# Patient Record
Sex: Female | Born: 1950 | Race: White | Hispanic: No | Marital: Married | Smoking: Former smoker
Health system: Southern US, Community
[De-identification: ages and names within clinical notes are randomized; demographics above are authoritative.]

## PROBLEM LIST (undated history)

## (undated) DIAGNOSIS — M8580 Other specified disorders of bone density and structure, unspecified site: Secondary | ICD-10-CM

## (undated) DIAGNOSIS — C50919 Malignant neoplasm of unspecified site of unspecified female breast: Secondary | ICD-10-CM

## (undated) DIAGNOSIS — I10 Essential (primary) hypertension: Secondary | ICD-10-CM

## (undated) DIAGNOSIS — C4491 Basal cell carcinoma of skin, unspecified: Secondary | ICD-10-CM

## (undated) DIAGNOSIS — E785 Hyperlipidemia, unspecified: Secondary | ICD-10-CM

## (undated) DIAGNOSIS — R011 Cardiac murmur, unspecified: Secondary | ICD-10-CM

## (undated) DIAGNOSIS — I2510 Atherosclerotic heart disease of native coronary artery without angina pectoris: Secondary | ICD-10-CM

## (undated) DIAGNOSIS — M47816 Spondylosis without myelopathy or radiculopathy, lumbar region: Secondary | ICD-10-CM

## (undated) HISTORY — PX: TONSILLECTOMY: SUR1361

## (undated) HISTORY — PX: ROTATOR CUFF REPAIR: SHX139

## (undated) HISTORY — DX: Basal cell carcinoma of skin, unspecified: C44.91

## (undated) HISTORY — PX: COLONOSCOPY: SHX174

## (undated) HISTORY — DX: Malignant neoplasm of unspecified site of unspecified female breast: C50.919

## (undated) HISTORY — DX: Spondylosis without myelopathy or radiculopathy, lumbar region: M47.816

## (undated) HISTORY — DX: Other specified disorders of bone density and structure, unspecified site: M85.80

## (undated) HISTORY — DX: Hyperlipidemia, unspecified: E78.5

## (undated) HISTORY — DX: Atherosclerotic heart disease of native coronary artery without angina pectoris: I25.10

## (undated) HISTORY — PX: CHOLECYSTECTOMY: SHX55

## (undated) HISTORY — DX: Essential (primary) hypertension: I10

## (undated) HISTORY — DX: Cardiac murmur, unspecified: R01.1

## (undated) HISTORY — PX: WISDOM TOOTH EXTRACTION: SHX21

---

## 1998-03-13 ENCOUNTER — Other Ambulatory Visit: Admission: RE | Admit: 1998-03-13 | Discharge: 1998-03-13 | Payer: Self-pay | Admitting: Obstetrics and Gynecology

## 1998-07-08 ENCOUNTER — Encounter: Payer: Self-pay | Admitting: Emergency Medicine

## 1998-07-08 ENCOUNTER — Encounter: Payer: Self-pay | Admitting: General Surgery

## 1998-07-09 ENCOUNTER — Inpatient Hospital Stay (HOSPITAL_COMMUNITY): Admission: EM | Admit: 1998-07-09 | Discharge: 1998-07-10 | Payer: Self-pay | Admitting: Emergency Medicine

## 1999-06-11 ENCOUNTER — Other Ambulatory Visit: Admission: RE | Admit: 1999-06-11 | Discharge: 1999-06-11 | Payer: Self-pay | Admitting: Obstetrics and Gynecology

## 2000-08-13 ENCOUNTER — Other Ambulatory Visit: Admission: RE | Admit: 2000-08-13 | Discharge: 2000-08-13 | Payer: Self-pay | Admitting: Obstetrics and Gynecology

## 2000-12-18 ENCOUNTER — Observation Stay (HOSPITAL_COMMUNITY): Admission: EM | Admit: 2000-12-18 | Discharge: 2000-12-19 | Payer: Self-pay | Admitting: Emergency Medicine

## 2000-12-18 ENCOUNTER — Encounter: Payer: Self-pay | Admitting: Emergency Medicine

## 2001-09-22 ENCOUNTER — Other Ambulatory Visit: Admission: RE | Admit: 2001-09-22 | Discharge: 2001-09-22 | Payer: Self-pay | Admitting: Obstetrics and Gynecology

## 2001-10-02 ENCOUNTER — Ambulatory Visit (HOSPITAL_COMMUNITY): Admission: RE | Admit: 2001-10-02 | Discharge: 2001-10-02 | Payer: Self-pay | Admitting: Orthopedic Surgery

## 2001-10-02 ENCOUNTER — Encounter: Payer: Self-pay | Admitting: Orthopedic Surgery

## 2001-12-11 ENCOUNTER — Encounter: Payer: Self-pay | Admitting: Orthopedic Surgery

## 2001-12-11 ENCOUNTER — Ambulatory Visit (HOSPITAL_COMMUNITY): Admission: RE | Admit: 2001-12-11 | Discharge: 2001-12-11 | Payer: Self-pay | Admitting: Orthopedic Surgery

## 2002-09-28 ENCOUNTER — Other Ambulatory Visit: Admission: RE | Admit: 2002-09-28 | Discharge: 2002-09-28 | Payer: Self-pay | Admitting: Obstetrics and Gynecology

## 2002-11-23 ENCOUNTER — Encounter: Payer: Self-pay | Admitting: Orthopedic Surgery

## 2002-11-23 ENCOUNTER — Ambulatory Visit (HOSPITAL_COMMUNITY): Admission: RE | Admit: 2002-11-23 | Discharge: 2002-11-23 | Payer: Self-pay | Admitting: Orthopedic Surgery

## 2003-10-14 ENCOUNTER — Other Ambulatory Visit: Admission: RE | Admit: 2003-10-14 | Discharge: 2003-10-14 | Payer: Self-pay | Admitting: Obstetrics and Gynecology

## 2004-08-08 ENCOUNTER — Ambulatory Visit: Payer: Self-pay | Admitting: Internal Medicine

## 2004-09-04 ENCOUNTER — Ambulatory Visit: Payer: Self-pay | Admitting: Internal Medicine

## 2004-09-17 ENCOUNTER — Ambulatory Visit: Payer: Self-pay | Admitting: Internal Medicine

## 2004-10-23 ENCOUNTER — Other Ambulatory Visit: Admission: RE | Admit: 2004-10-23 | Discharge: 2004-10-23 | Payer: Self-pay | Admitting: Obstetrics and Gynecology

## 2004-11-03 HISTORY — PX: MASTECTOMY: SHX3

## 2004-11-10 ENCOUNTER — Encounter: Admission: RE | Admit: 2004-11-10 | Discharge: 2004-11-10 | Payer: Self-pay | Admitting: Radiology

## 2004-11-30 ENCOUNTER — Encounter: Admission: RE | Admit: 2004-11-30 | Discharge: 2004-11-30 | Payer: Self-pay | Admitting: General Surgery

## 2004-12-03 ENCOUNTER — Ambulatory Visit (HOSPITAL_COMMUNITY): Admission: RE | Admit: 2004-12-03 | Discharge: 2004-12-03 | Payer: Self-pay | Admitting: General Surgery

## 2004-12-03 ENCOUNTER — Ambulatory Visit (HOSPITAL_BASED_OUTPATIENT_CLINIC_OR_DEPARTMENT_OTHER): Admission: RE | Admit: 2004-12-03 | Discharge: 2004-12-04 | Payer: Self-pay | Admitting: General Surgery

## 2004-12-03 ENCOUNTER — Encounter (INDEPENDENT_AMBULATORY_CARE_PROVIDER_SITE_OTHER): Payer: Self-pay | Admitting: Specialist

## 2004-12-05 ENCOUNTER — Ambulatory Visit: Payer: Self-pay | Admitting: Oncology

## 2005-03-06 ENCOUNTER — Ambulatory Visit: Payer: Self-pay | Admitting: Oncology

## 2005-05-17 ENCOUNTER — Ambulatory Visit (HOSPITAL_BASED_OUTPATIENT_CLINIC_OR_DEPARTMENT_OTHER): Admission: RE | Admit: 2005-05-17 | Discharge: 2005-05-17 | Payer: Self-pay | Admitting: Plastic Surgery

## 2005-07-09 ENCOUNTER — Ambulatory Visit: Payer: Self-pay | Admitting: Internal Medicine

## 2005-10-24 ENCOUNTER — Other Ambulatory Visit: Admission: RE | Admit: 2005-10-24 | Discharge: 2005-10-24 | Payer: Self-pay | Admitting: Obstetrics and Gynecology

## 2005-11-14 ENCOUNTER — Ambulatory Visit: Payer: Self-pay | Admitting: Internal Medicine

## 2006-09-18 DIAGNOSIS — I10 Essential (primary) hypertension: Secondary | ICD-10-CM

## 2006-09-22 ENCOUNTER — Ambulatory Visit: Payer: Self-pay | Admitting: Internal Medicine

## 2006-09-22 ENCOUNTER — Encounter: Payer: Self-pay | Admitting: Internal Medicine

## 2006-10-20 ENCOUNTER — Encounter: Payer: Self-pay | Admitting: Internal Medicine

## 2006-10-20 ENCOUNTER — Ambulatory Visit: Payer: Self-pay | Admitting: Cardiology

## 2006-10-27 ENCOUNTER — Other Ambulatory Visit: Admission: RE | Admit: 2006-10-27 | Discharge: 2006-10-27 | Payer: Self-pay | Admitting: Obstetrics and Gynecology

## 2006-10-29 ENCOUNTER — Encounter (INDEPENDENT_AMBULATORY_CARE_PROVIDER_SITE_OTHER): Payer: Self-pay | Admitting: *Deleted

## 2006-11-10 ENCOUNTER — Encounter: Payer: Self-pay | Admitting: Internal Medicine

## 2006-11-10 ENCOUNTER — Ambulatory Visit: Payer: Self-pay

## 2006-11-10 ENCOUNTER — Encounter: Payer: Self-pay | Admitting: Cardiology

## 2006-12-12 ENCOUNTER — Ambulatory Visit: Payer: Self-pay | Admitting: Cardiology

## 2006-12-12 LAB — CONVERTED CEMR LAB
ALT: 35 units/L (ref 0–35)
AST: 37 units/L (ref 0–37)
Bilirubin, Direct: 0.1 mg/dL (ref 0.0–0.3)
Total Bilirubin: 0.8 mg/dL (ref 0.3–1.2)
Total Protein: 6.6 g/dL (ref 6.0–8.3)

## 2006-12-23 ENCOUNTER — Ambulatory Visit: Payer: Self-pay | Admitting: Cardiology

## 2007-01-23 ENCOUNTER — Ambulatory Visit: Payer: Self-pay | Admitting: Internal Medicine

## 2007-01-30 ENCOUNTER — Telehealth (INDEPENDENT_AMBULATORY_CARE_PROVIDER_SITE_OTHER): Payer: Self-pay | Admitting: *Deleted

## 2007-02-02 ENCOUNTER — Ambulatory Visit: Payer: Self-pay | Admitting: Internal Medicine

## 2007-02-03 ENCOUNTER — Encounter (INDEPENDENT_AMBULATORY_CARE_PROVIDER_SITE_OTHER): Payer: Self-pay | Admitting: Family Medicine

## 2007-10-21 ENCOUNTER — Telehealth (INDEPENDENT_AMBULATORY_CARE_PROVIDER_SITE_OTHER): Payer: Self-pay | Admitting: *Deleted

## 2007-10-23 ENCOUNTER — Encounter: Payer: Self-pay | Admitting: Internal Medicine

## 2007-10-29 ENCOUNTER — Telehealth (INDEPENDENT_AMBULATORY_CARE_PROVIDER_SITE_OTHER): Payer: Self-pay | Admitting: *Deleted

## 2007-11-18 ENCOUNTER — Other Ambulatory Visit: Admission: RE | Admit: 2007-11-18 | Discharge: 2007-11-18 | Payer: Self-pay | Admitting: Obstetrics and Gynecology

## 2007-12-11 ENCOUNTER — Ambulatory Visit: Payer: Self-pay | Admitting: Cardiology

## 2007-12-11 LAB — CONVERTED CEMR LAB
ALT: 27 units/L (ref 0–35)
Alkaline Phosphatase: 55 units/L (ref 39–117)
BUN: 14 mg/dL (ref 6–23)
Bilirubin, Direct: 0.1 mg/dL (ref 0.0–0.3)
CO2: 28 meq/L (ref 19–32)
Calcium: 8.9 mg/dL (ref 8.4–10.5)
Direct LDL: 133.8 mg/dL
GFR calc Af Amer: 132 mL/min
HDL: 73.8 mg/dL (ref >39.0)
Potassium: 4 meq/L (ref 3.5–5.1)
Sodium: 142 meq/L (ref 135–145)
Total Bilirubin: 0.9 mg/dL (ref 0.3–1.2)
VLDL: 7 mg/dL (ref 0–40)

## 2007-12-18 ENCOUNTER — Ambulatory Visit: Payer: Self-pay | Admitting: Cardiology

## 2007-12-18 LAB — CONVERTED CEMR LAB: CRP, High Sensitivity: <1 — ABNORMAL LOW (ref 0.00–5.00)

## 2007-12-24 ENCOUNTER — Telehealth (INDEPENDENT_AMBULATORY_CARE_PROVIDER_SITE_OTHER): Payer: Self-pay | Admitting: *Deleted

## 2008-02-04 ENCOUNTER — Ambulatory Visit: Payer: Self-pay | Admitting: Internal Medicine

## 2008-02-04 DIAGNOSIS — Z853 Personal history of malignant neoplasm of breast: Secondary | ICD-10-CM

## 2008-02-04 DIAGNOSIS — E785 Hyperlipidemia, unspecified: Secondary | ICD-10-CM

## 2008-02-04 LAB — CONVERTED CEMR LAB
HDL goal, serum: 50 mg/dL
LDL Goal: 100 mg/dL

## 2008-03-11 ENCOUNTER — Ambulatory Visit: Payer: Self-pay | Admitting: Internal Medicine

## 2008-08-15 ENCOUNTER — Telehealth: Payer: Self-pay | Admitting: Internal Medicine

## 2008-10-13 ENCOUNTER — Telehealth (INDEPENDENT_AMBULATORY_CARE_PROVIDER_SITE_OTHER): Payer: Self-pay | Admitting: *Deleted

## 2008-11-01 ENCOUNTER — Encounter: Payer: Self-pay | Admitting: Internal Medicine

## 2008-11-15 ENCOUNTER — Telehealth: Payer: Self-pay | Admitting: Internal Medicine

## 2008-11-24 ENCOUNTER — Encounter (INDEPENDENT_AMBULATORY_CARE_PROVIDER_SITE_OTHER): Payer: Self-pay | Admitting: *Deleted

## 2008-12-28 ENCOUNTER — Ambulatory Visit: Payer: Self-pay | Admitting: Internal Medicine

## 2008-12-28 LAB — CONVERTED CEMR LAB
ALT: 27 units/L (ref 0–35)
AST: 33 units/L (ref 0–37)
Albumin: 4 g/dL (ref 3.5–5.2)
CO2: 30 meq/L (ref 19–32)
Creatinine, Ser: 0.7 mg/dL (ref 0.4–1.2)
Direct LDL: 175.4 mg/dL
Eosinophils Absolute: 0.2 10*3/uL (ref 0.0–0.7)
Eosinophils Relative: 2.7 % (ref 0.0–5.0)
GFR calc non Af Amer: 90.72 mL/min (ref >60)
Glucose, Bld: 108 mg/dL — ABNORMAL HIGH (ref 70–99)
Lymphocytes Relative: 38.1 % (ref 12.0–46.0)
Lymphs Abs: 2.2 10*3/uL (ref 0.7–4.0)
MCV: 94.6 fL (ref 78.0–100.0)
Neutro Abs: 2.9 10*3/uL (ref 1.4–7.7)
Platelets: 223 10*3/uL (ref 150.0–400.0)
RBC: 3.96 M/uL (ref 3.87–5.11)
RDW: 12.9 % (ref 11.5–14.6)
TSH: 1.72 microintl units/mL (ref 0.35–5.50)
Total CHOL/HDL Ratio: 3
Total Protein: 7.5 g/dL (ref 6.0–8.3)
Triglycerides: 42 mg/dL (ref 0.0–149.0)
VLDL: 8.4 mg/dL (ref 0.0–40.0)
WBC: 5.7 10*3/uL (ref 4.5–10.5)

## 2009-01-04 ENCOUNTER — Encounter: Payer: Self-pay | Admitting: Cardiology

## 2009-01-04 ENCOUNTER — Ambulatory Visit: Payer: Self-pay | Admitting: Internal Medicine

## 2009-01-04 DIAGNOSIS — R7309 Other abnormal glucose: Secondary | ICD-10-CM

## 2009-01-04 DIAGNOSIS — M47817 Spondylosis without myelopathy or radiculopathy, lumbosacral region: Secondary | ICD-10-CM

## 2009-01-04 DIAGNOSIS — M899 Disorder of bone, unspecified: Secondary | ICD-10-CM | POA: Insufficient documentation

## 2009-01-04 DIAGNOSIS — M949 Disorder of cartilage, unspecified: Secondary | ICD-10-CM

## 2009-01-04 DIAGNOSIS — Z85828 Personal history of other malignant neoplasm of skin: Secondary | ICD-10-CM

## 2009-01-05 ENCOUNTER — Encounter: Payer: Self-pay | Admitting: Cardiology

## 2009-01-05 ENCOUNTER — Ambulatory Visit: Payer: Self-pay | Admitting: Internal Medicine

## 2009-01-08 LAB — CONVERTED CEMR LAB: Hgb A1c MFr Bld: 5.8 % (ref 4.6–6.5)

## 2009-01-10 ENCOUNTER — Encounter (INDEPENDENT_AMBULATORY_CARE_PROVIDER_SITE_OTHER): Payer: Self-pay | Admitting: *Deleted

## 2009-01-10 ENCOUNTER — Ambulatory Visit: Payer: Self-pay | Admitting: Cardiology

## 2009-01-10 DIAGNOSIS — R011 Cardiac murmur, unspecified: Secondary | ICD-10-CM

## 2009-01-18 ENCOUNTER — Telehealth (INDEPENDENT_AMBULATORY_CARE_PROVIDER_SITE_OTHER): Payer: Self-pay | Admitting: *Deleted

## 2009-03-23 ENCOUNTER — Ambulatory Visit: Payer: Self-pay

## 2009-03-23 ENCOUNTER — Ambulatory Visit (HOSPITAL_COMMUNITY): Admission: RE | Admit: 2009-03-23 | Discharge: 2009-03-23 | Payer: Self-pay | Admitting: Cardiology

## 2009-03-23 ENCOUNTER — Ambulatory Visit: Payer: Self-pay | Admitting: Cardiology

## 2009-03-23 ENCOUNTER — Ambulatory Visit: Payer: Self-pay | Admitting: Internal Medicine

## 2009-03-23 ENCOUNTER — Encounter: Payer: Self-pay | Admitting: Cardiology

## 2009-03-31 ENCOUNTER — Encounter: Payer: Self-pay | Admitting: Cardiology

## 2009-03-31 LAB — CONVERTED CEMR LAB
HDL: 73.6 mg/dL (ref >39.00)
Total CHOL/HDL Ratio: 3
VLDL: 13.6 mg/dL (ref 0.0–40.0)

## 2009-04-03 ENCOUNTER — Telehealth: Payer: Self-pay | Admitting: Cardiology

## 2009-05-18 ENCOUNTER — Ambulatory Visit: Payer: Self-pay | Admitting: Internal Medicine

## 2009-11-01 ENCOUNTER — Encounter: Payer: Self-pay | Admitting: Internal Medicine

## 2009-11-09 ENCOUNTER — Encounter: Payer: Self-pay | Admitting: Internal Medicine

## 2009-12-15 ENCOUNTER — Telehealth (INDEPENDENT_AMBULATORY_CARE_PROVIDER_SITE_OTHER): Payer: Self-pay | Admitting: *Deleted

## 2010-01-01 ENCOUNTER — Encounter: Payer: Self-pay | Admitting: Internal Medicine

## 2010-01-01 ENCOUNTER — Encounter: Payer: Self-pay | Admitting: Cardiology

## 2010-01-22 ENCOUNTER — Ambulatory Visit: Payer: Self-pay | Admitting: Cardiology

## 2010-01-22 DIAGNOSIS — R002 Palpitations: Secondary | ICD-10-CM | POA: Insufficient documentation

## 2010-02-02 ENCOUNTER — Telehealth (INDEPENDENT_AMBULATORY_CARE_PROVIDER_SITE_OTHER): Payer: Self-pay | Admitting: *Deleted

## 2010-03-02 ENCOUNTER — Ambulatory Visit: Payer: Self-pay | Admitting: Cardiology

## 2010-04-02 ENCOUNTER — Telehealth: Payer: Self-pay | Admitting: Internal Medicine

## 2010-04-09 ENCOUNTER — Ambulatory Visit: Payer: Self-pay | Admitting: Internal Medicine

## 2010-04-09 LAB — CONVERTED CEMR LAB
ALT: 28 units/L (ref 0–35)
AST: 34 units/L (ref 0–37)
BUN: 13 mg/dL (ref 6–23)
Basophils Absolute: 0 10*3/uL (ref 0.0–0.1)
Bilirubin, Direct: 0.1 mg/dL (ref 0.0–0.3)
CO2: 29 meq/L (ref 19–32)
Calcium: 9.2 mg/dL (ref 8.4–10.5)
Eosinophils Relative: 2 % (ref 0.0–5.0)
Folate: 16.1 ng/mL
Hgb A1c MFr Bld: 5.7 % (ref 4.6–6.5)
Iron: 150 ug/dL — ABNORMAL HIGH (ref 42–145)
LDL Cholesterol: 106 mg/dL — ABNORMAL HIGH (ref 0–99)
MCHC: 34.5 g/dL (ref 30.0–36.0)
MCV: 94.4 fL (ref 78.0–100.0)
Magnesium: 1.9 mg/dL (ref 1.5–2.5)
Monocytes Absolute: 0.3 10*3/uL (ref 0.1–1.0)
Monocytes Relative: 6.3 % (ref 3.0–12.0)
Neutro Abs: 3.2 10*3/uL (ref 1.4–7.7)
Neutrophils Relative %: 62.8 % (ref 43.0–77.0)
RDW: 14.6 % (ref 11.5–14.6)
Saturation Ratios: 33.8 % (ref 20.0–50.0)
Sodium: 140 meq/L (ref 135–145)
TSH: 1.81 microintl units/mL (ref 0.35–5.50)
Total Bilirubin: 0.9 mg/dL (ref 0.3–1.2)
Transferrin: 316.7 mg/dL (ref 212.0–360.0)
Triglycerides: 50 mg/dL (ref 0.0–149.0)
VLDL: 10 mg/dL (ref 0.0–40.0)
Vitamin B-12: 392 pg/mL (ref 211–911)
WBC: 5.1 10*3/uL (ref 4.5–10.5)

## 2010-04-14 LAB — CONVERTED CEMR LAB: Vit D, 25-Hydroxy: 70 ng/mL (ref 30–89)

## 2010-04-16 ENCOUNTER — Ambulatory Visit: Payer: Self-pay | Admitting: Internal Medicine

## 2010-04-16 DIAGNOSIS — D649 Anemia, unspecified: Secondary | ICD-10-CM

## 2010-05-06 HISTORY — PX: TRANSESOPHAGEAL ECHOCARDIOGRAM: SHX273

## 2010-06-05 NOTE — Assessment & Plan Note (Signed)
Summary: yearly/sl  Medications Added PRAVACHOL 40 MG TABS (PRAVASTATIN SODIUM) 1 by mouth dialy      Allergies Added:   Visit Type:  Follow-up Primary Traci [name]:  Traci Melnick MD  CC:  Palpitations and presyncope.  History of Present Illness: The patient returns for yearly followup. She had an echocardiogram last year demonstrating some mild septal hypertrophy with systolic anterior mitral valve motion with mild regurgitation. Since that time she has continued to exercise aggressively. She says she feels great when she's doing this.She can do such things as bicycling without developing any significant dyspnea, neck or arm discomfort. She has no chest pressure. However, she has had multiple episodes of very brief sensation of presyncope. It is a sensation like the lights or flicking off and on. It happens only at rest. She's not had any syncope with that. She's not sure whether it correlates with the arrhythmias. She cannot bring this on. It was happening routinely for a while but hasn't happened in about 2 weeks.  Current Medications (verified): 1)  Benazepril Hcl 40 Mg  Tabs (Benazepril Hcl) .... Take One Tablet Daily 2)  Amlodipine Besylate 10 Mg  Tabs (Amlodipine Besylate) .... Take One Tablet Daily 3)  Pravachol 40 Mg Tabs (Pravastatin Sodium) .[name].. 1 By Mouth Dialy 4)  Vit D3 5000 .[name].. 1 By Mouth Once Daily 5)  Daily Vitamins  Tabs (Multiple Vitamin) .[name].. 1 By Mouth Once Daily 6)  Calcium 500 Mg Tabs (Calcium Carbonate) .[name].. 1 By Mouth Three Times A Day  Allergies (verified): 1)  ! * Sensitive To Pain Killers  Past History:  Past Medical History: Hypertension ASH(mild Septal Hypertrophy), 2D ECHO 2011, Dr Traci [name] Hyperlipidemia Breast cancer, hx of,Dr Traci [name] ,Dr  Traci [name] Lumbar Facet Syndrome, Dr Traci [name] Skin cancer, hx of, Basal Cell , Dr Traci [name]  Past Surgical History: Reviewed history from 01/04/2009 and no changes required. G 3 P 2;   Cholecystectomy Mastectomy Rotator cuff repair Colonoscopy X3 ,negative (last 2008, due 2013), Dr Traci [name]  Review of Systems       As stated in the HPI and negative for all other systems.   Vital Signs:  Patient profile:   60 year old female Height:      63 inches Weight:      143 pounds BMI:     25.42 Pulse rate:   60 / minute Resp:     16 per minute BP sitting:   132 / 77  (right arm)  Vitals Entered By: Marrion Coy, CNA (January 22, 2010 10:00 AM)  Physical Exam  General:  Well developed, well nourished, in no acute distress. Head:  normocephalic and atraumatic Eyes:  PERRLA/EOM intact; conjunctiva and lids normal. Neck:  Neck supple, no JVD. No masses, thyromegaly or abnormal cervical nodes. Chest Wall:  no deformities Lungs:  Clear bilaterally to auscultation and percussion.   Detailed Cardiovascular Exam  Neck    Carotids: Carotids full and equal bilaterally without bruits.      Neck Veins: Normal, no JVD.    Heart    Inspection: no deformities or lifts noted.      Palpation: normal PMI with no thrills palpable.      Auscultation: Sand S2 within normal limits, no S3, no S4, 2/6 apical systolic murmur radiating slightly at the aortic outflow tract, no diastolic murmurs  Vascular    Abdominal Aorta: no palpable masses, pulsations, or audible bruits.      Femoral Pulses: normal femoral pulses bilaterally.  Pedal Pulses: normal pedal pulses bilaterally.      Radial Pulses: normal radial pulses bilaterally.      Peripheral Circulation: no clubbing, cyanosis, or edema noted with normal capillary refill.     EKG  Procedure date:  01/22/2010  Findings:      Sinus rhythm, rate 58, axis within normal limits, intervals within normal limits, no acute ST-T wave changes  Impression & Recommendations:  Problem # 1:  PALPITATIONS (ICD-785.1) The patient is describing symptoms as above. I will apply a 21 day event monitor to try to see if there is any  correlation with arrhythmia.  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 However, she does have the complaints of some episodes of very brief presyncope. It is prudent to put her on a treadmill to see if I can induce any arrhythmias and in particular to see her blood pressure response as a followup to her cardiomyopathy. At this point I do not plan further imaging this year.  Problem # 3:  MURMUR (ICD-785.2) This is unchanged even with dynamic maneuvers. Orders: Treadmill (Treadmill) Event (Event)  Other Orders: EKG w/ Interpretation (93000)  Patient Instructions: 1)  Your physician has recommended that you wear an event monitor.  Event monitors are medical devices that record the heart's electrical activity. Doctors most often use these monitors to diagnose arrhythmias. Arrhythmias are problems with the speed or rhythm of the heartbeat. The monitor is a small, portable device. You can wear one while you do your normal daily activities. This is usually used to diagnose what is causing palpitations/syncope (passing out). 2)  Your physician has requested that you have an exercise tolerance test.  For further information please visit [email or web address]  Please also follow instruction sheet, as given.

## 2010-06-05 NOTE — Progress Notes (Signed)
Summary: lab & cpx 1211   Phone Note Call from Patient   Summary of Call: patient is scheduled for cpx 811914 - lab at [facility] 120511 @ 8:00 need lab order  Initial call taken by: Okey Regal Spring,  December 15, 2009 4:44 PM  Follow-up for Phone Call        Lipid,Hep,BMP, TSH,CBCD, A1c, Vit D, Stool Cards v70.0/272.4/995.20/401.9/733.90 Follow-up by: Shonna Chock CMA,  December 15, 2009 4:45 PM  Additional Follow-up for Phone Call Additional follow up Details #1::        lab order added to appt .Marland KitchenOkey Regal Spring  December 18, 2009 9:00 AM

## 2010-06-05 NOTE — Progress Notes (Signed)
Summary: labs request  Phone Note Call from Patient Call back at Home Phone [phone] Call back at Work Phone [phone]   Summary of Call: Patient is requesting vitamin and mineral levels be checked along with her CPX labs. (vit A, vit C, magnesium, folic acid, etc) Please advise. Initial call taken by: Lucious Groves CMA,  April 02, 2010 10:01 AM  Follow-up for Phone Call        in addition to V70.o fasting labs;Magnesium , vit D would be covered by Codes : 785.1 & 733.90. There are no vit C & A assays commercially available. Folate is drawn if anemia is present, otherwise managed care will not cover it. Follow-up by: [name] MD,  April 02, 2010 5:55 PM  Additional Follow-up for Phone Call Additional follow up Details #1::        Patient notified. Lucious Groves CMA  April 03, 2010 9:46 AM

## 2010-06-05 NOTE — Progress Notes (Signed)
Summary: Event monitor  Phone Note Outgoing Call Call back at [facility] Phone [phone]   Call placed by: Stanton Kidney, EMT-P,  February 02, 2010 1:32 PM Call placed to: Patient Action Taken: Phone Call Completed Summary of Call: s/w pt, will talk to schedulers and have GXT same day as Event monitor.

## 2010-06-07 NOTE — Assessment & Plan Note (Signed)
Summary: cpx/cbs   Vital Signs:  Patient profile:   60 year old female Height:      63 inches Weight:      142.4 pounds BMI:     25.32 Temp:     97.8 degrees F oral Pulse rate:   60 / minute Resp:     14 per minute BP sitting:   120 / 72  (right arm) Cuff size:   regular  Vitals Entered By: Shonna Chock CMA (April 16, 2010 8:35 AM)    Primary Care Provider:  [name] MD   History of Present Illness:    Traci [name] is here for a physical; she is asymptomatic. Hyperlipidemia Follow-Up:      The patient denies muscle aches, GI upset, abdominal pain, flushing, itching, constipation, diarrhea, and fatigue.  The patient denies the following symptoms: chest pain/pressure, exercise intolerance, dypsnea, palpitations, syncope, and pedal edema.  Compliance with medications (by patient report) has been near 100%.  Dietary compliance has been excellent.  The patient reports exercising daily.   Hypertension Follow-Up:      .  The patient reports urinary frequency  but  she drinks fluids liberally. She denies lightheadedness, headaches, and rash.  Compliance with medications (by patient report) has been near 100%.  Adjunctive measures currently used by the patient include modified  salt restriction.  BP @ home 120s or < / <85.  Lipid Management History:      Positive NCEP/ATP III risk factors include female age 67 years old or older and hypertension.  Negative NCEP/ATP III risk factors include no history of early menopause without estrogen hormone replacement, non-diabetic, HDL cholesterol greater than 60, no family history for ischemic heart disease, non-tobacco-user status, no ASHD (atherosclerotic heart disease), no prior stroke/TIA, no peripheral vascular disease, and no history of aortic aneurysm.     Current Medications (verified): 1)  Benazepril Hcl 40 Mg  Tabs (Benazepril Hcl) .... Take One Tablet Daily 2)  Amlodipine Besylate 10 Mg  Tabs (Amlodipine Besylate) .... Take One Tablet  Daily 3)  Pravachol 40 Mg Tabs (Pravastatin Sodium) .[name].. 1 By Mouth Dialy 4)  Vit D3 5000 .[name].. 1 By Mouth Once Daily 5)  Daily Vitamins  Tabs (Multiple Vitamin) .[name].. 1 By Mouth Once Daily 6)  Calcium 500 Mg Tabs (Calcium Carbonate) .[name].. 1 By Mouth Three Times A Day  Allergies: 1)  ! * Sensitive To Pain Killers  Past History:  Past Medical History: Hyperlipidemia: NMR Lipoprofile 2010: LDL 171(1667/ 546), HDL 65, TG 92.LDL = < 140, ideally < 100. Framingham Study LDL  goal = < 160. ASH(mild Septal Hypertrophy), 2D ECHO 2011, Dr Leta Jungling  Hochrein Hypertension Breast cancer, PMH  of,Dr Francina Ames ,Dr  Donnie Coffin Lumbar Facet Syndrome, Dr Charlett Blake Skin cancer, PMH  of, Basal Cell , Dr Mayford Knife Osteopenia: T score -2.4 @ spine 10/2008  Past Surgical History: G 3 P 2;  Cholecystectomy Mastectomy  Rotator cuff repair, Dr Teressa Senter Colonoscopy X3 ,negative (last 2008, due 2013), Dr  Lina Sar  Family History: Father: colon cancer ,COAD, CAD, prostate cancer  Mother: HTN,lung  cancer ,CAD Siblings: sister:  HTN,lipids; MGF : cancer  ?  lung   Social History: Married Alcohol use-yes:socailly Regular exercise-yes, high level CVE :walks 7 X/week & gym Retired Former Smoker:quit 1980 Heart Healthy Diet  Review of Systems  The patient denies anorexia, fever, weight loss, weight gain, vision loss, decreased hearing, hoarseness, prolonged cough, hemoptysis, melena, hematochezia, severe indigestion/heartburn, hematuria, suspicious skin lesions,  depression, unusual weight change, abnormal bleeding, enlarged lymph nodes, and angioedema.         BMD values improved 10/2009; she'll have report sent from [facility].  Physical Exam  General:  well-nourished;alert,appropriate and cooperative throughout examination Head:  Normocephalic and atraumatic without obvious abnormalities. No apparent alopecia or balding. Eyes:  No corneal or conjunctival inflammation noted.  Perrla. Funduscopic exam benign,  without hemorrhages, exudates or papilledema.  Ears:  External ear exam shows no significant lesions or deformities.  Otoscopic examination reveals clear canals, tympanic membranes are intact bilaterally without bulging, retraction, inflammation or discharge. Hearing is grossly normal bilaterally. Nose:  External nasal examination shows no deformity or inflammation. Nasal mucosa are pink and moist without lesions or exudates. Mouth:  Oral mucosa and oropharynx without lesions or exudates.  Teeth in good repair. Neck:  No deformities, masses, or tenderness noted. Lungs:  Normal respiratory effort, chest expands symmetrically. Lungs are clear to auscultation, no crackles or wheezes. Heart:  normal rate, regular rhythm, no gallop, no rub, no JVD, no HJR, and grade 1 /6 systolic murmur LSB.   Abdomen:  Bowel sounds positive,abdomen soft and non-tender without masses, organomegaly or hernias noted. Aorta palpable w/o AAA Genitalia:  Dr Tresa Res Msk:  No deformity or scoliosis noted of thoracic or lumbar spine.   Pulses:  R and L carotid,radial,dorsalis pedis and posterior tibial pulses are full and equal bilaterally Extremities:  No clubbing, cyanosis, edema, or deformity noted with normal full range of motion of all joints.   Neurologic:  alert & oriented X3 and DTRs symmetrical and normal.   Skin:  Intact without suspicious lesions or rashes Cervical Nodes:  No lymphadenopathy noted Axillary Nodes:  No palpable lymphadenopathy Psych:  memory intact for recent and remote, normally interactive, and good eye contact.     Impression & Recommendations:  Problem # 1:  ROUTINE GENERAL MEDICAL EXAM@HEALTH  CARE FACL (ICD-V70.0)  Problem # 2:  HYPERLIPIDEMIA (ICD-272.4) Lipids are @ goal Her updated medication list for this problem includes:    Pravachol 40 Mg Tabs (Pravastatin sodium) .[name]... 1 by mouth dialy  Problem # 3:  HYPERTENSION (ICD-401.9) BP controlled Her updated medication list for this  problem includes:    Benazepril Hcl 40 Mg Tabs (Benazepril hcl) .[name]... Take one tablet daily    Amlodipine Besylate 10 Mg Tabs (Amlodipine besylate) .[name]... Take one tablet daily  Problem # 4:  OSTEOPENIA (ICD-733.90) T score improved by report  Problem # 5:  ANEMIA, MILD (ICD-285.9) HCT 35; supranormal iron level & normal B12 & folate  Complete Medication List: 1)  Benazepril Hcl 40 Mg Tabs (Benazepril hcl) .... Take one tablet daily 2)  Amlodipine Besylate 10 Mg Tabs (Amlodipine besylate) .... Take one tablet daily 3)  Pravachol 40 Mg Tabs (Pravastatin sodium) .[name].. 1 by mouth dialy 4)  Vit D3 5000  .Marland KitchenMarland Kitchen. 1 by mouth once daily 5)  Daily Vitamins Tabs (Multiple vitamin) .[name].. 1 by mouth once daily 6)  Calcium 500 Mg Tabs (Calcium carbonate) .[name].. 1 by mouth three times a day  Lipid Assessment/Plan:      Based on NCEP/ATP III, the patient's risk factor category is "0-1 risk factors".  The patient's lipid goals are as follows: Total cholesterol goal is 200; LDL cholesterol goal is 140; HDL cholesterol goal is 50; Triglyceride goal is 150.  Her LDL cholesterol goal has not been met.  Secondary causes for hyperlipidemia have been ruled out.  She has been counseled on adjunctive measures for lowering  her cholesterol and has been provided with dietary instructions.    Patient Instructions: 1)  Please schedule a follow-up Lab  appointment in 3 months for  2)  CBC w/ Diff (285.9). 3)  Take an  81 mg coated Aspirin every day. 4)  Check your Blood Pressure regularly. If it is above: 135/85 on AVERAGE  you should make an appointment. Prescriptions: PRAVACHOL 40 MG TABS (PRAVASTATIN SODIUM) 1 by mouth dialy  #90 x 3   Entered and Authorized by:   [name] MD   Signed by:   [name] MD on 04/16/2010   Method used:   Print then Give to Patient   RxID:   [id number] AMLODIPINE BESYLATE 10 MG  TABS (AMLODIPINE BESYLATE) take one tablet daily  #90 Tablet x 3   Entered and Authorized by:    [name] MD   Signed by:   [name] MD on 04/16/2010   Method used:   Print then Give to Patient   RxID:   [id number] BENAZEPRIL HCL 40 MG  TABS (BENAZEPRIL HCL) take one tablet daily  #90 Tablet x 3   Entered and Authorized by:   [name] MD   Signed by:   [name] MD on 04/16/2010   Method used:   Print then Give to Patient   RxID:   [id number]    Orders Added: 1)  Est. Patient 40-64 years [[id number]]

## 2010-08-24 ENCOUNTER — Other Ambulatory Visit: Payer: Self-pay | Admitting: Internal Medicine

## 2010-09-18 NOTE — Assessment & Plan Note (Signed)
[facility]                            CARDIOLOGY OFFICE NOTE   NAME:[name], [name]                        MRN:          [id number]  DATE:12/23/2006                            DOB:          [birth year]    PRIMARY CARE PHYSICIAN:  Dr. Alwyn Ren.   REASON FOR VISIT:  Evaluate patient with hypertension and dyslipidemia.   HISTORY OF PRESENT ILLNESS:  The patient returns for followup. She has a  heart murmur and did have an echocardiogram which demonstrates some mild  aortic thickening and some mild focal basal septal hypertrophy but no  other significant abnormalities. She has been started on Pravastatin at  the last appointment. She had an LDL of 155 at that time. It is now down  to 115. Her HDL is 70.5 which is down somewhat from the 90 it was  before. She is exercising and doing well. She denies any chest pain or  shortness of breath. She had no palpitations, presyncope or syncope.   PAST MEDICAL HISTORY:  Dyslipidemia, hypertension, breast cancer status  post mastectomy 2006, rotator cuff surgery, cholecystectomy.   ALLERGIES:  PAIN MEDICATIONS.   MEDICATIONS:  1. Pravastatin 40 mg daily.  2. Benazepril 40 mg daily.  3. Amlodipine 10 mg daily.   REVIEW OF SYSTEMS:  As stated in the HPI and otherwise negative for  other systems.   PHYSICAL EXAMINATION:  GENERAL:  The patient is in no distress.  VITAL SIGNS:  Blood pressure 128/78, heart rate 60 and regular.  HEENT:  Eyelids unremarkable. Pupils equal round and reactive to light.  Fundi not visualized. Oral mucosa unremarkable.  NECK:  No jugular venous distention, wave form within normal limits.  Carotid upstroke brisk and symmetric, no bruits, transmitted systolic  murmur.  LYMPHATICS:  No cervical, axillary or inguinal adenopathy.  LUNGS:  Clear to auscultation bilaterally.  BACK:  No costovertebral angle tenderness.  CHEST:  Unremarkable.  HEART:  PMI not displaced or sustained, S1 and S2  within normal limits,  no S3, no S4, 2/6 apical systolic murmur radiating out the aortic  outflow tract and early peaking. No diastolic murmurs.  ABDOMEN:  Flat, positive bowel sounds, normal in frequency and pitch, no  bruits, no rebound, no guarding, no midline pulsatile mass, no  organomegaly.  SKIN:  No rashes, no nodules.  EXTREMITIES:  2+ pulses, no edema.   ASSESSMENT/PLAN:  1. Dyslipidemia. We had a long discussion (greater than half of this      appointment) about management. I would continue her on the      Pravastatin for an aggressive primary risk approach. She agrees      with this. She will remain on this current dose. Will check lipids      again in a year.  2. Hypertension. Blood pressure is now better controlled. She is going      to continue her blood pressure taking it a couple times a week. She      will remain on the regimen as listed. She will let me know if it  goes up in the future.  3. Heart murmur. The patient has mild aortic valve thickening which is      probably the cause of her murmur moreso than the septal      hypertrophy. However, this can be followed clinically with repeat      echo in the years to come.  4. Followup. Will see her back in 1 year or sooner if needed.     Rollene Rotunda, MD, [facility]  Electronically Signed    JH/MedQ  DD: 12/23/2006  DT: 12/24/2006  Job #: [id number]   cc:   Titus Dubin. Alwyn Ren, MD,FACP,FCCP

## 2010-09-18 NOTE — Assessment & Plan Note (Signed)
[facility]                            CARDIOLOGY OFFICE NOTE   NAME:Traci [name], Traci [name]                        MRN:          [id number]  DATE:10/20/2006                            DOB:          1950-05-09    REASON FOR CONSULTATION:  Evaluate patient with hypertension,  dyslipidemia.   HISTORY OF PRESENT ILLNESS:  The patient is a lovely 60 year old [place]  female with longstanding hypertension.  Her past cardiac workup has  included a Cardiolite in 1999 and 2002 which was negative for any  evidence of ischemia with a well-preserved ejection fraction.  She had  an echocardiogram in this office in 2002 which suggested some mild  aortic sclerosis and moderate left ventricular hypertrophy.  She has had  difficult to control hypertension for 8 years.  She goes to [facility]  in [place] and does get apparently executive physicals.  She recently  had a CT calcium score which was 0.  She also had other evaluation to  include labs demonstrating a slightly elevated C-reactive protein.  Her  cholesterol demonstrates a total 260, HDL 94, LDL 155.  Her vitamin D  level was low at 26.   The patient actually is quite active.  She exercises routinely.  She  gets her heart rate into the 140s-150s.  She denies any chest  discomfort, neck discomfort, arm discomfort, activity induced nausea,  vomiting, and excessive diaphoresis.  She has no palpitations,  presyncope, or syncope.  She denies any PND or orthopnea.   PAST MEDICAL HISTORY:  1. Hypertension.  2. Dyslipidemia.  3. Breast cancer.   PAST SURGICAL HISTORY:  1. Status post left mastectomy 2006.  2. Rotator cuff surgery.  3. Cholecystectomy.   ALLERGIES:  PAIN MEDICATIONS.   MEDICATIONS:  1. Benazepril 20 mg daily (she was prescribed 40 mg and took this only      for a couple of days and said it made her feel funny though she      could not qualify or quantify this).  2. Amlodipine 10 mg daily.  3.  Vitamin D.  4. Multivitamin.   SOCIAL HISTORY:  The patient is the owner of [employer].  She  is married.  She has 2 children and has 79 month old twin grandsons.  She drinks a little Shippey wine.  She was a one pack per day smoker for 7  years but quit in 1975.   FAMILY HISTORY:  Noncontributory for early coronary artery disease.   REVIEW OF SYSTEMS:  Positive for occasional palpitations or feeling like  her heart is beating hard.  Negative for all other systems.   PHYSICAL EXAMINATION:  The patient is well-appearing and in no distress.  Blood pressure 164/100, heart rate 64 and regular, weight 143 pounds,  body mass index 23.  HEENT:  Eyelids unremarkable, pupils equally round and reactive to  light, fundi within normal limits, oral mucosa unremarkable.  NECK:  No jugular venous distention at 45 degrees, carotid upstroke  brisk and symmetric, bilateral transmitted systolic murmur versus  bruits, no thyromegaly.  LYMPHATICS:  No cervical, axillary, inguinal adenopathy.  LUNGS:  Clear to auscultation and percussion bilaterally.  BACK:  No costovertebral angle tenderness.  CHEST:  Unremarkable.  HEART:  PMI not displaced or sustained, S1 and S2 within normal limits,  no S3, no S4, 2/6 apical systolic murmur radiating slightly out the  aortic outflow tract, no diastolic murmurs.  ABDOMEN:  Flat, positive bowel sounds normal in frequency and pitch, no  bruits, no rebound, no guarding, no midline pulsatile mass, no  hepatomegaly, splenomegaly.  SKIN:  No rashes, no nodules.  EXTREMITIES:  With 2+ pulses throughout, no edema, no cyanosis, no  clubbing.  NEURO:  Oriented to person, place, and time; cranial nerves II-XII  grossly intact, motor grossly intact.   EKG sinus rhythm, rate 64, axis within normal limits, intervals within  normal limits, no acute ST wave changes.   ASSESSMENT/PLAN:  1. Hypertension.  I think this is the patient's most significant      problem.  She  had end organ involvement in 2002 on her      echocardiogram.  I have encouraged her to go ahead and take the 40      mg of Benazepril.  She can try this for a month and keep a blood      pressure check.  I might consider a low dose spironolactone which      has been recommended for refractory hypertension.  She and I can      discuss this or she can review it with Dr. Alwyn Ren going forward.  I      think this is her most pressing health need at this point.  Goal      would be in the 130s/80s.  2. Dyslipidemia.  This is a difficult issue.  She had a 0 calcium      score.  She understands that there are no data to suggest no      mortality over morbidity advantage with statins in this situation.      However, given her risk factors and the LDL that at times runs in      the 170s, I do favor the use of a statin as the easiest to take      with the fewest side effects.  I have prescribed pravastatin 40 mg      daily.  This is in addition to her already rigid lifestyle changes.      She can have her statins followed closely by Dr. Alwyn Ren going      forward.  I would like to at least see her LDL less than 130.  3. Aortic sclerosis.  The patient has a systolic murmur that I do not      suspect is more than sclerosis.  However, it has been 6 years since      her last echocardiogram.  We need the echo to evaluate this as well      as quantify her left ventricular hypertrophy.  4. Carotid bruits.  I suspect this is most likely transmitted systolic      murmur, though can not exclude carotid bruits.  She will get      carotid Doppler.  5. Followup.  I can see this patient back in a few months to re-      discuss primary risk reduction and I will also be following up the      results of the testing above.  She will follow closely with Dr.  Hopper for continued management of her risk factors.     Rollene Rotunda, MD, [facility]  Electronically Signed    JH/MedQ  DD: 10/20/2006  DT: 10/21/2006  Job  #: [id number]   cc:   Titus Dubin. Alwyn Ren, MD,FACP,FCCP

## 2010-09-18 NOTE — Assessment & Plan Note (Signed)
[facility]                            CARDIOLOGY OFFICE NOTE   NAME:Alcock, BRAILEE RIEDE                        MRN:          [id number]  DATE:12/18/2007                            DOB:          08/30/1950    PRIMARY CARE PHYSICIAN:  Titus Dubin. Alwyn Ren, MD, FACP, FCCP   REASON FOR PRESENTATION:  Evaluated the patient with hypertension,  dyslipidemia, and mild septal hypertrophy.   HISTORY OF PRESENT ILLNESS:  The patient is almost 60 years old.  I saw  her last year for evaluation of the above.  Since that time, she has  done quite well.  She has had no new chest discomfort, neck, or arm  discomfort.  She has had no new shortness of breath and denies any PND  or orthopnea.  She has had no palpitations, presyncope, or syncope.  She  exercises routinely doing aerobic exercises.  We have reviewed her  lipids.  I suggested she take pravastatin last year.  She did for a  while and her LDL came down to 155-115.  However, she stopped taking and  has now only been taking it sporadically since then.  Her most recent  LDL is 133.  However, her HDL is 73.8.  Her total cholesterol is 219  with triglycerides of 35.  She keeps her blood pressure at home and has  been well controlled in the 120s/70s, though elevated today.  She has  had no further cardiovascular studies or evaluation since then.   PAST MEDICAL HISTORY:  1. Hypertension.  2. Dyslipidemia.  3. Mild aortic valve thickening.  4. Mild septal hypertrophy.  5. Left ventricular hypertrophy.  6. Breast cancer, status post mastectomy in 2006.  7. Rotator cuff surgery.  8. Cholecystectomy.   ALLERGIES:  Pain medications.   MEDICATIONS:  1. Pravastatin 40 mg daily.  2. Benazepril 40 mg daily.  3. Amlodipine 10 mg daily.  4. Vitamin D.  5. Multivitamin.  6. Calcium.   REVIEW OF SYSTEMS:  As stated in the HPI and otherwise negative for  other systems.   PHYSICAL EXAMINATION:  GENERAL:  The patient is  in no distress.  VITAL SIGNS:  Blood pressure is 150/86, heart rate 60 and regular,  weight 147 pounds, and body mass index 23.  HEENT:  Eyelids are unremarkable; pupils equal, round, and reactive to  light; fundi not visualized; oral mucosa unremarkable.  NECK:  No jugular venous distention at 45 degrees; carotid upstroke  brisk and symmetric; no bruits, no thyromegaly.  LYMPHATICS:  No cervical, axillary, or inguinal adenopathy.  LUNGS:  Clear to auscultation bilaterally.  BACK:  No costovertebral angle tenderness.  CHEST:  Unremarkable.  HEART:  PMI not displaced or sustained; S1 and S2 within normal limits;  no S3, no S4; 2/6 apical systolic murmur radiating slightly out the  aortic outflow tract; no diastolic murmurs.  ABDOMEN:  Flat; positive bowel sounds; normal frequency and pitch; no  bruits, rebound, guarding or midline pulsatile mass; no hepatomegaly, no  splenomegaly.  SKIN:  No rashes, no nodules.  EXTREMITIES:  Pulse 2+; no  edema, no cyanosis, no clubbing.  NEURO:  Oriented to person, place, and time; cranial nerves II-XII  grossly intact; motor grossly intact.   EKG, sinus rhythm, rate 56, axis within normal limits, intervals within  normal limits, and no acute ST-T wave changes.   ASSESSMENT AND PLAN:  1. Hyperlipidemia.  We had a long discussion again about this.  My      plan is to check a high sensitivity C-reactive protein and then to      discuss with her the indication for statin.  (Greater than half-an-      hour at this appointment).  2. Hypertension.  Blood pressure is elevated.  However, she says that      taking it at home is much better.  If she relaxed through this      appointment, we rechecked it, she went down into the 120s.  Her      blood pressure cuff at home is accurate.  Therefore, I have      suggested no change to her regimen.  3. Heart murmur.  The patient does have some mild aortic valve      thickening.  She has some septal hypertrophy.   The murmur is not      dynamic, so I think it is related to the valve thickening.  It has      not changed by description since last year.  There are no new      symptoms.  Therefore, we will follow this clinically and with      repeat echos as indicated.  4. Followup.  I will see her back in 1 year or sooner if needed.      Rollene Rotunda, MD, [facility]  Electronically Signed    JH/MedQ  DD: 12/18/2007  DT: 12/19/2007  Job #: [id number]   cc:   Titus Dubin. Alwyn Ren, MD,FACP,FCCP

## 2010-09-21 NOTE — Op Note (Signed)
NAME:  Traci [name], Traci [name]                 ACCOUNT NO.:  [id number]   MEDICAL RECORD NO.:  [id number]          PATIENT TYPE:  AMB   LOCATION:  DSC                          FACILITY:  [facility]   PHYSICIAN:  Alfredia Ferguson, M.D.  DATE OF BIRTH:  1951-05-04   DATE OF PROCEDURE:  12/03/2004  DATE OF DISCHARGE:                                 OPERATIVE REPORT   PREOPERATIVE DIAGNOSES:  1.  Left breast carcinoma.  2.  Contralateral breast macromastia.   POSTOPERATIVE DIAGNOSES:  1.  Left breast carcinoma.  2.  Contralateral breast macromastia.   OPERATION PERFORMED:  1.  Placement of left subpectoral tissue expander, 500 mL inflated with 50      mL of saline.  2.  Right breast reduction removing 196 grams.   SURGEON:  Dr. Benna Dunks.   ANESTHESIA:  General laryngeal mask anesthesia.   INDICATIONS FOR SURGERY:  This is a 60 year old woman with a recent  diagnosis of left breast carcinoma.  She is undergoing left mastectomy and  wishes to undergo immediate breast reconstruction.  Potential risk of this  reconstruction include capsular contracture, exposure of the tissue  expander, wound healing difficulties, infection, bleeding, hematoma, seroma,  rupture of the tissue expander, and overall dissatisfaction with the  results.  On the opposite breast, the patient has large pendulous breasts.  This will be nearly impossible to match her contralateral breast with the  tissue expander, so the patient wishes to undergo a small breast reduction.  She understands the risk on the right side including unsightly scaring, over  reduction, under reduction, bleeding, infection, hematoma, seroma, numbness  of the nipple, unsightly scarring, and overall dissatisfaction of the  results.  In spite of these, the patient wishes to proceed.   DESCRIPTION OF OPERATION:  Skin marks were placed on the right breast for  the reduction using a Wise skin pattern for an inferior pedicle technique.  On the left side skin  marks were also placed outlining the natural  dimensions of the breast that will be removed.  The patient was then taken  to the OR by Dr. Francina Ames, who carried out a left total mastectomy and  sentinel node biopsy.  Following completion of the mastectomy, I was  summoned to the OR.  Inspection of the mastectomy site revealed that there  was absence of the pectoralis muscle medially and inferiorly.  I came to the  conclusion that if I place the tissue expander there would be no muscle  coverage in the medial inferior portion of the tissue expander.  In spite of  that, the remainder of the muscle was in good condition.  I opted to  proceed.  Appropriately a 6 cm incision was made in the direction of the  pectoralis muscle fibers and a subpectoral space was entered.  Using a  combination of electrocautery and gentle blunt dissection, a subpectoral  pocket was created.  Laterally the serratus anterior muscle was dissected  off its origin in order to gain some muscular coverage laterally.  Medially  as described above, there was no muscle coverage  due to absence of the  muscle.  Inferiorly I dissected down to the inframammary crease and  terminated the dissection.  The pocket was copiously irrigated with warm  saline irrigation.  A 10 millimeter Blake drain was placed in the lateral  axillary gutter from the mastectomy dissection, but no drain was placed in  the subpectoral position.  An Inamed 500 mL tissue expander was prepared by  evacuating the air.  The expander was placed in the desired position and  inflated with 50 mL of sterile saline.  The incision in the muscle was  closed with interrupted horizontal mattresses of 3-0 Vicryl suture.  The  entire wound was now copiously irrigated with saline irrigation.  Once  hemostasis had been assured, the wound was closed with multiple interrupted  3-0 Monocryl sutures for the dermis, followed by a running 3-0 Monocryl  subcuticular for the  skin edges.  The left side was cleansed and dried and  Steri-Strips were applied, and the incision was covered.  Attention was now  directed to the right breast.  An 8 cm wide inferiorly based pedicle was  marked and a 42 millimeter diameter circle was drawn around the nipple.  This circle was incised as was the pedicle, and within the confines of this  incision the pedicle was deepithelialized.  The pedicle was dissected away  from the surrounding breast tissue cutting on the bias away from the  midclavicular line to ensure adequate connections to the chest wall.  Superiorly it was dissected away from the breast tissue, again cutting on  the bias in a superior direction.  The remainder of my incisions were made  in the breast including the inframammary crease.  The medial, superior and  lateral breast flaps were now elevated off the pectoralis fascia.  Excess  dermoglandular tissue was dissected away from the medial flap, thinning this  flap to approximately 3-4 cm in thickness.  Electrocautery dissection was  used to carry this out.  The central breast flap excess tissue was also  excised, thinning this flap to approximately 4 cm in thickness.  Laterally  the excess dermoglandular tissue was also dissected away from the lateral  flap thinning this flap to approximately 2 cm in thickness.  The excess  dermoglandular tissue was now submitted for weights.  A total of 196 grams  of tissue was removed.  The flaps were irrigated and meticulous hemostasis  was accomplished.  The superior edge of my pedicle was now tacked down to  the superior limits of my dissection in the mid portion of the dissection.  It was tacked to the pectoralis fascia.  The inferior corner of the medial  and lateral breast flaps were now united to the mid portion of the  inframammary crease with a 2-0 Vicryl suture.  My vertical incision was closed with interrupted 3-0 Monocryl sutures.  My inframammary crease  incision  was closed with multiple interrupted 3-0 Monocryl sutures, followed  by a running 3-0 Monocryl intracuticular suture.  At the point 4.5 cm up on  the vertical line, away from the inframammary crease, a mark was placed.  Using a 38 millimeter template, a circle was drawn with the bottom part of  this circle being at the 4.5 cm mark.  This circle was incised and the skin,  and a small amount of breast tissue was removed creating a new location for  the nipple areolar complex.  The nipple areolar complex was brought through  this opening  and fixed in position with interrupted 3-0 Monocryl sutures for  the dermis followed by a running 4-0 Monocryl intracuticular for the skin  edges.  The vascularity of the nipple was excellent.  The patient's chest  was now cleansed and dried.  Steri-Strips were applied to the breast  reduction side.  Bulky dressings were placed over both the breast reduction  side and the mastectomy side.  A very loose wrap with a 6 inch ACE bandage  was placed to secure the dressing in order to avoid placing any tape on the  patient.  The patient was awakened, extubated and transported to the  recovery room.  Estimated blood loss was less than 200 mL.      Alfredia Ferguson, M.D.  Electronically Signed     WBB/MEDQ  D:  12/03/2004  T:  12/03/2004  Job:  [id number]

## 2010-09-21 NOTE — Discharge Summary (Signed)
[facility]  Patient:    Traci [name], Traci [name]                                  Visit Number: [id number] MRN: [id number]          Service Type: MED Location: [phone] Attending:  Rogelia Boga Dictator:   Cornell Barman, P.A. Adm. Date:  [id number] Disc. Date: [id number]   CC:         Titus Dubin. Alwyn Ren, M.D. [facility]   Discharge Summary  DISCHARGE DIAGNOSES: 1. Chest pain. 2. Hypertension.  BRIEF ADMISSION HISTORY:  [name] is a 60 year old [place] female who presented with acute onset of chest pain.  The patient worked out on the morning of admission and then went to eat breakfast.  As she was leaving breakfast, she developed a heavy left chest pain.  She stated that the pain was over her left breast and possibly in her left arm.  She described it as being heavy and achy.  She denied any nausea, shortness of breath, or diaphoresis.  The patient drove home and checked her blood pressure and found it to be 180/112.  The patient was feeling quite anxious at that time.  She did take a baby aspirin.  She had forgotten to take her Lotrel on the day before and took her blood pressure medicine at that time.  The patient was concerned about this pain and drove herself to the emergency room.  The pain lasted 30 minutes.  She denies any prior history of heartburn or indigestion. She did state that the pain seemed to be worse with inspiration.  The patient denies any exertional chest pain and does note that she exercises at least four times a week doing a cardiovascular work-up and has never had pain.  The patient also describes at least four episodes of waking up with significant chest pain in the night.  She states that this is relieved with movement and feels almost like a cramping pain.  CARDIAC RISK FACTORS:  Hypertension.  No diabetes.  No hypercholesterolemia. No tobacco history.  No family history.  PAST MEDICAL HISTORY: 1. Hypertension. 2.  Status post cholecystectomy.  HOSPITAL COURSE:  CHEST PAIN:  The patient presents with what sounds like atypical chest pain with risk factor only for hypertension.  Initial cardiac enzymes were negative.  EKG was without ischemia.  The patient does describe a treadmill stress test within the past five years that was reportedly negative. The patient was admitted to rule out myocardial infarction.  The patients serial cardiac enzymes were negative.  EKG again was without ischemia.  She did rule out for myocardial infarction.  The patient will be discharged home. We have scheduled her for a Persantine Cardiolite on January 02, 2001, at 8 a.m. with the results to be forwarded to Titus Dubin. Alwyn Ren, M.D.  LABORATORY DATA AT DISCHARGE:  The fasting lipid profile is still pending.  MEDICATIONS AT DISCHARGE:  Lotrel 5/10 mg daily.  She is to resume her other medications as at home.  FOLLOW-UP:  January 02, 2001, at 8 a.m. for a Persantine Cardiolite.  The patient is then to follow up with Titus Dubin. Alwyn Ren, M.D., about one week after the stress test. DD:  12/19/00 TD:  12/19/00 Job: [id number] XL/[id number]

## 2010-09-21 NOTE — Op Note (Signed)
NAME:  Traci [name], Traci [name]                 ACCOUNT NO.:  [id number]   MEDICAL RECORD NO.:  [id number]          PATIENT TYPE:  AMB   LOCATION:  DSC                          FACILITY:  [facility]   PHYSICIAN:  Alfredia Ferguson, M.D.  DATE OF BIRTH:  1950-08-01   DATE OF PROCEDURE:  05/17/2005  DATE OF DISCHARGE:                                 OPERATIVE REPORT   PREOPERATIVE DIAGNOSES:  1.  History of breast cancer.  2.  Acquired absence of left breast.  3.  Acquired absence of left nipple.   POSTOPERATIVE DIAGNOSES:  1.  History of breast cancer.  2.  Acquired absence of left breast.  3.  Acquired absence of left nipple.   OPERATION:  Left nipple reconstruction with tripartite flap.   SURGEON:  Alfredia Ferguson, M.D.   ANESTHESIA:  1% Xylocaine plain.   INDICATIONS FOR SURGERY:  This is a 54-year woman who is undergone  mastectomy for breast cancer and reconstruction with a breast implant. She  is now ready for nipple reconstruction. She understands the risk of nipple  reconstruction including failure of the nipple due to vascular compromise,  malposition of the nipple and overall dissatisfaction with the result. In  spite of that, she wishes to proceed.   DESCRIPTION OF OPERATION:  With the patient sitting position, the location  of the nipple was chosen based on the location of the natural nipple. The  patient was then placed in a supine position and a 42 mm diameter circle was  drawn around the position of the nipple. Within the confines of this circle,  a tripartite flap was drawn with the base of the flap laterally placed. The  three points of the flap pointed to the 12 o'clock, 6 o'clock and 9 o'clock  position. A 1% Xylocaine plain was infiltrated around the area of the  reconstruction and the left reconstructed breast was prepped with Betadine  and draped with sterile drapes. An incision was made in the tripartite flap  and the flap was elevated the level of the subcutaneous tissue.  An  approximately 2 cm skin bridge was left laterally for vascularity. The two  flaps pointing to the 12 o'clock and 6 o'clock position were rolled towards  one another and intertwined as two hands in prayer. They were fixed to one  other with interrupted 4-0 chromic suture. The flap which was pointing to  the 9 o'clock position was closed down on top of this created cylinder and  fixed in position with 4-0 chromic  suture. The donor site was closed with interrupted 4-0 PDS in the dermis and  interrupted 4-0 chromic for the skin edges. The patient tolerated the  procedure well. The vascularity of the flap appeared to be excellent at the  conclusion. The area was cleansed and dried and bulky dressings placed over  the nipple.      Alfredia Ferguson, M.D.  Electronically Signed     WBB/MEDQ  D:  05/17/2005  T:  05/18/2005  Job:  [id number]

## 2010-09-21 NOTE — Op Note (Signed)
NAME:  Estupinan, Meranda                 ACCOUNT NO.:  [id number]   MEDICAL RECORD NO.:  [id number]          PATIENT TYPE:  AMB   LOCATION:  DSC                          FACILITY:  [facility]   PHYSICIAN:  Rose Phi. Maple Hudson, M.D.   DATE OF BIRTH:  11/07/1950   DATE OF PROCEDURE:  12/03/2004  DATE OF DISCHARGE:                                 OPERATIVE REPORT   PREOPERATIVE DIAGNOSIS:  Ductal carcinoma in situ of the left breast.   POSTOPERATIVE DIAGNOSIS:  Ductal carcinoma in situ of the left breast.   OPERATION:  1.  Blue dye injection.  2.  Left partial mastectomy and sentinel lymph node biopsy.  3.  A reconstruction by Dr. Benna Dunks dictated in another note.   SURGEON:  Rose Phi. Maple Hudson, M.D.   ANESTHESIA:  General.   OPERATIVE PROCEDURE:  Prior to coming to the operating room, 1 mCi of  technetium sulfur colloid was injected around the periareolar area,  intradermally.   After suitable general anesthesia was induced, the patient was placed in  supine position with the arms extended on the arm board. A 5 mL mixture of 2  mL of methylene blue and 3 mL of injectable saline was injected in the  subareolar breast tissue and the breast gently massaged for three minutes.   We then prepped and draped out both sides that had been previously marked by  Dr. Benna Dunks of plastic surgery.   A short transverse left axillary incision was made with dissection through  subcutaneous tissue to the clavipectoral fascia. Deep to the fascia was a  blue and hot lymph node with an adjacent node to it and they were both  removed as sentinel nodes. There were no other blue, palpable or hot nodes.   While I was being evaluated, transverse elliptical incisions incorporating  the nipple-areolar complex as previously marked had been made and then we  dissected the flaps superiorly to the clavicle medially to the medial border  of the sternum inferiorly to the inframammary fold at the rectus fascia and  laterally to  latissimus dorsi muscle. We then removed the breast by  dissecting from medial to lateral trying to preserve some of the pectoralis  fascia since she had noninvasive disease. As we approached the margin of the  pectoralis major muscle, it was reported that the sentinel nodes were  negative for metastatic disease. We then completed the mastectomy in the low  left axilla removing the breast and orienting it for the pathologist.   Again, we had good hemostasis which we obtained with the cautery.   Dr. Delia Chimes then came in to do a tissue expander reconstruction along  with a reduction and mastopexy on the opposite side. That will be dictated  in separate note.       PRY/MEDQ  D:  12/03/2004  T:  12/03/2004  Job:  [id number]

## 2010-11-18 ENCOUNTER — Emergency Department (HOSPITAL_COMMUNITY)
Admission: EM | Admit: 2010-11-18 | Discharge: 2010-11-18 | Disposition: A | Discharge status: Home / Self Care | Payer: BC Managed Care – PPO | Attending: Emergency Medicine | Admitting: Emergency Medicine

## 2010-11-18 ENCOUNTER — Emergency Department (HOSPITAL_COMMUNITY): Payer: BC Managed Care – PPO

## 2010-11-18 DIAGNOSIS — R002 Palpitations: Secondary | ICD-10-CM | POA: Insufficient documentation

## 2010-11-18 DIAGNOSIS — I4949 Other premature depolarization: Secondary | ICD-10-CM | POA: Insufficient documentation

## 2010-11-18 DIAGNOSIS — I10 Essential (primary) hypertension: Secondary | ICD-10-CM | POA: Insufficient documentation

## 2010-11-18 LAB — COMPREHENSIVE METABOLIC PANEL
BUN: 12 mg/dL (ref 6–23)
CO2: 27 mEq/L (ref 19–32)
Calcium: 9.4 mg/dL (ref 8.4–10.5)
Creatinine, Ser: 0.53 mg/dL (ref 0.50–1.10)
GFR calc Af Amer: >60 mL/min (ref >60)
GFR calc non Af Amer: >60 mL/min (ref >60)
Glucose, Bld: 94 mg/dL (ref 70–99)

## 2010-11-18 LAB — TROPONIN I: Troponin I: <0.3 ng/mL (ref <0.30)

## 2010-11-18 LAB — CBC
MCH: 30.9 pg (ref 26.0–34.0)
Platelets: 255 10*3/uL (ref 150–400)
RDW: 13.4 % (ref 11.5–15.5)

## 2010-11-18 LAB — T4, FREE: Free T4: 0.91 ng/dL (ref 0.80–1.80)

## 2010-11-18 LAB — CK TOTAL AND CKMB (NOT AT ARMC): Total CK: 151 U/L (ref 7–177)

## 2010-11-18 LAB — TSH: TSH: 1.273 u[IU]/mL (ref 0.350–4.500)

## 2010-11-21 ENCOUNTER — Encounter: Payer: Self-pay | Admitting: Cardiology

## 2010-11-22 ENCOUNTER — Ambulatory Visit (INDEPENDENT_AMBULATORY_CARE_PROVIDER_SITE_OTHER): Payer: BC Managed Care – PPO | Admitting: Cardiology

## 2010-11-22 ENCOUNTER — Encounter: Payer: Self-pay | Admitting: Cardiology

## 2010-11-22 DIAGNOSIS — I10 Essential (primary) hypertension: Secondary | ICD-10-CM

## 2010-11-22 DIAGNOSIS — R002 Palpitations: Secondary | ICD-10-CM

## 2010-11-22 DIAGNOSIS — I421 Obstructive hypertrophic cardiomyopathy: Secondary | ICD-10-CM

## 2010-11-22 DIAGNOSIS — E785 Hyperlipidemia, unspecified: Secondary | ICD-10-CM

## 2010-11-22 NOTE — Assessment & Plan Note (Signed)
The blood pressure is at target. No change in medications is indicated. We will continue with therapeutic lifestyle changes (TLC).  

## 2010-11-22 NOTE — Assessment & Plan Note (Signed)
She would like to have another screening coronary calcium score and I will try to help arrange this. She will continue with risk reduction with lipids as per her primary physician.

## 2010-11-22 NOTE — Assessment & Plan Note (Signed)
I will followup echocardiogram to further evaluate this.

## 2010-11-22 NOTE — Progress Notes (Signed)
HPI The patient presents for evaluation of palpitations. She has had a couple of episodes of rapid heartbeat. Recently the beach after bending over a couple of times she would describe lightheadedness. She said this happened despite what she thought was adequate hydration. She had no presyncope or syncope with this. A few days ago while driving home she developed some rapid heart rate that was different than her usual ectopy. This persisted for about an hour. She wasn't able to take her heart rate. She did feel lightheaded. It eventually stopped on its own. She had no further symptoms for a day. However, Sunday morning while shopping she sneezed and suddenly had sustained tachypalpitations. This lasted for many minutes. She did go to the emergency room but was in sinus rhythm by that time. There were no arrhythmias or EKG abnormalities noted. She wasn't having any chest discomfort, neck or arm discomfort. I reviewed the records chest x-ray was normal. Enzymes were normal. Electrolytes were normal. Since that time she has felt well and has had no further symptoms. She is quite physically active and she denies any new shortness of breath, PND or orthopnea.  Allergies  Allergen Reactions  . Hydrocodone   . Vicodin (Hydrocodone-Acetaminophen)     Current Outpatient Prescriptions  Medication Sig Dispense Refill  . amLODipine (NORVASC) 10 MG tablet TAKE 1 TABLET EVERY DAY  90 tablet  1  . benazepril (LOTENSIN) 40 MG tablet TAKE 1 TABLET EVERY DAY  90 tablet  1  . Calcium-Magnesium-Vitamin D (CALCIUM MAGNESIUM PO) Take 1,000 Units by mouth daily.        . Cholecalciferol (VITAMIN D) 1000 UNITS capsule Take 5,000 Units by mouth daily.        . pravastatin (PRAVACHOL) 20 MG tablet Take 20 mg by mouth daily.          Past Medical History  Diagnosis Date  . Hypertension   . ASHD (arteriosclerotic heart disease)     Mild septal hypertrophy, 2D ECHO 11/2006, Dr. Antoine Poche  . Hyperlipidemia   . Breast cancer      Hx of, Dr. Francina Ames, Dr. Donnie Coffin  . Facet syndrome, lumbar     Dr. Charlett Blake  . Skin cancer, basal cell     Dr. Mayford Knife    Past Surgical History  Procedure Date  . Cholecystectomy   . Mastectomy   . Rotator cuff repair   . Colonoscopy     x3 negative (last 2008, due 2013), Dr. Juanda Chance  . Transesophageal echocardiogram     Overall left ventricular systolic function was normal. Left ventricular ejection fraction was estimated, range being 55% to 60%. There were no left ventricular regional wall motional abnormalities. Left ventricular wall thickness was mildly increased. There was mild focal basal septal hypertrophy.    ROS:  As stated in the HPI and negative for all other systems.  PHYSICAL EXAM BP 120/58  Pulse 64  Ht 5\' 2"  (1.575 m)  Wt 144 lb (65.318 kg)  BMI 26.34 kg/m2 GENERAL:  Well appearing HEENT:  Pupils equal round and reactive, fundi not visualized, oral mucosa unremarkable NECK:  No jugular venous distention, waveform within normal limits, carotid upstroke brisk and symmetric, no bruits, no thyromegaly LYMPHATICS:  No cervical, inguinal adenopathy LUNGS:  Clear to auscultation bilaterally BACK:  No CVA tenderness CHEST:  Unremarkable HEART:  PMI not displaced or sustained,S1 and S2 within normal limits, no S3, no S4, no clicks, no rubs, apical systolic murmur early peaking and radiating out the outflow  tract.  Slight increase with Valsva. ABD:  Flat, positive bowel sounds normal in frequency in pitch, no bruits, no rebound, no guarding, no midline pulsatile mass, no hepatomegaly, no splenomegaly EXT:  2 plus pulses throughout, no edema, no cyanosis no clubbing SKIN:  No rashes no nodules NEURO:  Cranial nerves II through XII grossly intact, motor grossly intact throughout PSYCH:  Cognitively intact, oriented to person place and time   EKG:  11/18/10  Sinus rhythm, premature ectopic complexes, no acute ST T-wave changes  ASSESSMENT AND PLAN

## 2010-11-22 NOTE — Assessment & Plan Note (Signed)
She may have an SVT.  We discussed vagal maneuvers.  At this point these are infrequent enough that I don't suspect an event monitor would be helpful. She will let me know obese seems to be frequent enough that she would want to wear a monitor.

## 2010-11-22 NOTE — Patient Instructions (Signed)
Your physician has requested that you have an echocardiogram. Echocardiography is a painless test that uses sound waves to create images of your heart. It provides your doctor with information about the size and shape of your heart and how well your heart's chambers and valves are working. This procedure takes approximately one hour. There are no restrictions for this procedure.  You will scheduled for a Coronary Calcium Score.  The CPT code for this test is [phone] if you would like to call your insurance company to see how much they will cover on this testing.  You can call [phone] to ask about the hospital charge related to the testing.  We will request insurance precertification for you and schedule the appointment for you once this is obtained.  Please follow up with Dr Antoine Poche in 3 months at the [address] [facility] [employer].

## 2010-11-23 ENCOUNTER — Encounter: Payer: Self-pay | Admitting: *Deleted

## 2010-11-26 ENCOUNTER — Encounter: Payer: Self-pay | Admitting: Internal Medicine

## 2010-11-29 ENCOUNTER — Ambulatory Visit (HOSPITAL_COMMUNITY): Payer: BC Managed Care – PPO | Attending: Cardiology | Admitting: Radiology

## 2010-11-29 DIAGNOSIS — C50919 Malignant neoplasm of unspecified site of unspecified female breast: Secondary | ICD-10-CM | POA: Insufficient documentation

## 2010-11-29 DIAGNOSIS — I059 Rheumatic mitral valve disease, unspecified: Secondary | ICD-10-CM | POA: Insufficient documentation

## 2010-11-29 DIAGNOSIS — I10 Essential (primary) hypertension: Secondary | ICD-10-CM | POA: Insufficient documentation

## 2010-11-29 DIAGNOSIS — E785 Hyperlipidemia, unspecified: Secondary | ICD-10-CM | POA: Insufficient documentation

## 2010-11-29 DIAGNOSIS — I379 Nonrheumatic pulmonary valve disorder, unspecified: Secondary | ICD-10-CM | POA: Insufficient documentation

## 2010-11-29 DIAGNOSIS — I421 Obstructive hypertrophic cardiomyopathy: Secondary | ICD-10-CM | POA: Insufficient documentation

## 2010-11-29 DIAGNOSIS — R002 Palpitations: Secondary | ICD-10-CM

## 2010-12-04 ENCOUNTER — Telehealth: Payer: Self-pay | Admitting: Cardiology

## 2010-12-04 NOTE — Telephone Encounter (Signed)
Per pt call, pt was calling to set up date and time to schedule CAT scan per Dr. Jenene Slicker orders. Please return pt call to advise/discuss.

## 2010-12-04 NOTE — Telephone Encounter (Signed)
Pt returned your call from earlier.  Call her [phone]

## 2010-12-04 NOTE — Telephone Encounter (Signed)
Left message to call back  

## 2010-12-04 NOTE — Telephone Encounter (Signed)
Spoke with pt who states when she was here for echo recently she was told insurance had approved her to have CT. She would like to schedule.  I told pt I would have Pam (Dr. Jenene Slicker nurse) call her tomorrow when she is back in office to schedule.

## 2010-12-05 ENCOUNTER — Encounter: Payer: Self-pay | Admitting: Cardiology

## 2010-12-05 NOTE — Telephone Encounter (Signed)
Pt scheduled for 8/14 at 1pm

## 2010-12-07 ENCOUNTER — Encounter: Payer: Self-pay | Admitting: *Deleted

## 2010-12-07 ENCOUNTER — Telehealth: Payer: Self-pay | Admitting: Cardiology

## 2010-12-07 NOTE — Telephone Encounter (Signed)
Aware pt is scheduled for a  CT/Calcium score on 8/14

## 2010-12-07 NOTE — Telephone Encounter (Signed)
Per pt call, pt has received the information on her scheduled test. Pt said she has received something in the mail. Pt said she is going out of town for a week and pt does not need anyone to call her back. Pt just wanted to make sure that appropriate nurse has received her scheduled test information and knows that she is scheduled for test on August 14th. If you need pt for anything else you can return call.

## 2010-12-18 ENCOUNTER — Ambulatory Visit (HOSPITAL_COMMUNITY)
Admission: RE | Admit: 2010-12-18 | Discharge: 2010-12-18 | Disposition: A | Discharge status: Home / Self Care | Payer: BC Managed Care – PPO | Source: Ambulatory Visit | Attending: Cardiology | Admitting: Cardiology

## 2010-12-18 ENCOUNTER — Encounter (HOSPITAL_COMMUNITY): Payer: Self-pay

## 2010-12-18 DIAGNOSIS — R079 Chest pain, unspecified: Secondary | ICD-10-CM

## 2010-12-18 DIAGNOSIS — I10 Essential (primary) hypertension: Secondary | ICD-10-CM

## 2010-12-18 DIAGNOSIS — R42 Dizziness and giddiness: Secondary | ICD-10-CM | POA: Insufficient documentation

## 2010-12-18 MED ORDER — IOHEXOL 350 MG/ML SOLN
80.0000 mL | Freq: Once | INTRAVENOUS | Status: AC | PRN
  Administered 2010-12-18: 80 mL via INTRAVENOUS

## 2011-02-26 ENCOUNTER — Other Ambulatory Visit: Payer: Self-pay | Admitting: Internal Medicine

## 2011-02-26 ENCOUNTER — Ambulatory Visit: Payer: BC Managed Care – PPO | Admitting: Cardiology

## 2011-02-26 NOTE — Telephone Encounter (Signed)
**  Patient needs to schedule a CPX 04/2011**

## 2011-02-27 ENCOUNTER — Other Ambulatory Visit: Payer: Self-pay

## 2011-03-05 ENCOUNTER — Ambulatory Visit (INDEPENDENT_AMBULATORY_CARE_PROVIDER_SITE_OTHER): Payer: BC Managed Care – PPO | Admitting: Cardiology

## 2011-03-05 ENCOUNTER — Encounter: Payer: Self-pay | Admitting: Cardiology

## 2011-03-05 VITALS — BP 139/86 | HR 56 | Ht 63.0 in | Wt 145.0 lb

## 2011-03-05 DIAGNOSIS — R002 Palpitations: Secondary | ICD-10-CM

## 2011-03-05 DIAGNOSIS — E785 Hyperlipidemia, unspecified: Secondary | ICD-10-CM

## 2011-03-05 DIAGNOSIS — I10 Essential (primary) hypertension: Secondary | ICD-10-CM

## 2011-03-05 DIAGNOSIS — I421 Obstructive hypertrophic cardiomyopathy: Secondary | ICD-10-CM

## 2011-03-05 NOTE — Assessment & Plan Note (Signed)
We had a long discussion about this. Her last LDL was 127 on pravastatin. After our conversation she elects to continue with this medication.

## 2011-03-05 NOTE — Assessment & Plan Note (Signed)
This will be followed clinically. No change in therapy is indicated. I will consider further imaging in the future based on physical examination and symptoms.

## 2011-03-05 NOTE — Assessment & Plan Note (Signed)
Her blood pressure is upper limits of normal. She will keep and I on this. No change in therapy is indicated today.

## 2011-03-05 NOTE — Progress Notes (Signed)
HPI The patient presents for evaluation of palpitations. When I last saw her she had had some severe episodes of tachycardia palpitations. However, since that time she's had none of these. She did have a workup that included CT angiography of her coronary arteries. This demonstrated a calcium score of 0 and normal coronaries. Echocardiography to followup her septal hypertrophy was unchanged from previous with well-preserved ejection fraction. She has some systolic anterior motion but no significant regurgitation. She has been active and she denies any chest pressure, neck or arm discomfort. She has no shortness of breath, PND or orthopnea. She has no weight gain or edema.  Allergies  Allergen Reactions  . Hydrocodone   . Vicodin (Hydrocodone-Acetaminophen)     Current Outpatient Prescriptions  Medication Sig Dispense Refill  . amLODipine (NORVASC) 10 MG tablet TAKE 1 TABLET EVERY DAY  90 tablet  0  . benazepril (LOTENSIN) 40 MG tablet TAKE 1 TABLET EVERY DAY  90 tablet  0  . Calcium-Magnesium-Vitamin D (CALCIUM MAGNESIUM PO) Take 1,000 Units by mouth daily.        . Cholecalciferol (VITAMIN D) 1000 UNITS capsule Take 5,000 Units by mouth daily.        . pravastatin (PRAVACHOL) 20 MG tablet Take 20 mg by mouth daily.          Past Medical History  Diagnosis Date  . Hypertension   . ASHD (arteriosclerotic heart disease)     Mild septal hypertrophy, 2D ECHO 11/2006, Dr. Antoine Poche  . Hyperlipidemia   . Breast cancer     Hx of, Dr. Francina Ames, Dr. Donnie Coffin  . Facet syndrome, lumbar     Dr. Charlett Blake  . Skin cancer, basal cell     Dr. Mayford Knife    Past Surgical History  Procedure Date  . Cholecystectomy   . Mastectomy   . Rotator cuff repair   . Colonoscopy     x3 negative (last 2008, due 2013), Dr. Juanda Chance  . Transesophageal echocardiogram     Overall left ventricular systolic function was normal. Left ventricular ejection fraction was estimated, range being 55% to 60%. There were no left  ventricular regional wall motional abnormalities. Left ventricular wall thickness was mildly increased. There was mild focal basal septal hypertrophy.    ROS:  As stated in the HPI and negative for all other systems.  PHYSICAL EXAM BP 139/86  Pulse 56  Ht 5\' 3"  (1.6 m)  Wt 145 lb (65.772 kg)  BMI 25.69 kg/m2 GENERAL:  Well appearing HEENT:  Pupils equal round and reactive, fundi not visualized, oral mucosa unremarkable NECK:  No jugular venous distention, waveform within normal limits, carotid upstroke brisk and symmetric, no bruits, no thyromegaly LYMPHATICS:  No cervical, inguinal adenopathy LUNGS:  Clear to auscultation bilaterally BACK:  No CVA tenderness CHEST:  Unremarkable HEART:  PMI not displaced or sustained,S1 and S2 within normal limits, no S3, no S4, no clicks, no rubs, apical systolic murmur early peaking and radiating out the outflow tract.  Slight increase with Valsva. ABD:  Flat, positive bowel sounds normal in frequency in pitch, no bruits, no rebound, no guarding, no midline pulsatile mass, no hepatomegaly, no splenomegaly EXT:  2 plus pulses throughout, no edema, no cyanosis no clubbing SKIN:  No rashes no nodules NEURO:  Cranial nerves II through XII grossly intact, motor grossly intact throughout PSYCH:  Cognitively intact, oriented to person place and time   ASSESSMENT AND PLAN

## 2011-03-05 NOTE — Patient Instructions (Signed)
Please follow up in 11/2011 with Dr Antoine Poche  The current medical regimen is effective;  continue present plan and medications.

## 2011-03-05 NOTE — Assessment & Plan Note (Signed)
She has had no further tachycardia palpitations. No further evaluation is indicated.

## 2011-03-15 ENCOUNTER — Other Ambulatory Visit: Payer: Self-pay | Admitting: *Deleted

## 2011-03-15 MED ORDER — PRAVASTATIN SODIUM 20 MG PO TABS: 20.0000 mg | ORAL_TABLET | Freq: Every day | ORAL | Status: DC

## 2011-06-17 ENCOUNTER — Other Ambulatory Visit: Payer: Self-pay | Admitting: Internal Medicine

## 2011-06-17 NOTE — Telephone Encounter (Signed)
Patient needs to schedule a CPX  

## 2011-08-13 ENCOUNTER — Other Ambulatory Visit: Payer: Self-pay | Admitting: Cardiology

## 2011-08-13 NOTE — Telephone Encounter (Signed)
..   Requested Prescriptions   Pending Prescriptions Disp Refills  . pravastatin (PRAVACHOL) 40 MG tablet [Pharmacy Med Name: PRAVASTATIN SODIUM 40 MG TAB] 30 tablet 4    Sig: TAKE 1 TABLET BY MOUTH AT BEDTIME

## 2011-09-16 ENCOUNTER — Other Ambulatory Visit: Payer: Self-pay | Admitting: Internal Medicine

## 2011-09-17 NOTE — Telephone Encounter (Signed)
Left message on voicemail informing patient she is overdue for an appointment. Patient needs to schedule a CPX

## 2011-11-25 ENCOUNTER — Encounter: Payer: BC Managed Care – PPO | Admitting: Internal Medicine

## 2011-12-03 ENCOUNTER — Encounter: Payer: Self-pay | Admitting: Internal Medicine

## 2011-12-12 ENCOUNTER — Telehealth: Payer: Self-pay | Admitting: *Deleted

## 2011-12-12 ENCOUNTER — Ambulatory Visit (INDEPENDENT_AMBULATORY_CARE_PROVIDER_SITE_OTHER): Payer: BC Managed Care – PPO | Admitting: Internal Medicine

## 2011-12-12 ENCOUNTER — Encounter: Payer: Self-pay | Admitting: Internal Medicine

## 2011-12-12 VITALS — BP 116/70 | HR 65 | Temp 97.7°F | Resp 12 | Ht 62.08 in | Wt 143.0 lb

## 2011-12-12 DIAGNOSIS — R7309 Other abnormal glucose: Secondary | ICD-10-CM

## 2011-12-12 DIAGNOSIS — E785 Hyperlipidemia, unspecified: Secondary | ICD-10-CM

## 2011-12-12 DIAGNOSIS — Z0000 Encounter for general adult medical examination without abnormal findings: Secondary | ICD-10-CM

## 2011-12-12 MED ORDER — BENAZEPRIL HCL 40 MG PO TABS: 40.0000 mg | ORAL_TABLET | Freq: Every day | ORAL | Status: DC

## 2011-12-12 MED ORDER — AMLODIPINE BESYLATE 10 MG PO TABS: 10.0000 mg | ORAL_TABLET | Freq: Every day | ORAL | Status: DC

## 2011-12-12 NOTE — Telephone Encounter (Signed)
LMOVM for pt to return call 

## 2011-12-12 NOTE — Telephone Encounter (Signed)
Pt left msg stating that she forgot to have you tell her the best way to remove wax out of her ears. Please advise.

## 2011-12-12 NOTE — Addendum Note (Signed)
Addended by: Maurice Small on: 12/12/2011 02:56 PM   Modules accepted: Orders

## 2011-12-12 NOTE — Progress Notes (Signed)
Subjective:    Patient ID: Traci [name], female    DOB: 1951/02/23, 61 y.o.   MRN: [id number]  HPI  Traci [name] is here for a physical; she denies acute issues.      Review of Systems HYPERTENSION: Disease Monitoring: Blood pressure range-120s/< 80  Chest pain, palpitations-no       Dyspnea- no Medications: Compliance- yes  Lightheadedness,Syncope- no    Edema- no  FASTING HYPERGLYCEMIA, PMH of:   FH DM: both parents;PGF Polyuria/phagia/dipsia- no      Visual problems- occasional blurred vision; Ophth exam 10/13  HYPERLIPIDEMIA: Disease Monitoring: See symptoms for Hypertension Medications: Compliance- usually 20 mg pravastatin but on 10 mg several weeks  Abd pain, bowel changes- no  Muscle aches- no Diet: heart healthy Exercise:CVE> 60 min 6 days/ week           Objective:   Physical Exam  Gen.: Healthy and well-nourished in appearance. Alert, appropriate and cooperative throughout exam. Head: Normocephalic without obvious abnormalities  Eyes: No corneal or conjunctival inflammation noted. Pupils equal round reactive to light and accommodation. Fundal exam is benign without hemorrhages, exudate, papilledema. Extraocular motion intact. Vision grossly normal. Ears: External  ear exam reveals no significant lesions or deformities. Canals clear .TMs normal. Hearing is grossly normal bilaterally. Nose: External nasal exam reveals no deformity or inflammation. Nasal mucosa are pink and moist. No lesions or exudates noted.  Mouth: Oral mucosa and oropharynx reveal no lesions or exudates. Teeth in good repair. Neck: No deformities, masses, or tenderness noted. Range of motion & Thyroid normal. Lungs: Normal respiratory effort; chest expands symmetrically. Lungs are clear to auscultation without rales, wheezes, or increased work of breathing. Heart: Normal rate and rhythm. Normal S1 ;accentuated S2. No gallop, click, or rub. Grade 1/6 systolic murmur  Abdomen: Bowel sounds normal;  abdomen soft and nontender. No masses, organomegaly or hernias noted.Aorta palpable ; no AAA  Genitalia: Shirlyn Goltz, NP                                                                          Musculoskeletal/extremities: No deformity or scoliosis noted of  the thoracic or lumbar spine. No clubbing, cyanosis, edema, or deformity noted. Range of motion  normal .Tone & strength  normal.Joints normal. Nail health  good. Vascular: Carotid, radial artery, dorsalis pedis and  posterior tibial pulses are full and equal. No bruits present. Neurologic: Alert and oriented x3. Deep tendon reflexes symmetrical and normal.          Skin: Intact without suspicious lesions or rashes. Lymph: No cervical, axillary lymphadenopathy present. Psych: Mood and affect are normal. Normally interactive                                                                                        Assessment & Plan:  #1 comprehensive physical exam; no acute findings #2 see Problem List  with Assessments & Recommendations Plan: see Orders

## 2011-12-12 NOTE — Telephone Encounter (Signed)
Please do not use Q-tips as we discussed. Should wax build up occur, please put 2-3 drops of mineral oil in the ear at night and cover the canal with a  cotton ball. In the morning fill the canal with hydrogen peroxide & leave  for 10-15 minutes. Following this shower and use the thinnest washrag available to wick out the wax.  

## 2011-12-12 NOTE — Patient Instructions (Addendum)
Preventive Health Care: Exercise  30-45  minutes a day, 3-4 days a week. Walking is especially valuable in preventing Osteoporosis. Eat a low-fat diet with lots of fruits and vegetables, up to 7-9 servings per day.  Consume less than 30 grams of sugar per day from foods & drinks with High Fructose Corn Syrup as # 1,2,3 or #4 on label. Eye Doctor - have an eye exam @ least annually Blood Pressure Goal  Ideally is an AVERAGE < 135/85. This AVERAGE should be calculated from @ least 5-7 BP readings taken @ different times of day on different days of week. You should not respond to isolated BP readings , but rather the AVERAGE for that week  Please  schedule fasting Labs : BMET,Lipids, hepatic panel, CBC & dif, TSH. PLEASE BRING THESE INSTRUCTIONS TO FOLLOW UP  LAB APPOINTMENT.This will guarantee correct labs are drawn, eliminating need for repeat blood sampling ( needle sticks ! ). Diagnoses /Codes: V70.0. Please try to go on My Chart  to allow me to release the results directly to you.

## 2011-12-13 ENCOUNTER — Other Ambulatory Visit (INDEPENDENT_AMBULATORY_CARE_PROVIDER_SITE_OTHER): Payer: BC Managed Care – PPO

## 2011-12-13 DIAGNOSIS — R7309 Other abnormal glucose: Secondary | ICD-10-CM

## 2011-12-13 DIAGNOSIS — Z0000 Encounter for general adult medical examination without abnormal findings: Secondary | ICD-10-CM

## 2011-12-13 DIAGNOSIS — E785 Hyperlipidemia, unspecified: Secondary | ICD-10-CM

## 2011-12-13 LAB — TSH: TSH: 1.66 u[IU]/mL (ref 0.35–5.50)

## 2011-12-13 LAB — CBC WITH DIFFERENTIAL/PLATELET
Basophils Relative: 0.7 % (ref 0.0–3.0)
Eosinophils Absolute: 0.2 10*3/uL (ref 0.0–0.7)
Eosinophils Relative: 3.1 % (ref 0.0–5.0)
Lymphocytes Relative: 36.9 % (ref 12.0–46.0)
MCHC: 32.8 g/dL (ref 30.0–36.0)
Neutrophils Relative %: 51.8 % (ref 43.0–77.0)
Platelets: 225 10*3/uL (ref 150.0–400.0)
RBC: 3.9 Mil/uL (ref 3.87–5.11)
WBC: 5.4 10*3/uL (ref 4.5–10.5)

## 2011-12-13 LAB — BASIC METABOLIC PANEL
Calcium: 9 mg/dL (ref 8.4–10.5)
GFR: 91.34 mL/min (ref >60.00)
Sodium: 139 mEq/L (ref 135–145)

## 2011-12-13 LAB — LIPID PANEL
HDL: 88 mg/dL (ref >39.00)
Triglycerides: 52 mg/dL (ref 0.0–149.0)

## 2011-12-13 LAB — LDL CHOLESTEROL, DIRECT: Direct LDL: 123 mg/dL

## 2011-12-13 LAB — HEPATIC FUNCTION PANEL
ALT: 25 U/L (ref 0–35)
AST: 31 U/L (ref 0–37)
Total Protein: 6.5 g/dL (ref 6.0–8.3)

## 2011-12-13 NOTE — Telephone Encounter (Signed)
Spoke to pt to advise results/instructions. Pt understood.  

## 2011-12-18 ENCOUNTER — Ambulatory Visit: Payer: BC Managed Care – PPO | Admitting: Cardiology

## 2012-01-14 ENCOUNTER — Ambulatory Visit (INDEPENDENT_AMBULATORY_CARE_PROVIDER_SITE_OTHER): Payer: BC Managed Care – PPO | Admitting: Cardiology

## 2012-01-14 ENCOUNTER — Encounter: Payer: Self-pay | Admitting: Cardiology

## 2012-01-14 VITALS — BP 119/78 | HR 57 | Ht 63.0 in | Wt 143.8 lb

## 2012-01-14 DIAGNOSIS — I421 Obstructive hypertrophic cardiomyopathy: Secondary | ICD-10-CM

## 2012-01-14 NOTE — Patient Instructions (Addendum)
The current medical regimen is effective;  continue present plan and medications.  Your physician has requested that you have an echocardiogram in 1 year. Echocardiography is a painless test that uses sound waves to create images of your heart. It provides your doctor with information about the size and shape of your heart and how well your heart's chambers and valves are working. This procedure takes approximately one hour. There are no restrictions for this procedure.  Follow up in 1 year with Dr Hochrein.  You will receive a letter in the mail 2 months before you are due.  Please call us when you receive this letter to schedule your follow up appointment.  

## 2012-01-14 NOTE — Progress Notes (Signed)
HPI The patient presents for evaluation of septal hypertrophy.  Since I last saw her she has done well. She denies any ongoing symptoms such as palpitations, presyncope or syncope. She has had no chest pressure, neck or arm discomfort. She denies any shortness of breath, PND or orthopnea. She exercises routinely without any limitations.  Allergies  Allergen Reactions  . Hydrocodone     nausea  . Vicodin (Hydrocodone-Acetaminophen)     nausea    Current Outpatient Prescriptions  Medication Sig Dispense Refill  . amLODipine (NORVASC) 10 MG tablet Take 1 tablet (10 mg total) by mouth daily.  90 tablet  3  . benazepril (LOTENSIN) 40 MG tablet Take 1 tablet (40 mg total) by mouth daily.  90 tablet  3  . Calcium-Magnesium-Vitamin D (CALCIUM MAGNESIUM PO) Take 500 mg by mouth daily.       . Cholecalciferol (VITAMIN D) 1000 UNITS capsule Take 5,000 Units by mouth daily.       . pravastatin (PRAVACHOL) 20 MG tablet Take 1 tablet (20 mg total) by mouth daily.  30 tablet  6  . DISCONTD: pravastatin (PRAVACHOL) 10 MG tablet Take 10 mg by mouth. Sometimes patient takes 10 mg , sometimes 20 mg        Past Medical History  Diagnosis Date  . Hypertension   . ASHD (arteriosclerotic heart disease)     Mild septal hypertrophy, 2D ECHO 11/2006, Dr. Antoine Poche  . Hyperlipidemia   . Breast cancer     PMH of, Dr. Francina Ames, Dr. Donnie Coffin  . Facet syndrome, lumbar     Dr. Charlett Blake  . Skin cancer, basal cell     Dr. Amy [place]    Past Surgical History  Procedure Date  . Cholecystectomy   . Mastectomy   . Rotator cuff repair   . Colonoscopy      X 3 negative (last 2008, due 2013), Dr. Juanda Chance  . Transesophageal echocardiogram 2012    Overall left ventricular systolic function was normal. Left ventricular ejection fraction was estimated, range being 55% to 60%. There were no left ventricular regional wall motional abnormalities. Left ventricular wall thickness was mildly increased. There was mild focal  basal septal hypertrophy.    ROS:  As stated in the HPI and negative for all other systems.  PHYSICAL EXAM BP 119/78  Pulse 57  Ht 5\' 3"  (1.6 m)  Wt 143 lb 12.8 oz (65.227 kg)  BMI 25.47 kg/m2 GENERAL:  Well appearing HEENT:  Pupils equal round and reactive, fundi not visualized, oral mucosa unremarkable NECK:  No jugular venous distention, waveform within normal limits, carotid upstroke brisk and symmetric, no bruits, no thyromegaly LYMPHATICS:  No cervical, inguinal adenopathy LUNGS:  Clear to auscultation bilaterally BACK:  No CVA tenderness CHEST:  Unremarkable HEART:  PMI not displaced or sustained,S1 and S2 within normal limits, no S3, no S4, no clicks, no rubs, apical systolic murmur early peaking and radiating out the outflow tract.  Slight increase with Valsva. ABD:  Flat, positive bowel sounds normal in frequency in pitch, no bruits, no rebound, no guarding, no midline pulsatile mass, no hepatomegaly, no splenomegaly EXT:  2 plus pulses throughout, no edema, no cyanosis no clubbing SKIN:  No rashes no nodules NEURO:  Cranial nerves II through XII grossly intact, motor grossly intact throughout PSYCH:  Cognitively intact, oriented to person place and time   ASSESSMENT AND PLAN  PALPITATIONS -  The patient has had no further palpitations such as those that bothered  her in the past. No change in therapy or further evaluation is indicated.   HYPERTROPHIC CARDIOMYOPATHY -  She has no symptoms. Her exam is unchanged. Therefore, no further imaging is indicated. I reviewed the physiology with her and told her to let me know if she has any symptoms such as dyspnea. I  repeat an echo next year.   HYPERLIPIDEMIA - Her LDL was 123 slightly elevated but her HDL was 88. Therefore, I don't think any change in therapy is indicated. She had no calcium on a coronary calcium score last year. She's had a negative stress test.   HYPERTENSION - The blood pressure is at target. No change in  medications is indicated. We will continue with therapeutic lifestyle changes (TLC).

## 2012-05-13 ENCOUNTER — Other Ambulatory Visit: Payer: Self-pay | Admitting: Cardiology

## 2012-06-20 ENCOUNTER — Other Ambulatory Visit: Payer: Self-pay

## 2012-09-11 ENCOUNTER — Other Ambulatory Visit: Payer: Self-pay | Admitting: Cardiology

## 2012-12-16 ENCOUNTER — Other Ambulatory Visit: Payer: Self-pay | Admitting: Cardiology

## 2012-12-16 ENCOUNTER — Ambulatory Visit (INDEPENDENT_AMBULATORY_CARE_PROVIDER_SITE_OTHER): Payer: BC Managed Care – PPO | Admitting: Internal Medicine

## 2012-12-16 ENCOUNTER — Encounter: Payer: Self-pay | Admitting: Internal Medicine

## 2012-12-16 VITALS — BP 126/74 | HR 68 | Temp 98.1°F | Resp 12 | Ht 62.08 in | Wt 144.0 lb

## 2012-12-16 DIAGNOSIS — Z0000 Encounter for general adult medical examination without abnormal findings: Secondary | ICD-10-CM

## 2012-12-16 DIAGNOSIS — M949 Disorder of cartilage, unspecified: Secondary | ICD-10-CM

## 2012-12-16 DIAGNOSIS — Z800 Family history of malignant neoplasm of digestive organs: Secondary | ICD-10-CM | POA: Insufficient documentation

## 2012-12-16 DIAGNOSIS — C4491 Basal cell carcinoma of skin, unspecified: Secondary | ICD-10-CM | POA: Insufficient documentation

## 2012-12-16 DIAGNOSIS — M899 Disorder of bone, unspecified: Secondary | ICD-10-CM

## 2012-12-16 DIAGNOSIS — R0989 Other specified symptoms and signs involving the circulatory and respiratory systems: Secondary | ICD-10-CM

## 2012-12-16 DIAGNOSIS — E785 Hyperlipidemia, unspecified: Secondary | ICD-10-CM

## 2012-12-16 DIAGNOSIS — I10 Essential (primary) hypertension: Secondary | ICD-10-CM

## 2012-12-16 MED ORDER — AMLODIPINE BESYLATE 10 MG PO TABS: 10.0000 mg | ORAL_TABLET | Freq: Every day | ORAL | Status: DC

## 2012-12-16 MED ORDER — BENAZEPRIL HCL 40 MG PO TABS: 40.0000 mg | ORAL_TABLET | Freq: Every day | ORAL | Status: DC

## 2012-12-16 NOTE — Patient Instructions (Addendum)

## 2012-12-16 NOTE — Telephone Encounter (Signed)
Fax Received. Refill Completed. Traci [name] (R.M.A)   

## 2012-12-16 NOTE — Progress Notes (Signed)
  Subjective:    Patient ID: Traci [name], female    DOB: [birth year], 62 y.o.   MRN: [id number]  HPI  Traci [name] is here for a physical;acute issues denied.     Review of Systems She is on a heart healthy diet; she exercises as walking or CVE > 30  minutes > 5 times per week without symptoms. Specifically she denies chest pain, palpitations, dyspnea, or claudication.  Family history is positive for negative coronary disease in he mother. Advanced cholesterol testing reveals her LDL goal is less than 135. There is medication compliance with the statin. Significant abdominal symptoms, memory deficit, or myalgias denied.  Her blood pressure at home averages less than 120 /80.     Objective:   Physical Exam  Gen.: Healthy and well-nourished in appearance. Alert, appropriate and cooperative throughout exam.Appears younger than stated age  Head: Normocephalic without obvious abnormalities. Eyes: No corneal or conjunctival inflammation noted. Pupils equal round reactive to light and accommodation. Fundal exam is benign without hemorrhages, exudate, papilledema. Extraocular motion intact. Vision grossly normal with lenses Ears: External  ear exam reveals no significant lesions or deformities. Canals clear .TMs normal. Hearing is grossly normal bilaterally. Nose: External nasal exam reveals no deformity or inflammation. Nasal mucosa are pink and moist. No lesions or exudates noted.  Mouth: Oral mucosa and oropharynx reveal no lesions or exudates. Teeth in good repair. Neck: No deformities, masses, or tenderness noted. Range of motion & Thyroid normal. Lungs: Normal respiratory effort; chest expands symmetrically. Lungs are clear to auscultation without rales, wheezes, or increased work of breathing. Heart: Normal rate and rhythm. Normal S1 and S2. No gallop, click, or rub. Grade 1/6 systolic murmur. Abdomen: Bowel sounds normal; abdomen soft and nontender. No masses, organomegaly or hernias  noted. Genitalia: As per Gyn                                  Musculoskeletal/extremities: No deformity or scoliosis noted of  the thoracic or lumbar spine.   No clubbing, cyanosis, edema, or significant extremity  deformity noted. Range of motion normal .Tone & strength  Normal. Joints normal . Nail health good. Able to lie down & sit up w/o help. Negative SLR bilaterally Vascular: Carotid, radial artery, dorsalis pedis and  posterior tibial pulses are full and equal. No bruits present.Aorta palpable ; ? 3.5-4 cm diameter. Neurologic: Alert and oriented x3. Deep tendon reflexes symmetrical and normal.   Skin: Intact without suspicious lesions or rashes. Solar changes. Lymph: No cervical, axillary lymphadenopathy present. Psych: Mood and affect are normal. Normally interactive                                                                                        Assessment & Plan:  #1 comprehensive physical exam  #2 palpable aorta; rule out enlargement/aneurysm  Plan: see Orders  & Recommendations

## 2012-12-21 ENCOUNTER — Encounter (INDEPENDENT_AMBULATORY_CARE_PROVIDER_SITE_OTHER): Payer: BC Managed Care – PPO

## 2012-12-21 ENCOUNTER — Other Ambulatory Visit (INDEPENDENT_AMBULATORY_CARE_PROVIDER_SITE_OTHER): Payer: BC Managed Care – PPO

## 2012-12-21 ENCOUNTER — Other Ambulatory Visit: Payer: Self-pay | Admitting: Internal Medicine

## 2012-12-21 DIAGNOSIS — R0989 Other specified symptoms and signs involving the circulatory and respiratory systems: Secondary | ICD-10-CM

## 2012-12-21 DIAGNOSIS — D649 Anemia, unspecified: Secondary | ICD-10-CM

## 2012-12-21 DIAGNOSIS — I10 Essential (primary) hypertension: Secondary | ICD-10-CM

## 2012-12-21 DIAGNOSIS — Z0000 Encounter for general adult medical examination without abnormal findings: Secondary | ICD-10-CM

## 2012-12-21 LAB — HEPATIC FUNCTION PANEL
Alkaline Phosphatase: 48 U/L (ref 39–117)
Bilirubin, Direct: 0.1 mg/dL (ref 0.0–0.3)

## 2012-12-21 LAB — BASIC METABOLIC PANEL
BUN: 14 mg/dL (ref 6–23)
CO2: 27 mEq/L (ref 19–32)
Calcium: 9.2 mg/dL (ref 8.4–10.5)
Creatinine, Ser: 0.6 mg/dL (ref 0.4–1.2)
Glucose, Bld: 97 mg/dL (ref 70–99)

## 2012-12-21 LAB — LIPID PANEL
HDL: 75.2 mg/dL (ref >39.00)
Triglycerides: 70 mg/dL (ref 0.0–149.0)
VLDL: 14 mg/dL (ref 0.0–40.0)

## 2012-12-21 LAB — TSH: TSH: 2.19 u[IU]/mL (ref 0.35–5.50)

## 2012-12-21 LAB — CBC WITH DIFFERENTIAL/PLATELET
Basophils Absolute: 0 10*3/uL (ref 0.0–0.1)
Eosinophils Absolute: 0.2 10*3/uL (ref 0.0–0.7)
Lymphocytes Relative: 41.5 % (ref 12.0–46.0)
MCHC: 34.2 g/dL (ref 30.0–36.0)
MCV: 92.6 fl (ref 78.0–100.0)
Monocytes Absolute: 0.4 10*3/uL (ref 0.1–1.0)
Neutrophils Relative %: 45.5 % (ref 43.0–77.0)
Platelets: 201 10*3/uL (ref 150.0–400.0)
RDW: 13.8 % (ref 11.5–14.6)

## 2012-12-21 LAB — LDL CHOLESTEROL, DIRECT: Direct LDL: 116.9 mg/dL

## 2012-12-25 LAB — VITAMIN D 1,25 DIHYDROXY: Vitamin D 1, 25 (OH)2 Total: 38 pg/mL (ref 18–72)

## 2012-12-31 ENCOUNTER — Encounter: Payer: Self-pay | Admitting: Nurse Practitioner

## 2013-01-01 ENCOUNTER — Telehealth: Payer: Self-pay | Admitting: Internal Medicine

## 2013-01-01 ENCOUNTER — Telehealth: Payer: Self-pay | Admitting: *Deleted

## 2013-01-01 NOTE — Telephone Encounter (Signed)
LM @ (9:54am) asking the pt to RTC regarding results and note below.//AB/CMA

## 2013-01-01 NOTE — Telephone Encounter (Signed)
Message copied by Verdie Shire on Fri Jan 01, 2013  9:39 AM ------      Message from: Pecola Lawless      Created: Wed Dec 30, 2012  6:11 AM       Please let her know Dr Regino Schultze recommendation about colonoscopy; thanks . Hopp ------

## 2013-01-01 NOTE — Telephone Encounter (Signed)
To Angie to return patient call.      KP

## 2013-01-01 NOTE — Telephone Encounter (Signed)
Patient Information:  Caller Name: Traci [name]  Phone: [phone]  Patient: Traci [name],   Gender: Female  DOB: Oct 28, 1950  Age: 62 Years  PCP: [name]  Office Follow Up:  Does the office need to follow up with this patient?: Yes  Instructions For The Office: Dr. Alwyn Ren requested on 8/27 that [name] be notified of Dr. Regino Schultze recommendation regarding colonoscopy.  Per Epic recommendations by Dr. Juanda Chance are not seen thus triager unable to deliver message/ Please confirm info for message from Dr. Juanda Chance and call [name].   Symptoms  Reason For Call & Symptoms: [name] states she received a voicemail message on 01/01/13 to call the office. Per Epic [name] was called by [name] CMA at 09:55 to per Dr. Frederik Pear request  to deliver Dr. Regino Schultze recommendation regarding colonoscopy. Recommendations per Dr. Juanda Chance regarding colonoscopy are not seen in EPIC per this nurse. [name] was told office will call her back at [phone] with message. PLEASE CALL [name] .  Reviewed Health History In EMR: N/A  Reviewed Medications In EMR: N/A  Reviewed Allergies In EMR: N/A  Reviewed Surgeries / Procedures: N/A  Date of Onset of Symptoms: Unknown  Guideline(s) Used:  No Protocol Available - Information Only  Disposition Per Guideline:   Discuss with PCP and Callback by Nurse Today  Reason For Disposition Reached:   Nursing judgment  Advice Given:  N/A  Patient Will Follow Care Advice:  YES

## 2013-01-05 ENCOUNTER — Other Ambulatory Visit (INDEPENDENT_AMBULATORY_CARE_PROVIDER_SITE_OTHER): Payer: BC Managed Care – PPO

## 2013-01-05 DIAGNOSIS — D649 Anemia, unspecified: Secondary | ICD-10-CM

## 2013-01-05 LAB — CBC WITH DIFFERENTIAL/PLATELET
Basophils Absolute: 0 10*3/uL (ref 0.0–0.1)
HCT: 36.8 % (ref 36.0–46.0)
Lymphocytes Relative: 41.7 % (ref 12.0–46.0)
Lymphs Abs: 2.1 10*3/uL (ref 0.7–4.0)
Monocytes Relative: 10.9 % (ref 3.0–12.0)
Neutrophils Relative %: 44.6 % (ref 43.0–77.0)
Platelets: 224 10*3/uL (ref 150.0–400.0)
RDW: 13.3 % (ref 11.5–14.6)

## 2013-01-05 LAB — VITAMIN B12: Vitamin B-12: 308 pg/mL (ref 211–911)

## 2013-01-05 LAB — IBC PANEL: Saturation Ratios: 21.4 % (ref 20.0–50.0)

## 2013-01-05 NOTE — Telephone Encounter (Signed)
Spoke with pt. Notified her that I am going to call Dr. Juanda Chance with [facility] to discuss the colonoscopy options. Also going to call [facility] to see if she can pick up a hemocult cards.

## 2013-01-05 NOTE — Telephone Encounter (Signed)
Called pt back. LMOVM on 9/2 to return call.

## 2013-01-06 NOTE — Telephone Encounter (Signed)
Pt notified and will be in tomorrow to pick up hemoccult cards.

## 2013-01-06 NOTE — Telephone Encounter (Signed)
Spoke with Dr. Regino Schultze office-pt due for colonoscopy next year. Also spoke with [facility] they did not give her any hemoccult cards because they do not carry them. LMOVM for pt to return call.

## 2013-01-07 NOTE — Telephone Encounter (Signed)
See telephone encounter(01-01-13).//AB/CMA

## 2013-01-08 ENCOUNTER — Other Ambulatory Visit: Payer: Self-pay | Admitting: Internal Medicine

## 2013-01-13 ENCOUNTER — Telehealth: Payer: Self-pay | Admitting: General Practice

## 2013-01-13 NOTE — Telephone Encounter (Signed)
Pt called stating that per her last labs Hopp wrote for her: The normal goal for Vitamin D is 40-60. Vitamin D, along with calcium( 600 mg daily) & weight bearing exercises ( @ least 30 minutes of walking @ least 3X/ week), is essential for bone health. Vitamin D deficiency is the # 1 cause of muscle pain in women.Add 1000 IU vitamin D3 1 pill @ least 3 days / week to present daily dose. Recheck vit D level in 6 months (268.9) Hopp    Pt states that she has no problem taking the D3 1000iu , however she would like to know what her normal daily dose should be?

## 2013-01-13 NOTE — Telephone Encounter (Signed)
Pt notified. taht she should be taking 1,000iu of D3 daily and 3 days a week she should increase to 2,000iu.

## 2013-01-13 NOTE — Telephone Encounter (Signed)
And 1000 international units of vitamin D 3 at least 3 days a week to your present dose. After 6 months of increased dose recheck vitamin D level. Minimal vitamin D goal is greater than 40.

## 2013-01-19 ENCOUNTER — Ambulatory Visit: Payer: Self-pay | Admitting: Nurse Practitioner

## 2013-02-03 ENCOUNTER — Encounter: Payer: Self-pay | Admitting: Internal Medicine

## 2013-02-04 ENCOUNTER — Ambulatory Visit: Payer: Self-pay | Admitting: Nurse Practitioner

## 2013-02-05 ENCOUNTER — Other Ambulatory Visit (INDEPENDENT_AMBULATORY_CARE_PROVIDER_SITE_OTHER): Payer: BC Managed Care – PPO

## 2013-02-05 DIAGNOSIS — D649 Anemia, unspecified: Secondary | ICD-10-CM

## 2013-02-05 LAB — POC HEMOCCULT BLD/STL (HOME/3-CARD/SCREEN)
Card #2 Fecal Occult Blod, POC: NEGATIVE
Card #3 Fecal Occult Blood, POC: NEGATIVE
Fecal Occult Blood, POC: NEGATIVE

## 2013-02-05 NOTE — Telephone Encounter (Signed)
Sheena did we receive a stool card on this patient and if so can we result it. Thanks    KP

## 2013-02-10 ENCOUNTER — Ambulatory Visit (INDEPENDENT_AMBULATORY_CARE_PROVIDER_SITE_OTHER): Payer: BC Managed Care – PPO | Admitting: Nurse Practitioner

## 2013-02-10 ENCOUNTER — Encounter: Payer: Self-pay | Admitting: Nurse Practitioner

## 2013-02-10 VITALS — BP 116/74 | HR 60 | Temp 98.2°F | Resp 16 | Ht 62.5 in | Wt 145.0 lb

## 2013-02-10 DIAGNOSIS — R319 Hematuria, unspecified: Secondary | ICD-10-CM

## 2013-02-10 DIAGNOSIS — Z01419 Encounter for gynecological examination (general) (routine) without abnormal findings: Secondary | ICD-10-CM

## 2013-02-10 DIAGNOSIS — Z0000 Encounter for general adult medical examination without abnormal findings: Secondary | ICD-10-CM

## 2013-02-10 LAB — POCT URINALYSIS DIPSTICK
Nitrite, UA: NEGATIVE
Protein, UA: NEGATIVE
pH, UA: 5

## 2013-02-10 MED ORDER — ESTRADIOL 10 MCG VA TABS: 1.0000 | ORAL_TABLET | VAGINAL | Status: DC

## 2013-02-10 NOTE — Patient Instructions (Signed)

## 2013-02-10 NOTE — Progress Notes (Signed)
Patient ID: Traci [name], female   DOB: May 25, 1950, 62 y.o.   MRN: [id number] 62 y.o. G48P2002 Married Caucasian Fe here for annual exam. She feels well. No vaso symptoms and still some vaginal dryness for which she uses Vagifem prn.   No LMP recorded. Patient is postmenopausal.          Sexually active: yes  The current method of family planning is post menopausal status.    Exercising: yes  Gym/ health club routine includes spin class, body station, bar class 4 x per week. Smoker:  Former   Health Maintenance: Pap:  01/16/12, WNL, neg HR HPV MMG:  12/09/12, BI-Rads 1: negative Colonoscopy:  02/02/07, recheck 7 years  BMD:   11/19/10 T Score:  Spine: -2.2; right femur neck -1.5; total -1.1 TDaP:  01/11/11 Labs: HB: PCP Urine: trace RBC   reports that she quit smoking about 34 years ago. She does not have any smokeless tobacco history on file. She reports that she drinks about 2.4 ounces of alcohol per week. She reports that she does not use illicit drugs.  Past Medical History  Diagnosis Date  . Hypertension   . ASHD (arteriosclerotic heart disease)     Mild septal hypertrophy, 2D ECHO 11/2006, Dr. Antoine Poche  . Hyperlipidemia   . Breast cancer     PMH of, Dr. Francina Ames, Dr. Donnie Coffin  . Facet syndrome, lumbar     Dr. Charlett Blake  . Skin cancer, basal cell     Dr. Amy [place]    Past Surgical History  Procedure Laterality Date  . Cholecystectomy    . Mastectomy    . Rotator cuff repair    . Colonoscopy       X 3 negative (last 2008, due 2013), Dr. Juanda Chance  . Transesophageal echocardiogram  2012    Overall left ventricular systolic function was normal. Left ventricular ejection fraction was estimated, range being 55% to 60%. There were no left ventricular regional wall motional abnormalities. Left ventricular wall thickness was mildly increased. There was mild focal basal septal hypertrophy.    Current Outpatient Prescriptions  Medication Sig Dispense Refill  . amLODipine (NORVASC) 10 MG  tablet Take 1 tablet (10 mg total) by mouth daily.  90 tablet  3  . amLODipine (NORVASC) 10 MG tablet Take 1 tablet (10 mg total) by mouth daily.  90 tablet  3  . benazepril (LOTENSIN) 40 MG tablet Take 1 tablet (40 mg total) by mouth daily.  90 tablet  3  . benazepril (LOTENSIN) 40 MG tablet Take 1 tablet (40 mg total) by mouth daily.  90 tablet  3  . Calcium-Magnesium-Vitamin D (CALCIUM MAGNESIUM PO) Take 500 mg by mouth daily.       . Cholecalciferol (VITAMIN D) 1000 UNITS capsule Take 5,000 Units by mouth daily.       . pravastatin (PRAVACHOL) 20 MG tablet TAKE 1 TABLET (20 MG TOTAL) BY MOUTH DAILY.  30 tablet  0   No current facility-administered medications for this visit.    Family History  Problem Relation Age of Onset  . Hypertension Mother   . Coronary artery disease Mother     no MI  . Lung cancer Mother     smoker  . Diabetes Mother   . Colon cancer Father   . Coronary artery disease Father     no MI  . Prostate cancer Father   . Diabetes Father   . Hypertension Sister   . Cancer Maternal Grandfather  GI  . Stroke Neg Hx     ROS:  Pertinent items are noted in HPI.  Otherwise, a comprehensive ROS was negative.  Exam:   BP 116/74  Pulse 60  Resp 16  Ht 5' 2.5" (1.588 m)  Wt 145 lb (65.772 kg)  BMI 26.08 kg/m2 Height: 5' 2.5" (158.8 cm)  Ht Readings from Last 3 Encounters:  02/10/13 5' 2.5" (1.588 m)  12/16/12 5' 2.08" (1.577 m)  01/14/12 5\' 3"  (1.6 m)    General appearance: alert, cooperative and appears stated age Head: Normocephalic, without obvious abnormality, atraumatic Neck: no adenopathy, supple, symmetrical, trachea midline and thyroid normal to inspection and palpation Lungs: clear to auscultation bilaterally Breasts:right breast with reduction changes and left breast with mastectomy surgical changes with implant. Heart: regular rate and rhythm Abdomen: soft, non-tender; no masses,  no organomegaly Extremities: extremities normal, atraumatic,  no cyanosis or edema Skin: Skin color, texture, turgor normal. 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 No abnormal inguinal nodes palpated Neurologic: Grossly normal   Pelvic: External genitalia:  no lesions              Urethra:  normal appearing urethra with no masses, tenderness or lesions              Bartholin's and Skene's: normal                 Vagina: atrophic appearing vagina with pale color and no discharge, no lesions              Cervix: anteverted              Pap taken: no Bimanual Exam:  Uterus:  normal size, contour, position, consistency, mobility, non-tender              Adnexa: no mass, fullness, tenderness               Rectovaginal: Confirms               Anus:  normal sphincter tone, no lesions  A:  Well Woman with normal exam  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  Counseled on breast self exam, adequate intake of calcium and vitamin D, diet and exercise, Kegel's exercises return annually or prn  An After Visit Summary was printed and given to the patient.  She presents today after having returned from [place] on a Safari-  8 days ago.  We have followed protocol prior to her being seen and treated and she has no symptoms. She is aware of potential symptoms and will monitor herself and husband.

## 2013-02-11 LAB — URINALYSIS, MICROSCOPIC ONLY
Bacteria, UA: NONE SEEN
Casts: NONE SEEN
Squamous Epithelial / HPF: NONE SEEN

## 2013-02-12 LAB — URINE CULTURE
Colony Count: NO GROWTH
Organism ID, Bacteria: NO GROWTH

## 2013-02-14 NOTE — Progress Notes (Signed)
Encounter reviewed by Dr. Brook Silva.  

## 2013-02-17 ENCOUNTER — Telehealth: Payer: Self-pay | Admitting: *Deleted

## 2013-02-17 NOTE — Telephone Encounter (Signed)
Message copied by Luisa Dago on Wed Feb 17, 2013  9:45 AM ------      Message from: Verner Chol      Created: Fri Feb 12, 2013  8:08 AM       Notify urine culture and micro negative. Be sure and have adequate water intake daily ------

## 2013-02-17 NOTE — Telephone Encounter (Signed)
I have attempted to contact this patient by phone with the following results: left message to return my call on answering machine (mobile).  

## 2013-02-22 NOTE — Telephone Encounter (Signed)
Pt.notified

## 2013-03-11 ENCOUNTER — Other Ambulatory Visit: Payer: Self-pay

## 2013-03-16 ENCOUNTER — Telehealth: Payer: Self-pay | Admitting: Nurse Practitioner

## 2013-03-16 ENCOUNTER — Ambulatory Visit (INDEPENDENT_AMBULATORY_CARE_PROVIDER_SITE_OTHER): Payer: BC Managed Care – PPO | Admitting: Obstetrics & Gynecology

## 2013-03-16 ENCOUNTER — Encounter: Payer: Self-pay | Admitting: Obstetrics & Gynecology

## 2013-03-16 VITALS — BP 144/86 | HR 64 | Temp 97.9°F | Resp 20 | Ht 62.5 in | Wt 149.0 lb

## 2013-03-16 DIAGNOSIS — R319 Hematuria, unspecified: Secondary | ICD-10-CM

## 2013-03-16 DIAGNOSIS — N390 Urinary tract infection, site not specified: Secondary | ICD-10-CM

## 2013-03-16 LAB — POCT URINALYSIS DIPSTICK
Blood, UA: 3
Glucose, UA: NEGATIVE
Nitrite, UA: POSITIVE
Protein, UA: NEGATIVE
Urobilinogen, UA: NEGATIVE

## 2013-03-16 MED ORDER — CIPROFLOXACIN HCL 500 MG PO TABS: 500.0000 mg | ORAL_TABLET | Freq: Two times a day (BID) | ORAL | Status: DC

## 2013-03-16 NOTE — Telephone Encounter (Signed)
Pt is driving back from [place], [place] and has a UTI. She wants to come in and leave a sample around. 4:45. Pt is aware she needs an appointment and that we are not a walk in clinic.

## 2013-03-16 NOTE — Progress Notes (Signed)
Subjective:     Patient ID: Traci [name], female   DOB: November 13, 1950, 62 y.o.   MRN: [id number]  HPI 62 yo G2P2 MWF here for 2 day hx of pelvic pressure/bladder pressure, increased frequency, and hematuria.  No fevers.  No back pain.    Newly over the past few months, she feels like she is having more issues with incomplete emptying.  She feels like when she voids, she then has a slow, small amount of urine that is still left.  Patient reports she always has urgency.  Several years ago, she saw Dr. Earlene Plater for urgency.  Her evaluation was negative.  Detrol was recommended but she declined due to side effects.    Discussed micro done 10/8 showing calcium oxalate crystals and relation to nephrolithiasis.  Review of Systems  All other systems reviewed and are negative.       Objective:   Physical Exam  Constitutional: She is oriented to person, place, and time. She appears well-developed and well-nourished.  Abdominal: Soft. Bowel sounds are normal. She exhibits no distension. There is no tenderness.  Genitourinary: Vagina normal.  Musculoskeletal: Normal range of motion.  No flank tenderness  Neurological: She is alert and oriented to person, place, and time.  Skin: Skin is warm and dry.       Assessment:     UTI Hematuri     Plan:     Cipro 500mg  BID x 7 days Urine culture Return for TOC

## 2013-03-16 NOTE — Telephone Encounter (Signed)
Spoke with patient. She is on her way. Dr. Hyacinth Meeker will see her.

## 2013-03-16 NOTE — Patient Instructions (Signed)
Please call tomorrow if your symptoms are not significantly.

## 2013-03-19 LAB — URINE CULTURE: Culture: >=100000

## 2013-03-19 NOTE — Addendum Note (Signed)
Addended by: Jerene Bears on: 03/19/2013 12:47 PM   Modules accepted: Orders

## 2013-03-29 ENCOUNTER — Encounter: Payer: Self-pay | Admitting: Internal Medicine

## 2013-04-05 ENCOUNTER — Other Ambulatory Visit: Payer: BC Managed Care – PPO

## 2013-04-05 ENCOUNTER — Ambulatory Visit (INDEPENDENT_AMBULATORY_CARE_PROVIDER_SITE_OTHER): Payer: BC Managed Care – PPO

## 2013-04-05 VITALS — BP 120/70 | HR 64 | Ht 62.5 in | Wt 147.5 lb

## 2013-04-05 DIAGNOSIS — N390 Urinary tract infection, site not specified: Secondary | ICD-10-CM

## 2013-04-07 LAB — URINE CULTURE: Colony Count: 70000

## 2013-04-08 ENCOUNTER — Telehealth: Payer: Self-pay | Admitting: Emergency Medicine

## 2013-04-08 NOTE — Telephone Encounter (Signed)
Message left to return call to Traci [name] at [phone].    

## 2013-04-08 NOTE — Telephone Encounter (Signed)
Message copied by Joeseph Amor on Thu Apr 08, 2013  4:48 PM ------      Message from: Jerene Bears      Created: Wed Apr 07, 2013 11:11 AM       Please call pt.  Urine culture is + for group B strep which can be a skin contaminant.  If not symptomatic, no additional antibiotics needed.  If still with symptoms, please let me know and I will order abx. ------

## 2013-04-12 NOTE — Telephone Encounter (Signed)
Patient returned call. She states that she is not having symptoms. She will wait to see if any new symptoms develop and if they do, then she will call back.

## 2013-04-23 ENCOUNTER — Ambulatory Visit (INDEPENDENT_AMBULATORY_CARE_PROVIDER_SITE_OTHER): Payer: BC Managed Care – PPO | Admitting: Nurse Practitioner

## 2013-04-23 ENCOUNTER — Encounter: Payer: Self-pay | Admitting: Nurse Practitioner

## 2013-04-23 ENCOUNTER — Telehealth: Payer: Self-pay | Admitting: Nurse Practitioner

## 2013-04-23 VITALS — BP 142/84 | HR 64 | Temp 97.5°F | Ht 62.5 in | Wt 148.0 lb

## 2013-04-23 DIAGNOSIS — N390 Urinary tract infection, site not specified: Secondary | ICD-10-CM

## 2013-04-23 DIAGNOSIS — R300 Dysuria: Secondary | ICD-10-CM

## 2013-04-23 LAB — POCT URINALYSIS DIPSTICK
Bilirubin, UA: NEGATIVE
Nitrite, UA: POSITIVE
Urobilinogen, UA: NEGATIVE
pH, UA: 6

## 2013-04-23 MED ORDER — CIPROFLOXACIN HCL 500 MG PO TABS: 500.0000 mg | ORAL_TABLET | Freq: Two times a day (BID) | ORAL | Status: DC

## 2013-04-23 NOTE — Telephone Encounter (Signed)
Pt thinks she may have another bladder infection. Would like nurse to give her a call.

## 2013-04-23 NOTE — Telephone Encounter (Signed)
Spoke with pt who noticed this morning the same pressure, frequency, and pain with urination that she had several weeks ago. No blood in urine this time. No fever. Pressure has gotten steadily worse since she woke up. Sched OV with PG today at 3 pm.

## 2013-04-23 NOTE — Telephone Encounter (Signed)
Patient is going to give [place] the name of the Urologist she wishes to see.  After she left and went to the Pharmacy she did call back but our phones were already cut off.  She is now laying down and the name is not with her.  She is instructed that when we call her on Monday with the results of urine - she can give [place] information then and we will place the referral.  She is most appreciative of our help.

## 2013-04-23 NOTE — Patient Instructions (Signed)
Urinary Tract Infection  Urinary tract infections (UTIs) can develop anywhere along your urinary tract. Your urinary tract is your body's drainage system for removing wastes and extra water. Your urinary tract includes two kidneys, two ureters, a bladder, and a urethra. Your kidneys are a pair of bean-shaped organs. Each kidney is about the size of your fist. They are located below your ribs, one on each side of your spine.  CAUSES  Infections are caused by microbes, which are microscopic organisms, including fungi, viruses, and bacteria. These organisms are so small that they can only be seen through a microscope. Bacteria are the microbes that most commonly cause UTIs.  SYMPTOMS   Symptoms of UTIs may vary by age and gender of the patient and by the location of the infection. Symptoms in young women typically include a frequent and intense urge to urinate and a painful, burning feeling in the bladder or urethra during urination. Older women and men are more likely to be tired, shaky, and weak and have muscle aches and abdominal pain. A fever may mean the infection is in your kidneys. Other symptoms of a kidney infection include pain in your back or sides below the ribs, nausea, and vomiting.  DIAGNOSIS  To diagnose a UTI, your caregiver will ask you about your symptoms. Your caregiver also will ask to provide a urine sample. The urine sample will be tested for bacteria and Hallmark blood cells. Robak blood cells are made by your body to help fight infection.  TREATMENT   Typically, UTIs can be treated with medication. Because most UTIs are caused by a bacterial infection, they usually can be treated with the use of antibiotics. The choice of antibiotic and length of treatment depend on your symptoms and the type of bacteria causing your infection.  HOME CARE INSTRUCTIONS   If you were prescribed antibiotics, take them exactly as your caregiver instructs you. Finish the medication even if you feel better after you  have only taken some of the medication.   Drink enough water and fluids to keep your urine clear or pale yellow.   Avoid caffeine, tea, and carbonated beverages. They tend to irritate your bladder.   Empty your bladder often. Avoid holding urine for long periods of time.   Empty your bladder before and after sexual intercourse.   After a bowel movement, women should cleanse from front to back. Use each tissue only once.  SEEK MEDICAL CARE IF:    You have back pain.   You develop a fever.   Your symptoms do not begin to resolve within 3 days.  SEEK IMMEDIATE MEDICAL CARE IF:    You have severe back pain or lower abdominal pain.   You develop chills.   You have nausea or vomiting.   You have continued burning or discomfort with urination.  MAKE SURE YOU:    Understand these instructions.   Will watch your condition.   Will get help right away if you are not doing well or get worse.  Document Released: 01/30/2005 Document Revised: 10/22/2011 Document Reviewed: 05/31/2011  Exit[employer] Patient Information 2014 [employer]

## 2013-04-23 NOTE — Progress Notes (Signed)
Subjective:     Patient ID: Traci [name], female   DOB: 1951-03-01, 62 y.o.   MRN: [id number]  HPI This 62 yo WM Fe presents with UTI symptoms with sudden onset this am.  She has associated urgency, frequency and dysuria.  Some pelvic pressure.  Denies fever and chills.  She has UTI 11/11 and was treated with Cipro.  Urine culture was + for E.Coli.  Her TOC on 12/3 showed no E.Coli but Group B strep.  Since she was asymptomatic she was not treated again.  Her  Past urine micro did have calcium oxalate crystals and there was a discussion about maybe a renal calculi that could be causing this problem.  She is OK to get a urological consult.  She has been seen in the past for her urinary urgency and was given Detrol - she did not start secondary to potential side effects.  She is traveling to the [place] just after Christmas and returning on January 3 rd.  Review of Systems  Constitutional: Negative for fever, chills and fatigue.  Respiratory: Negative.   Cardiovascular: Negative.   Gastrointestinal: Negative.  Negative for nausea and vomiting.  Genitourinary: Positive for dysuria, urgency, frequency and decreased urine volume.  Musculoskeletal: Negative.   Skin: Negative.   Neurological: Negative.   Psychiatric/Behavioral: Negative.        Objective:   Physical Exam  Constitutional: She is oriented to person, place, and time. She appears well-developed and well-nourished. She appears distressed.  Abdominal: Soft. She exhibits no distension. There is no tenderness. There is no rebound and no guarding.  No flank pain  Neurological: She is alert and oriented to person, place, and time.  Psychiatric: She has a normal mood and affect. Her behavior is normal. Judgment and thought content normal.       Assessment:     UTI recurrent  R/O possible renal calculi    Plan:     Will restart Cipro 500 mg BID for a week - she is given extra med's as she is leaving for the [place] before she is due to RTO for [facility] Will hopefully get Uro consult done the first of January and will get TOC at that time. I have called her back for the name  - see phone note.

## 2013-04-24 LAB — URINALYSIS, MICROSCOPIC ONLY
Casts: NONE SEEN
Crystals: NONE SEEN
RBC / HPF: >50 RBC/hpf — AB (ref <3)
WBC, UA: >50 WBC/hpf — AB (ref <3)

## 2013-04-24 NOTE — Progress Notes (Signed)
Reviewed personally.  M. Suzanne Courvoisier Hamblen, MD.  

## 2013-04-26 ENCOUNTER — Other Ambulatory Visit: Payer: Self-pay | Admitting: Nurse Practitioner

## 2013-04-26 ENCOUNTER — Telehealth: Payer: Self-pay | Admitting: Nurse Practitioner

## 2013-04-26 DIAGNOSIS — N390 Urinary tract infection, site not specified: Secondary | ICD-10-CM

## 2013-04-26 LAB — URINE CULTURE: Colony Count: >=100000

## 2013-04-26 NOTE — Telephone Encounter (Signed)
Urologist - Crecencio Mc @ [facility]. Patient was told to call with this Doctor's name for her referral.

## 2013-05-10 ENCOUNTER — Telehealth: Payer: Self-pay | Admitting: Nurse Practitioner

## 2013-05-10 NOTE — Telephone Encounter (Signed)
Patient is returning a call to [name]. °

## 2013-05-12 NOTE — Telephone Encounter (Signed)
Message left for pat to return call

## 2013-05-13 NOTE — Telephone Encounter (Signed)
Pt notified on 05/12/13 in result note.

## 2013-05-20 ENCOUNTER — Other Ambulatory Visit: Payer: Self-pay

## 2013-05-20 MED ORDER — PRAVASTATIN SODIUM 20 MG PO TABS
ORAL_TABLET | ORAL | Status: DC
Start: 2013-05-20 — End: 2013-06-15

## 2013-05-27 ENCOUNTER — Telehealth: Payer: Self-pay | Admitting: Nurse Practitioner

## 2013-05-27 NOTE — Telephone Encounter (Signed)
Traci [name] has entered a referral to [facility], Dr Meriam Sprague. Call to patient and notified that referral was entered and we will call in am to check on appointment and call her back.  Gabriel Cirri, can you please call [facility] [phone] and check on her appointment and notify patient. Thank you.

## 2013-05-27 NOTE — Telephone Encounter (Signed)
Pt says she thought she was supposed to get a referral to a specialist for recurrent bladder infections.

## 2013-05-28 NOTE — Telephone Encounter (Signed)
Patient is scheduled for Dr. Alinda Money 01.30.2015 @ 930am. Telephone patient/ voicemail confirmed patient name/ left appointment information as well as [facility] and our telephone # for questions//ssf

## 2013-06-15 ENCOUNTER — Other Ambulatory Visit: Payer: Self-pay | Admitting: *Deleted

## 2013-06-15 MED ORDER — PRAVASTATIN SODIUM 20 MG PO TABS: ORAL_TABLET | ORAL | Status: DC

## 2013-07-19 ENCOUNTER — Encounter: Payer: Self-pay | Admitting: Cardiology

## 2013-07-19 ENCOUNTER — Ambulatory Visit (INDEPENDENT_AMBULATORY_CARE_PROVIDER_SITE_OTHER): Payer: BC Managed Care – PPO | Admitting: Cardiology

## 2013-07-19 VITALS — BP 123/77 | HR 56 | Ht 62.5 in | Wt 150.8 lb

## 2013-07-19 DIAGNOSIS — I10 Essential (primary) hypertension: Secondary | ICD-10-CM

## 2013-07-19 DIAGNOSIS — R002 Palpitations: Secondary | ICD-10-CM

## 2013-07-19 MED ORDER — PRAVASTATIN SODIUM 20 MG PO TABS: ORAL_TABLET | ORAL | Status: DC

## 2013-07-19 NOTE — Patient Instructions (Signed)
The current medical regimen is effective;  continue present plan and medications.  Follow up in 18 months with Dr Percival Spanish.  You will receive a letter in the mail 2 months before you are due.  Please call us when you receive this letter to schedule your follow up appointment.

## 2013-07-19 NOTE — Progress Notes (Signed)
HPI The patient presents for evaluation of septal hypertrophy.  Since I last saw her she has done well. She denies any ongoing symptoms such as palpitations, presyncope or syncope. She has had no chest pressure, neck or arm discomfort. She denies any shortness of breath, PND or orthopnea. She exercises routinely without any limitations.  She is not playing tennis any longer but she is going to the gym.   Allergies  Allergen Reactions  . Shrimp [Shellfish Allergy]     2014 Angioedema of the lips  . Hydrocodone     nausea  . Vicodin [Hydrocodone-Acetaminophen]     nausea    Current Outpatient Prescriptions  Medication Sig Dispense Refill  . amLODipine (NORVASC) 10 MG tablet Take 1 tablet (10 mg total) by mouth daily.  90 tablet  3  . benazepril (LOTENSIN) 40 MG tablet Take 1 tablet (40 mg total) by mouth daily.  90 tablet  3  . Calcium-Magnesium-Vitamin D (CALCIUM MAGNESIUM PO) Take 500 mg by mouth daily.       . Cholecalciferol (VITAMIN D) 1000 UNITS capsule Take 5,000 Units by mouth daily.       . ciprofloxacin (CIPRO) 500 MG tablet Take 1 tablet (500 mg total) by mouth 2 (two) times daily. Take one tablet BID x 14D  28 tablet  0  . Estradiol 10 MCG TABS vaginal tablet Place 1 tablet (10 mcg total) vaginally 2 (two) times a week.  8 tablet  11  . pravastatin (PRAVACHOL) 20 MG tablet TAKE 1 TABLET (20 MG TOTAL) BY MOUTH DAILY.  30 tablet  1   No current facility-administered medications for this visit.    Past Medical History  Diagnosis Date  . Hypertension   . ASHD (arteriosclerotic heart disease)     Mild septal hypertrophy, 2D ECHO 11/2006, Dr. Percival Spanish  . Hyperlipidemia   . Breast cancer     PMH of, Dr. Marylene Buerger, Dr. Truddie Coco  . Facet syndrome, lumbar     Dr. Lynann Bologna  . Skin cancer, basal cell     Dr. Amy Martinique    Past Surgical History  Procedure Laterality Date  . Cholecystectomy  about 1993  . Mastectomy Left 11/2004    Dr. Marylene Buerger, also reduction of right  breast  . Rotator cuff repair    . Colonoscopy       X 3 negative (last 2008, due 2013), Dr. Olevia Perches  . Transesophageal echocardiogram  2012    Overall left ventricular systolic function was normal. Left ventricular ejection fraction was estimated, range being 55% to 60%. There were no left ventricular regional wall motional abnormalities. Left ventricular wall thickness was mildly increased. There was mild focal basal septal hypertrophy.    ROS:  As stated in the HPI and negative for all other systems.  PHYSICAL EXAM BP 123/77  Pulse 56  Ht 5' 2.5" (1.588 m)  Wt 150 lb 12.8 oz (68.402 kg)  BMI 27.12 kg/m2  LMP 05/07/1999 GENERAL:  Well appearing NECK:  No jugular venous distention, waveform within normal limits, carotid upstroke brisk and symmetric, no bruits, no thyromegaly LYMPHATICS:  No cervical, inguinal adenopathy LUNGS:  Clear to auscultation bilaterally CHEST:  Unremarkable HEART:  PMI not displaced or sustained,S1 and S2 within normal limits, no S3, no S4, no clicks, no rubs, apical systolic murmur early peaking and radiating out the outflow tract.  Slight increase with Valsva. ABD:  Flat, positive bowel sounds normal in frequency in pitch, no bruits, no rebound, no  guarding, no midline pulsatile mass, no hepatomegaly, no splenomegaly EXT:  2 plus pulses throughout, no edema, no cyanosis no clubbing  EKG:  Sinus rhythm, rate 56, axis within normal limits, intervals within normal limits, no acute ST-T wave changes.  07/19/2013    ASSESSMENT AND PLAN  PALPITATIONS -  The patient has had no further palpitations such as those that bothered her in the past. No change in therapy or further evaluation is indicated.   HYPERTROPHIC CARDIOMYOPATHY -  She has no symptoms. Her exam is unchanged. Therefore, no further imaging is indicated. I reviewed the physiology with her and told her to let me know if she has any symptoms such as dyspnea.    HYPERLIPIDEMIA - We reviewed the LDL  and HDL from last August.  She has an elevated LDL but excellent ratio.  No change in therapy is indicated.   HYPERTENSION - The blood pressure is at target. No change in medications is indicated. I reviewed her BP diary. We will continue with therapeutic lifestyle changes (TLC).

## 2013-11-16 ENCOUNTER — Telehealth: Payer: Self-pay | Admitting: Cardiology

## 2013-11-16 NOTE — Telephone Encounter (Signed)
Need a new prescription for her Pravastatin 20 mg #90. Please call to [facility]. Also please let the pt know when you have called this in.

## 2013-12-02 NOTE — Telephone Encounter (Signed)
Refilled #90 tablets with 3 refills on 07/19/2013

## 2013-12-07 ENCOUNTER — Encounter: Payer: Self-pay | Admitting: Internal Medicine

## 2013-12-21 ENCOUNTER — Encounter: Payer: BC Managed Care – PPO | Admitting: Internal Medicine

## 2013-12-31 ENCOUNTER — Telehealth: Payer: Self-pay | Admitting: Nurse Practitioner

## 2013-12-31 NOTE — Telephone Encounter (Signed)
BMD results 12/27/13:please inform patient of results.   T score: spine -1.9; right hip neck -1.5; left hip neck: -1.4.  Comparison made to 2012 shows an increase of BMD at the spine by 3.2 % this could be from degenerative changes.  The right hip fell to -4% and left hip fell to -5%.  These are significant changes since last exam.  She is still in the Osteopenic range at all sites. Frax score: hip is 1.6% (risk is > 3%) and major fracture risk is 14.7% (risk is > 20%).   Some of this is normal and expectant postmenopausal loss but we do not want this to be a trend of bone loss.  She does not need bone building therapy.  She must continue with calcium, Vit d and must get in more walking along with her spin class and piliates.

## 2014-01-04 ENCOUNTER — Ambulatory Visit (INDEPENDENT_AMBULATORY_CARE_PROVIDER_SITE_OTHER): Payer: BC Managed Care – PPO | Admitting: Internal Medicine

## 2014-01-04 ENCOUNTER — Encounter: Payer: Self-pay | Admitting: Internal Medicine

## 2014-01-04 VITALS — BP 120/78 | HR 60 | Temp 97.9°F | Ht 62.5 in | Wt 150.0 lb

## 2014-01-04 DIAGNOSIS — Z862 Personal history of diseases of the blood and blood-forming organs and certain disorders involving the immune mechanism: Secondary | ICD-10-CM

## 2014-01-04 DIAGNOSIS — R7309 Other abnormal glucose: Secondary | ICD-10-CM

## 2014-01-04 DIAGNOSIS — E785 Hyperlipidemia, unspecified: Secondary | ICD-10-CM

## 2014-01-04 DIAGNOSIS — M899 Disorder of bone, unspecified: Secondary | ICD-10-CM

## 2014-01-04 DIAGNOSIS — M949 Disorder of cartilage, unspecified: Secondary | ICD-10-CM

## 2014-01-04 DIAGNOSIS — I10 Essential (primary) hypertension: Secondary | ICD-10-CM

## 2014-01-04 NOTE — Progress Notes (Signed)
Pre visit review using our clinic review tool, if applicable. No additional management support is needed unless otherwise documented below in the visit note. 

## 2014-01-04 NOTE — Progress Notes (Signed)
   Subjective:    Patient ID: Traci [name], female    DOB: Nov 11, 1950, 63 y.o.   MRN: [id number]  HPI  She is here to assess active health issues & conditions. PMH, FH, & Social history verified & updated   A heart healthy diet is followed; she adds salt. Exercise encompasses 45 minutes almost daily as CVE & walking without symptoms.  Family history is negative for premature coronary disease. Advanced cholesterol testing reveals  LDL goal is less than 135  ; ideally < 105 . There is medication compliance with the statin.  Low dose ASA not taken BP averages 120/80.  Review of Systems  Specifically denied are  chest pain, palpitations, dyspnea, or claudication.  Significant abdominal symptoms, memory deficit, or myalgias not present.       Objective:   Physical Exam    Gen.: Healthy and well-nourished in appearance. Alert, appropriate and cooperative throughout exam. Appears younger than stated age  Head: Normocephalic without obvious abnormalities  Eyes: No corneal or conjunctival inflammation noted. Pupils equal round reactive to light and accommodation. Extraocular motion intact.  Ears: External  ear exam reveals no significant lesions or deformities. Canals clear .TMs normal. Hearing is grossly normal bilaterally. Nose: External nasal exam reveals no deformity or inflammation. Nasal mucosa are pink and moist. No lesions or exudates noted.   Mouth: Oral mucosa and oropharynx reveal no lesions or exudates. Teeth in good repair. Neck: No deformities, masses, or tenderness noted. Range of motion & Thyroid normal. Lungs: Normal respiratory effort; chest expands symmetrically. Lungs are clear to auscultation without rales, wheezes, or increased work of breathing. Heart: Normal rate and rhythm. Normal S1 and S2. No gallop, click, or rub. Grade 1/6 systolic murmur. Abdomen: Bowel sounds normal; abdomen soft and nontender. No masses, organomegaly or hernias noted. Genitalia: as per Gyn                                   Musculoskeletal/extremities: No deformity or scoliosis noted of  the thoracic or lumbar spine.  No clubbing, cyanosis, edema, or significant extremity  deformity noted. Range of motion normal .Tone & strength normal. Hand joints normal. Minor knee crepitus. Fingernail / toenail health good. Able to lie down & sit up w/o help. Negative SLR bilaterally Vascular: Carotid, radial artery, dorsalis pedis and  posterior tibial pulses are full and equal. No bruits present. Neurologic: Alert and oriented x3. Deep tendon reflexes symmetrical and normal.  Gait normal.      Skin: Intact without suspicious lesions or rashes. Lipoma @ ankle anteriorly. Lymph: No cervical, axillary lymphadenopathy present. Psych: Mood and affect are normal. Normally interactive                                                                                   Assessment & Plan:    See Current Assessment & Plan in Problem List under specific Diagnosis

## 2014-01-04 NOTE — Assessment & Plan Note (Signed)
Vitamin D

## 2014-01-04 NOTE — Assessment & Plan Note (Signed)
A1c

## 2014-01-04 NOTE — Assessment & Plan Note (Signed)
Lipoprofile, LFTs, TSH 

## 2014-01-04 NOTE — Patient Instructions (Signed)

## 2014-01-04 NOTE — Assessment & Plan Note (Signed)
Blood pressure goals reviewed. BMET 

## 2014-01-14 NOTE — Telephone Encounter (Signed)
Pt is notified of results.  She voices understanding.  She will discuss more at AEX.  Pt is transferred to scheduler to schedule AEX.

## 2014-01-21 ENCOUNTER — Ambulatory Visit (AMBULATORY_SURGERY_CENTER): Payer: Self-pay | Admitting: *Deleted

## 2014-01-21 VITALS — Ht 63.5 in | Wt 148.8 lb

## 2014-01-21 DIAGNOSIS — Z800 Family history of malignant neoplasm of digestive organs: Secondary | ICD-10-CM

## 2014-01-21 MED ORDER — MOVIPREP 100 G PO SOLR: ORAL | Status: DC

## 2014-01-21 NOTE — Progress Notes (Signed)
No allergies to eggs or soy. No problems with anesthesia.  Pt given Emmi instructions for colonoscopy  No oxygen use  No diet drug use  

## 2014-01-28 ENCOUNTER — Encounter: Payer: Self-pay | Admitting: Internal Medicine

## 2014-02-02 ENCOUNTER — Ambulatory Visit (AMBULATORY_SURGERY_CENTER): Payer: BC Managed Care – PPO | Admitting: Internal Medicine

## 2014-02-02 ENCOUNTER — Encounter: Payer: Self-pay | Admitting: Internal Medicine

## 2014-02-02 VITALS — BP 146/49 | HR 67 | Temp 96.0°F | Resp 17 | Ht 63.5 in | Wt 148.0 lb

## 2014-02-02 DIAGNOSIS — Z800 Family history of malignant neoplasm of digestive organs: Secondary | ICD-10-CM

## 2014-02-02 DIAGNOSIS — Z1211 Encounter for screening for malignant neoplasm of colon: Secondary | ICD-10-CM

## 2014-02-02 MED ORDER — SODIUM CHLORIDE 0.9 % IV SOLN: 500.0000 mL | INTRAVENOUS | Status: DC

## 2014-02-02 NOTE — Progress Notes (Signed)
A/ox3, pleased with MAC, report to RN 

## 2014-02-02 NOTE — Op Note (Signed)
[facility]  [address] [place], [id number]   COLONOSCOPY PROCEDURE REPORT  PATIENT: Traci [name], Traci [name]  MR#: [id number] BIRTHDATE: Sep 06, 1950 , 63  yrs. old GENDER: female ENDOSCOPIST: Lafayette Dragon, MD REFERRED DX:AJOINOM Linna Darner, M.D. PROCEDURE DATE:  02/02/2014 PROCEDURE:   Colonoscopy, screening First Screening Colonoscopy - Avg.  risk and is 50 yrs.  old or older - No.  Prior Negative Screening - Now for repeat screening. Less than 10 yrs Prior Negative Screening - Now for repeat screening.  Above average risk  History of Adenoma - Now for follow-up colonoscopy & has been > or = to 3 yrs.  N/A  Polyps Removed Today? No.  Polyps Removed Today? No.  Recommend repeat exam, <10 yrs? Polyps Removed Today? No.  Recommend repeat exam, <10 yrs? Yes.  Polyps Removed Today? No.  Recommend repeat exam, <10 yrs? Yes.  High risk (family or personal hx). ASA CLASS:   Class I INDICATIONS:history of colon cancer in patient's father in his 39s. Prior colonoscopies in 1988, 1993, 1997, 2003 and 2008.[name] MEDICATIONS: Monitored anesthesia care and Propofol 340 mg IV  DESCRIPTION OF PROCEDURE:   After the risks benefits and alternatives of the procedure were thoroughly explained, informed consent was obtained.  The digital rectal exam revealed no abnormalities of the rectum.   The LB [id number]  endoscope was introduced through the anus and advanced to the cecum, which was identified by both the appendix and ileocecal valve. No adverse events experienced.   The quality of the prep was excellent, using MoviPrep  The instrument was then slowly withdrawn as the colon was fully examined.      COLON FINDINGS: Small Grade I hemorrhoids were found.  Retroflexed views revealed no abnormalities. The time to cecum=7 minutes 50 seconds.  Withdrawal time=6 minutes 17 seconds.  The scope was withdrawn and the procedure completed. COMPLICATIONS: There were no immediate  complications.  ENDOSCOPIC IMPRESSION: Small Grade I hemorrhoids  RECOMMENDATIONS: 1.  High-fiber diet 2.  Recall colonoscopy in 5-7 years  eSigned:  Lafayette Dragon, MD 02/02/2014 12:20 PM   cc:

## 2014-02-02 NOTE — Patient Instructions (Addendum)
YOU HAD AN ENDOSCOPIC PROCEDURE TODAY AT THE [facility]: Refer to the procedure report that was given to you for any specific questions about what was found during the examination.  If the procedure report does not answer your questions, please call your gastroenterologist to clarify.  If you requested that your care partner not be given the details of your procedure findings, then the procedure report has been included in a sealed envelope for you to review at your convenience later.  YOU SHOULD EXPECT: Some feelings of bloating in the abdomen. Passage of more gas than usual.  Walking can help get rid of the air that was put into your GI tract during the procedure and reduce the bloating. If you had a lower endoscopy (such as a colonoscopy or flexible sigmoidoscopy) you may notice spotting of blood in your stool or on the toilet paper. If you underwent a bowel prep for your procedure, then you may not have a normal bowel movement for a few days.  DIET: Your first meal following the procedure should be a light meal and then it is ok to progress to your normal diet.  A half-sandwich or bowl of soup is an example of a good first meal.  Heavy or fried foods are harder to digest and may make you feel nauseous or bloated.  Likewise meals heavy in dairy and vegetables can cause extra gas to form and this can also increase the bloating.  Drink plenty of fluids but you should avoid alcoholic beverages for 24 hours.  ACTIVITY: Your care partner should take you home directly after the procedure.  You should plan to take it easy, moving slowly for the rest of the day.  You can resume normal activity the day after the procedure however you should NOT DRIVE or use heavy machinery for 24 hours (because of the sedation medicines used during the test).    SYMPTOMS TO REPORT IMMEDIATELY: A gastroenterologist can be reached at any hour.  During normal business hours, 8:30 AM to 5:00 PM Monday through Friday,  call [phone].  After hours and on weekends, please call the GI answering service at [phone] who will take a message and have the physician on call contact you.   Following lower endoscopy (colonoscopy or flexible sigmoidoscopy):  Excessive amounts of blood in the stool  Significant tenderness or worsening of abdominal pains  Swelling of the abdomen that is new, acute  Fever of 100F or higher    FOLLOW UP: If any biopsies were taken you will be contacted by phone or by letter within the next 1-3 weeks.  Call your gastroenterologist if you have not heard about the biopsies in 3 weeks.  Our staff will call the home number listed on your records the next business day following your procedure to check on you and address any questions or concerns that you may have at that time regarding the information given to you following your procedure. This is a courtesy call and so if there is no answer at the home number and we have not heard from you through the emergency physician on call, we will assume that you have returned to your regular daily activities without incident.  SIGNATURES/CONFIDENTIALITY: You and/or your care partner have signed paperwork which will be entered into your electronic medical record.  These signatures attest to the fact that that the information above on your After Visit Summary has been reviewed and is understood.  Full responsibility of the confidentiality   of this discharge information lies with you and/or your care-partner.  Hemorrhoid and high fiber diet information given.  Repeat in 5 -7 years.

## 2014-02-03 ENCOUNTER — Telehealth: Payer: Self-pay | Admitting: *Deleted

## 2014-02-03 NOTE — Telephone Encounter (Signed)
Number identifier, left message, follow-up  

## 2014-02-04 ENCOUNTER — Encounter: Payer: BC Managed Care – PPO | Admitting: Internal Medicine

## 2014-02-22 ENCOUNTER — Encounter: Payer: Self-pay | Admitting: Nurse Practitioner

## 2014-02-22 ENCOUNTER — Ambulatory Visit: Payer: BC Managed Care – PPO | Admitting: Nurse Practitioner

## 2014-03-07 ENCOUNTER — Encounter: Payer: Self-pay | Admitting: Internal Medicine

## 2014-03-14 ENCOUNTER — Other Ambulatory Visit: Payer: Self-pay | Admitting: Internal Medicine

## 2014-03-14 ENCOUNTER — Telehealth: Payer: Self-pay

## 2014-03-14 ENCOUNTER — Other Ambulatory Visit (INDEPENDENT_AMBULATORY_CARE_PROVIDER_SITE_OTHER): Payer: BC Managed Care – PPO

## 2014-03-14 DIAGNOSIS — D6489 Other specified anemias: Secondary | ICD-10-CM

## 2014-03-14 DIAGNOSIS — R7301 Impaired fasting glucose: Secondary | ICD-10-CM

## 2014-03-14 DIAGNOSIS — E785 Hyperlipidemia, unspecified: Secondary | ICD-10-CM

## 2014-03-14 DIAGNOSIS — R739 Hyperglycemia, unspecified: Secondary | ICD-10-CM

## 2014-03-14 LAB — HEPATIC FUNCTION PANEL
ALBUMIN: 3.6 g/dL (ref 3.5–5.2)
ALK PHOS: 46 U/L (ref 39–117)
ALT: 19 U/L (ref 0–35)
AST: 24 U/L (ref 0–37)
Bilirubin, Direct: 0.1 mg/dL (ref 0.0–0.3)
TOTAL PROTEIN: 6.4 g/dL (ref 6.0–8.3)
Total Bilirubin: 0.7 mg/dL (ref 0.2–1.2)

## 2014-03-14 LAB — URINALYSIS
Bilirubin Urine: NEGATIVE
HGB URINE DIPSTICK: NEGATIVE
Ketones, ur: NEGATIVE
Leukocytes, UA: NEGATIVE
NITRITE: NEGATIVE
Specific Gravity, Urine: <=1.005 — AB (ref 1.000–1.030)
Total Protein, Urine: NEGATIVE
URINE GLUCOSE: NEGATIVE
Urobilinogen, UA: 0.2 (ref 0.0–1.0)
pH: 7 (ref 5.0–8.0)

## 2014-03-14 LAB — BASIC METABOLIC PANEL
BUN: 13 mg/dL (ref 6–23)
CALCIUM: 9.4 mg/dL (ref 8.4–10.5)
CO2: 25 meq/L (ref 19–32)
Chloride: 102 mEq/L (ref 96–112)
Creatinine, Ser: 0.7 mg/dL (ref 0.4–1.2)
GFR: 83.66 mL/min (ref >60.00)
Glucose, Bld: 61 mg/dL — ABNORMAL LOW (ref 70–99)
Potassium: 3.7 mEq/L (ref 3.5–5.1)
Sodium: 138 mEq/L (ref 135–145)

## 2014-03-14 LAB — CBC WITH DIFFERENTIAL/PLATELET
BASOS PCT: 0.4 % (ref 0.0–3.0)
Basophils Absolute: 0 10*3/uL (ref 0.0–0.1)
Eosinophils Absolute: 0.1 10*3/uL (ref 0.0–0.7)
Eosinophils Relative: 2.1 % (ref 0.0–5.0)
HCT: 37.2 % (ref 36.0–46.0)
HEMOGLOBIN: 12.1 g/dL (ref 12.0–15.0)
Lymphocytes Relative: 35.9 % (ref 12.0–46.0)
Lymphs Abs: 1.6 10*3/uL (ref 0.7–4.0)
MCHC: 32.4 g/dL (ref 30.0–36.0)
MCV: 93.2 fl (ref 78.0–100.0)
MONOS PCT: 5.2 % (ref 3.0–12.0)
Monocytes Absolute: 0.2 10*3/uL (ref 0.1–1.0)
NEUTROS ABS: 2.5 10*3/uL (ref 1.4–7.7)
Neutrophils Relative %: 56.4 % (ref 43.0–77.0)
Platelets: 247 10*3/uL (ref 150.0–400.0)
RBC: 3.99 Mil/uL (ref 3.87–5.11)
RDW: 14 % (ref 11.5–15.5)
WBC: 4.5 10*3/uL (ref 4.0–10.5)

## 2014-03-14 LAB — HEMOGLOBIN A1C: Hgb A1c MFr Bld: 5.7 % (ref 4.6–6.5)

## 2014-03-14 LAB — LIPID PANEL
Cholesterol: 195 mg/dL (ref 0–200)
HDL: 69.9 mg/dL (ref >39.00)
LDL Cholesterol: 116 mg/dL — ABNORMAL HIGH (ref 0–99)
NONHDL: 125.1
Total CHOL/HDL Ratio: 3
Triglycerides: 46 mg/dL (ref 0.0–149.0)
VLDL: 9.2 mg/dL (ref 0.0–40.0)

## 2014-03-14 LAB — TSH: TSH: 1.71 u[IU]/mL (ref 0.35–4.50)

## 2014-03-14 NOTE — Telephone Encounter (Signed)
Received a call from [employer] at [facility] stating patient came in to have physical labs drawn. She already drew her blood. What orders do you want placed? Please advise, thanks

## 2014-03-16 LAB — NMR LIPOPROFILE WITH LIPIDS
Cholesterol, Total: 191 mg/dL (ref 100–199)
HDL Particle Number: 39.8 umol/L (ref >=30.5)
HDL Size: 9.9 nm (ref >=9.2)
HDL-C: 81 mg/dL (ref >39)
LARGE HDL: 14.8 umol/L (ref >=4.8)
LDL (calc): 98 mg/dL (ref 0–99)
LDL PARTICLE NUMBER: 967 nmol/L (ref <1000)
LDL Size: 21.1 nm (ref >=20.8)
LP-IR Score: <25 (ref <=45)
SMALL LDL PARTICLE NUMBER: 187 nmol/L (ref <=527)
Triglycerides: 62 mg/dL (ref 0–149)
VLDL SIZE: 39.2 nm (ref <=46.6)

## 2014-03-28 ENCOUNTER — Ambulatory Visit (INDEPENDENT_AMBULATORY_CARE_PROVIDER_SITE_OTHER): Payer: No Typology Code available for payment source | Admitting: Nurse Practitioner

## 2014-03-28 ENCOUNTER — Other Ambulatory Visit: Payer: Self-pay

## 2014-03-28 ENCOUNTER — Encounter: Payer: Self-pay | Admitting: Nurse Practitioner

## 2014-03-28 VITALS — BP 132/76 | HR 64 | Ht 62.25 in | Wt 145.0 lb

## 2014-03-28 DIAGNOSIS — Z0000 Encounter for general adult medical examination without abnormal findings: Secondary | ICD-10-CM

## 2014-03-28 DIAGNOSIS — Z01419 Encounter for gynecological examination (general) (routine) without abnormal findings: Secondary | ICD-10-CM

## 2014-03-28 NOTE — Progress Notes (Signed)
Patient ID: Traci [name], female   DOB: [birth year], 63 y.o.   MRN: [id number] 63 y.o. G42P2002 Married Caucasian Fe here for annual exam.  No new health problems.  Feels well.  Patient's last menstrual period was 05/07/1999.          Sexually active: yes  The current method of family planning is post menopausal status.  Exercising: yes Gym/ health club routine includes spin class, body station, bar class 4 x per week. Smoker: Former  Health Maintenance: Pap: 01/16/12, WNL, neg HR HPV MMG: 12/27/13, BI-Rads 1:  Negative  Colonoscopy: 02/02/14, normal, recheck 5-7 years BMD: 12/27/13, T Score:  Spine: -1.9; right femur neck -1.5; left femur neck -1.4; 11/19/10 T Score: Spine: -2.2; right femur neck -1.5; total -1.1 TDaP: 01/11/11 Labs:  PCP in EPIC   reports that she quit smoking about 35 years ago. She has never used smokeless tobacco. She reports that she drinks about 2.4 oz of alcohol per week. She reports that she does not use illicit drugs.  Past Medical History  Diagnosis Date  . Hypertension   . ASHD (arteriosclerotic heart disease)     Mild septal hypertrophy, 2D ECHO 11/2006, Dr. Percival Spanish  . Hyperlipidemia   . Breast cancer     PMH of, Dr. Marylene Buerger, Dr. Truddie Coco  . Facet syndrome, lumbar     Dr. Lynann Bologna  . Skin cancer, basal cell     Dr. Amy Martinique  . Heart murmur   . Osteopenia     Past Surgical History  Procedure Laterality Date  . Cholecystectomy  about 1993  . Mastectomy Left 11/2004    Dr. Marylene Buerger, also reduction of right breast  . Rotator cuff repair Left   . Colonoscopy       X 3 negative (last 2008, due 2015), Dr. Olevia Perches  . Transesophageal echocardiogram  2012    Overall left ventricular systolic function was normal. Left ventricular ejection fraction was estimated, range being 55% to 60%. There were no left ventricular regional wall motional abnormalities. Left ventricular wall thickness was mildly increased. There was mild focal basal  septal hypertrophy.    Current Outpatient Prescriptions  Medication Sig Dispense Refill  . amLODipine (NORVASC) 10 MG tablet Take 1 tablet (10 mg total) by mouth daily. 90 tablet 3  . benazepril (LOTENSIN) 40 MG tablet Take 1 tablet (40 mg total) by mouth daily. 90 tablet 3  . Calcium-Magnesium-Vitamin D (CALCIUM MAGNESIUM PO) Take 500 mg by mouth daily.     . Cholecalciferol (VITAMIN D) 1000 UNITS capsule Take 1,000 Units by mouth daily.     . pravastatin (PRAVACHOL) 20 MG tablet TAKE 1 TABLET (20 MG TOTAL) BY MOUTH DAILY. 90 tablet 3   No current facility-administered medications for this visit.    Family History  Problem Relation Age of Onset  . Hypertension Mother   . Coronary artery disease Mother     no MI  . Lung cancer Mother     smoker  . Colon cancer Father 59  . Coronary artery disease Father     no MI  . Prostate cancer Father   . Diabetes Father   . Hypertension Sister   . Cancer Maternal Grandfather     GI  . Stroke Neg Hx   . Esophageal cancer Neg Hx   . Stomach cancer Neg Hx   . Rectal cancer Neg Hx   . Diabetes Paternal Grandfather     ROS:  Pertinent items are noted  in HPI.  Otherwise, a comprehensive ROS was negative.  Exam:   BP 132/76 mmHg  Pulse 64  Ht 5' 2.25" (1.581 m)  Wt 145 lb (65.772 kg)  BMI 26.31 kg/m2  LMP 05/07/1999 Height: 5' 2.25" (158.1 cm)  Ht Readings from Last 3 Encounters:  03/28/14 5' 2.25" (1.581 m)  02/02/14 5' 3.5" (1.613 m)  01/21/14 5' 3.5" (1.613 m)    General appearance: alert, cooperative and appears stated age Head: Normocephalic, without obvious abnormality, atraumatic Neck: no adenopathy, supple, symmetrical, trachea midline and thyroid normal to inspection and palpation Lungs: clear to auscultation bilaterally Breasts: breast reconstruction and surgical changes bilaterally Heart: regular rate and rhythm Abdomen: soft, non-tender; no masses,  no organomegaly Extremities: extremities normal, atraumatic, no  cyanosis or edema Skin: Skin color, texture, turgor normal. No rashes or lesions Lymph nodes: Cervical, supraclavicular, and axillary nodes normal. No abnormal inguinal nodes palpated Neurologic: Grossly normal   Pelvic: External genitalia:  no lesions              Urethra:  normal appearing urethra with no masses, tenderness or lesions              Bartholin's and Skene's: normal                 Vagina: normal appearing vagina with normal color and discharge, no lesions              Cervix: anteverted              Pap taken: Yes.   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

## 2014-03-28 NOTE — Patient Instructions (Signed)

## 2014-03-29 LAB — IPS PAP TEST WITH HPV

## 2014-03-30 NOTE — Progress Notes (Signed)
Reviewed personally.  M. Suzanne Chanie Soucek, MD.  

## 2014-04-04 ENCOUNTER — Telehealth: Payer: Self-pay

## 2014-04-04 NOTE — Telephone Encounter (Signed)
Labs have been mailed.

## 2014-04-04 NOTE — Telephone Encounter (Signed)
-----   Message from Hendricks Limes, MD sent at 04/02/2014 12:36 PM EST ----- Please mail labs from 11/10; thanks

## 2014-04-12 ENCOUNTER — Other Ambulatory Visit: Payer: Self-pay

## 2014-04-12 MED ORDER — AMLODIPINE BESYLATE 10 MG PO TABS: 10.0000 mg | ORAL_TABLET | Freq: Every day | ORAL | Status: DC

## 2014-04-12 MED ORDER — BENAZEPRIL HCL 40 MG PO TABS: 40.0000 mg | ORAL_TABLET | Freq: Every day | ORAL | Status: DC

## 2014-05-13 ENCOUNTER — Telehealth: Payer: Self-pay | Admitting: Internal Medicine

## 2014-05-13 ENCOUNTER — Other Ambulatory Visit: Payer: Self-pay | Admitting: Internal Medicine

## 2014-05-13 MED ORDER — ACETAZOLAMIDE 125 MG PO TABS: 125.0000 mg | ORAL_TABLET | Freq: Three times a day (TID) | ORAL | Status: DC

## 2014-05-13 MED ORDER — DOXYCYCLINE HYCLATE 100 MG PO TABS: 100.0000 mg | ORAL_TABLET | Freq: Two times a day (BID) | ORAL | Status: DC

## 2014-05-13 NOTE — Telephone Encounter (Signed)
Which location & which pharmacy I'll FAX in

## 2014-05-13 NOTE — Telephone Encounter (Signed)
Pt called in and said that they are going a big trip and needs to see if she can get:  ** something for altitude sickness  **  An Antibiotic in case     Please call her on her cell number

## 2014-05-13 NOTE — Telephone Encounter (Signed)
Patient uses the pharmacy on file [facility]

## 2014-09-20 DIAGNOSIS — C44319 Basal cell carcinoma of skin of other parts of face: Secondary | ICD-10-CM | POA: Diagnosis not present

## 2014-09-20 DIAGNOSIS — Z85828 Personal history of other malignant neoplasm of skin: Secondary | ICD-10-CM | POA: Diagnosis not present

## 2014-09-20 DIAGNOSIS — D485 Neoplasm of uncertain behavior of skin: Secondary | ICD-10-CM | POA: Diagnosis not present

## 2014-09-20 DIAGNOSIS — C4401 Basal cell carcinoma of skin of lip: Secondary | ICD-10-CM | POA: Diagnosis not present

## 2014-10-18 DIAGNOSIS — C4401 Basal cell carcinoma of skin of lip: Secondary | ICD-10-CM | POA: Diagnosis not present

## 2014-10-28 ENCOUNTER — Telehealth: Payer: Self-pay | Admitting: Internal Medicine

## 2014-10-28 NOTE — Telephone Encounter (Signed)
[facility] [address] has been trying to get refills for benazepril (LOTENSIN) 40 MG tablet [[id number] and amLODipine (NORVASC) 10 MG tablet [[id number]. Please call patient at [phone] when the prescription has been sent

## 2014-10-31 ENCOUNTER — Other Ambulatory Visit: Payer: Self-pay | Admitting: Emergency Medicine

## 2014-10-31 MED ORDER — AMLODIPINE BESYLATE 10 MG PO TABS: 10.0000 mg | ORAL_TABLET | Freq: Every day | ORAL | Status: AC

## 2014-10-31 MED ORDER — BENAZEPRIL HCL 40 MG PO TABS: 40.0000 mg | ORAL_TABLET | Freq: Every day | ORAL | Status: AC

## 2014-10-31 NOTE — Telephone Encounter (Signed)
Sent in refills to [facility], Pt will need an office visit for further refills

## 2014-11-01 DIAGNOSIS — I10 Essential (primary) hypertension: Secondary | ICD-10-CM | POA: Diagnosis not present

## 2014-11-01 DIAGNOSIS — Z6826 Body mass index (BMI) 26.0-26.9, adult: Secondary | ICD-10-CM | POA: Diagnosis not present

## 2014-11-01 DIAGNOSIS — E785 Hyperlipidemia, unspecified: Secondary | ICD-10-CM | POA: Diagnosis not present

## 2014-11-01 DIAGNOSIS — Z853 Personal history of malignant neoplasm of breast: Secondary | ICD-10-CM | POA: Diagnosis not present

## 2014-11-01 DIAGNOSIS — I422 Other hypertrophic cardiomyopathy: Secondary | ICD-10-CM | POA: Diagnosis not present

## 2014-11-01 DIAGNOSIS — Z1389 Encounter for screening for other disorder: Secondary | ICD-10-CM | POA: Diagnosis not present

## 2014-12-06 DIAGNOSIS — C4401 Basal cell carcinoma of skin of lip: Secondary | ICD-10-CM | POA: Diagnosis not present

## 2015-01-10 DIAGNOSIS — Z1231 Encounter for screening mammogram for malignant neoplasm of breast: Secondary | ICD-10-CM | POA: Diagnosis not present

## 2015-01-10 DIAGNOSIS — Z853 Personal history of malignant neoplasm of breast: Secondary | ICD-10-CM | POA: Diagnosis not present

## 2015-02-13 DIAGNOSIS — E785 Hyperlipidemia, unspecified: Secondary | ICD-10-CM | POA: Diagnosis not present

## 2015-02-13 DIAGNOSIS — I10 Essential (primary) hypertension: Secondary | ICD-10-CM | POA: Diagnosis not present

## 2015-02-13 DIAGNOSIS — Z0000 Encounter for general adult medical examination without abnormal findings: Secondary | ICD-10-CM | POA: Diagnosis not present

## 2015-02-20 DIAGNOSIS — I10 Essential (primary) hypertension: Secondary | ICD-10-CM | POA: Diagnosis not present

## 2015-02-20 DIAGNOSIS — Z1389 Encounter for screening for other disorder: Secondary | ICD-10-CM | POA: Diagnosis not present

## 2015-02-20 DIAGNOSIS — Z6826 Body mass index (BMI) 26.0-26.9, adult: Secondary | ICD-10-CM | POA: Diagnosis not present

## 2015-02-20 DIAGNOSIS — Z0000 Encounter for general adult medical examination without abnormal findings: Secondary | ICD-10-CM | POA: Diagnosis not present

## 2015-02-20 DIAGNOSIS — E785 Hyperlipidemia, unspecified: Secondary | ICD-10-CM | POA: Diagnosis not present

## 2015-02-20 DIAGNOSIS — Z23 Encounter for immunization: Secondary | ICD-10-CM | POA: Diagnosis not present

## 2015-02-20 DIAGNOSIS — I422 Other hypertrophic cardiomyopathy: Secondary | ICD-10-CM | POA: Diagnosis not present

## 2015-02-20 DIAGNOSIS — Z853 Personal history of malignant neoplasm of breast: Secondary | ICD-10-CM | POA: Diagnosis not present

## 2015-02-20 DIAGNOSIS — M859 Disorder of bone density and structure, unspecified: Secondary | ICD-10-CM | POA: Diagnosis not present

## 2015-02-20 DIAGNOSIS — M8580 Other specified disorders of bone density and structure, unspecified site: Secondary | ICD-10-CM | POA: Diagnosis not present

## 2015-02-21 ENCOUNTER — Ambulatory Visit (INDEPENDENT_AMBULATORY_CARE_PROVIDER_SITE_OTHER): Payer: Self-pay | Admitting: Cardiology

## 2015-02-21 ENCOUNTER — Encounter: Payer: Self-pay | Admitting: Cardiology

## 2015-02-21 VITALS — BP 136/80 | HR 62 | Ht 63.0 in | Wt 147.0 lb

## 2015-02-21 DIAGNOSIS — I10 Essential (primary) hypertension: Secondary | ICD-10-CM

## 2015-02-21 DIAGNOSIS — I421 Obstructive hypertrophic cardiomyopathy: Secondary | ICD-10-CM

## 2015-02-21 DIAGNOSIS — R002 Palpitations: Secondary | ICD-10-CM

## 2015-02-21 NOTE — Progress Notes (Signed)
HPI The patient presents for evaluation of septal hypertrophy.  Since I last saw her she has done well. She denies any ongoing symptoms such as palpitations, presyncope or syncope. She has had no chest pressure, neck or arm discomfort. She denies any shortness of breath, PND or orthopnea. She exercises routinely without any limitations.  She is not bothered by palpitations. She is concerned because her lipid profile is abnormal and she had come off of her pravastatin. I reviewed these records. Her LDL was 169 with an HDL of 83.  Allergies  Allergen Reactions  . Shrimp [Shellfish Allergy]     2014 Angioedema of the lips  . Hydrocodone     nausea  . Vicodin [Hydrocodone-Acetaminophen]     nausea    Current Outpatient Prescriptions  Medication Sig Dispense Refill  . amLODipine (NORVASC) 10 MG tablet Take 1 tablet (10 mg total) by mouth daily. 30 tablet 0  . benazepril (LOTENSIN) 40 MG tablet Take 1 tablet (40 mg total) by mouth daily. 30 tablet 0  . Calcium-Magnesium-Vitamin D (CALCIUM MAGNESIUM PO) Take 500 mg by mouth daily.     . Cholecalciferol (VITAMIN D) 1000 UNITS capsule Take 1,000 Units by mouth daily.     . pravastatin (PRAVACHOL) 20 MG tablet TAKE 1 TABLET (20 MG TOTAL) BY MOUTH DAILY. 90 tablet 3   No current facility-administered medications for this visit.    Past Medical History  Diagnosis Date  . Hypertension   . ASHD (arteriosclerotic heart disease)     Mild septal hypertrophy, 2D ECHO 11/2006, Dr. Percival Spanish  . Hyperlipidemia   . Breast cancer [facility])     PMH of, Dr. Marylene Buerger, Dr. Truddie Coco  . Facet syndrome, lumbar     Dr. Lynann Bologna  . Skin cancer, basal cell     Dr. Amy Martinique  . Heart murmur   . Osteopenia     Past Surgical History  Procedure Laterality Date  . Cholecystectomy  about 1993  . Mastectomy Left 11/2004    Dr. Marylene Buerger, also reduction of right breast  . Rotator cuff repair Left   . Colonoscopy       X 3 negative (last 2008, due 2015), Dr.  Olevia Perches  . Transesophageal echocardiogram  2012    Overall left ventricular systolic function was normal. Left ventricular ejection fraction was estimated, range being 55% to 60%. There were no left ventricular regional wall motional abnormalities. Left ventricular wall thickness was mildly increased. There was mild focal basal septal hypertrophy.    ROS:  As stated in the HPI and negative for all other systems.  PHYSICAL EXAM BP 136/80 mmHg  Pulse 62  Ht 5\' 3"  (1.6 m)  Wt 147 lb (66.679 kg)  BMI 26.05 kg/m2  LMP 05/07/1999 GENERAL:  Well appearing NECK:  No jugular venous distention, waveform within normal limits, carotid upstroke brisk and symmetric, no bruits, no thyromegaly LYMPHATICS:  No cervical, inguinal adenopathy LUNGS:  Clear to auscultation bilaterally CHEST:  Unremarkable HEART:  PMI not displaced or sustained,S1 and S2 within normal limits, no S3, no S4, no clicks, no rubs, apical systolic murmur early peaking and radiating out the outflow tract.  Slight increase with Valsva. ABD:  Flat, positive bowel sounds normal in frequency in pitch, no bruits, no rebound, no guarding, no midline pulsatile mass, no hepatomegaly, no splenomegaly EXT:  2 plus pulses throughout, no edema, no cyanosis no clubbing  EKG:  Sinus rhythm, rate 62, axis within normal limits, intervals within normal  limits, no acute ST-T wave changes.  02/21/2015    ASSESSMENT AND PLAN  PALPITATIONS -  She is no longer bothered by these. No change in therapy is indicated.  HYPERTROPHIC CARDIOMYOPATHY -  He has been several years since she had an echocardiogram and I will follow-up an echo.  HYPERLIPIDEMIA - We had a discussion about this again. Given the increased LDL 69 and a slightly elevated 10 year risk of future cardiovascular events I think it's prudent to restart her statin. She will have a follow-up lipid profile at her primary care office in 12 months.  HYPERTENSION - The blood pressure is at  target. No change in medications is indicated.

## 2015-02-21 NOTE — Patient Instructions (Signed)

## 2015-02-23 DIAGNOSIS — Z85828 Personal history of other malignant neoplasm of skin: Secondary | ICD-10-CM | POA: Diagnosis not present

## 2015-02-23 DIAGNOSIS — L99 Other disorders of skin and subcutaneous tissue in diseases classified elsewhere: Secondary | ICD-10-CM | POA: Diagnosis not present

## 2015-02-23 DIAGNOSIS — L570 Actinic keratosis: Secondary | ICD-10-CM | POA: Diagnosis not present

## 2015-02-23 DIAGNOSIS — D229 Melanocytic nevi, unspecified: Secondary | ICD-10-CM | POA: Diagnosis not present

## 2015-02-27 DIAGNOSIS — Z1212 Encounter for screening for malignant neoplasm of rectum: Secondary | ICD-10-CM | POA: Diagnosis not present

## 2015-03-06 ENCOUNTER — Other Ambulatory Visit: Payer: Self-pay

## 2015-03-06 ENCOUNTER — Ambulatory Visit (HOSPITAL_COMMUNITY): Payer: Medicare Other | Attending: Internal Medicine

## 2015-03-06 DIAGNOSIS — I517 Cardiomegaly: Secondary | ICD-10-CM | POA: Insufficient documentation

## 2015-03-06 DIAGNOSIS — I340 Nonrheumatic mitral (valve) insufficiency: Secondary | ICD-10-CM | POA: Diagnosis not present

## 2015-03-06 DIAGNOSIS — I071 Rheumatic tricuspid insufficiency: Secondary | ICD-10-CM | POA: Insufficient documentation

## 2015-03-06 DIAGNOSIS — I421 Obstructive hypertrophic cardiomyopathy: Secondary | ICD-10-CM | POA: Diagnosis not present

## 2015-03-06 DIAGNOSIS — I10 Essential (primary) hypertension: Secondary | ICD-10-CM | POA: Insufficient documentation

## 2015-03-08 ENCOUNTER — Encounter: Payer: Self-pay | Admitting: Cardiology

## 2015-03-13 ENCOUNTER — Encounter: Payer: Self-pay | Admitting: Cardiology

## 2015-03-14 ENCOUNTER — Telehealth: Payer: Self-pay | Admitting: Cardiology

## 2015-03-14 NOTE — Telephone Encounter (Signed)
Pt is returning the nurse's call about her Echo results. Please f/u with her   Thanks

## 2015-03-14 NOTE — Telephone Encounter (Signed)
Spoke to patient, results of echo communicated. Advised if further questions/concerns to call. Pt voiced thanks and understanding of results & recommendations.

## 2015-04-06 ENCOUNTER — Ambulatory Visit: Payer: No Typology Code available for payment source | Admitting: Nurse Practitioner

## 2015-04-12 ENCOUNTER — Ambulatory Visit: Payer: No Typology Code available for payment source | Admitting: Nurse Practitioner

## 2015-04-14 ENCOUNTER — Ambulatory Visit (INDEPENDENT_AMBULATORY_CARE_PROVIDER_SITE_OTHER): Payer: Medicare Other | Admitting: Nurse Practitioner

## 2015-04-14 ENCOUNTER — Encounter: Payer: Self-pay | Admitting: Nurse Practitioner

## 2015-04-14 VITALS — BP 124/80 | HR 56 | Ht 62.25 in | Wt 146.0 lb

## 2015-04-14 DIAGNOSIS — D0592 Unspecified type of carcinoma in situ of left breast: Secondary | ICD-10-CM | POA: Diagnosis not present

## 2015-04-14 DIAGNOSIS — N952 Postmenopausal atrophic vaginitis: Secondary | ICD-10-CM

## 2015-04-14 DIAGNOSIS — Z01419 Encounter for gynecological examination (general) (routine) without abnormal findings: Secondary | ICD-10-CM

## 2015-04-14 DIAGNOSIS — M8580 Other specified disorders of bone density and structure, unspecified site: Secondary | ICD-10-CM | POA: Diagnosis not present

## 2015-04-14 DIAGNOSIS — Z0000 Encounter for general adult medical examination without abnormal findings: Secondary | ICD-10-CM | POA: Diagnosis not present

## 2015-04-14 DIAGNOSIS — Z124 Encounter for screening for malignant neoplasm of cervix: Secondary | ICD-10-CM

## 2015-04-14 NOTE — Patient Instructions (Addendum)

## 2015-04-14 NOTE — Progress Notes (Signed)
Patient ID: Traci [name], female   DOB: April 29, 1951, 64 y.o.   MRN: [id number]  64 y.o. G27P2002 Married  Caucasian Fe here for annual exam. Doing very well, twin grandchildren are now 70 months old.   Patient's last menstrual period was 05/07/1999 (approximate).          Sexually active: Yes.    The current method of family planning is post menopausal status.    Exercising: Yes.    Gym/ health club routine includes spin classes, weights and walking.  Also plays golf. Smoker:  no  Health Maintenance: Pap: 03/28/14, Negative with neg HR HPV MMG: 01/10/15, 3D, BI-Rads 1: Negative  Colonoscopy: 02/02/14, normal, repeat in 5-7 years BMD: 12/27/13, T Score: Spine: -1.9; right femur neck -1.5; left femur neck -1.4; 11/19/10 T Score: Spine: -2.2; right femur neck -1.5; total -1.1 TDaP: 01/11/11 Labs: PCP in EPIC (scanned)   reports that she quit smoking about 36 years ago. She has never used smokeless tobacco. She reports that she drinks about 2.4 oz of alcohol per week. She reports that she does not use illicit drugs.  Past Medical History  Diagnosis Date  . Hypertension   . ASHD (arteriosclerotic heart disease)     Mild septal hypertrophy, 2D ECHO 11/2006, Dr. Percival Spanish  . Hyperlipidemia   . Breast cancer [facility])     PMH of, Dr. Marylene Buerger, Dr. Truddie Coco  . Facet syndrome, lumbar     Dr. Lynann Bologna  . Skin cancer, basal cell     Dr. Amy Martinique  . Heart murmur   . Osteopenia     Past Surgical History  Procedure Laterality Date  . Cholecystectomy  about 1993  . Mastectomy Left 11/2004    Dr. Marylene Buerger, also reduction of right breast  . Rotator cuff repair Left   . Colonoscopy       X 3 negative (last 2008, due 2015), Dr. Olevia Perches  . Transesophageal echocardiogram  2012    Overall left ventricular systolic function was normal. Left ventricular ejection fraction was estimated, range being 55% to 60%. There were no left ventricular regional wall motional abnormalities. Left  ventricular wall thickness was mildly increased. There was mild focal basal septal hypertrophy.    Current Outpatient Prescriptions  Medication Sig Dispense Refill  . amLODipine (NORVASC) 10 MG tablet Take 1 tablet (10 mg total) by mouth daily. 30 tablet 0  . benazepril (LOTENSIN) 40 MG tablet Take 1 tablet (40 mg total) by mouth daily. 30 tablet 0  . Calcium-Magnesium-Vitamin D (CALCIUM MAGNESIUM PO) Take 500 mg by mouth daily.     . Cholecalciferol (VITAMIN D) 1000 UNITS capsule Take 1,000 Units by mouth daily.     . pravastatin (PRAVACHOL) 20 MG tablet TAKE 1 TABLET (20 MG TOTAL) BY MOUTH DAILY. 90 tablet 3   No current facility-administered medications for this visit.    Family History  Problem Relation Age of Onset  . Hypertension Mother   . Coronary artery disease Mother     no MI  . Lung cancer Mother     smoker  . Colon cancer Father 68  . Coronary artery disease Father     no MI  . Prostate cancer Father   . Diabetes Father   . Hypertension Sister   . Cancer Maternal Grandfather     GI  . Stroke Neg Hx   . Esophageal cancer Neg Hx   . Stomach cancer Neg Hx   . Rectal cancer Neg Hx   .  Diabetes Paternal Grandfather     ROS:  Pertinent items are noted in HPI.  Otherwise, a comprehensive ROS was negative.  Exam:   BP 124/80 mmHg  Pulse 56  Ht 5' 2.25" (1.581 m)  Wt 146 lb (66.225 kg)  BMI 26.49 kg/m2  LMP 05/07/1999 (Approximate) Height: 5' 2.25" (158.1 cm) Ht Readings from Last 3 Encounters:  04/14/15 5' 2.25" (1.581 m)  02/21/15 5\' 3"  (1.6 m)  03/28/14 5' 2.25" (1.581 m)    General appearance: alert, cooperative and appears stated age Head: Normocephalic, without obvious abnormality, atraumatic Neck: no adenopathy, supple, symmetrical, trachea midline and thyroid normal to inspection and palpation Lungs: clear to auscultation bilaterally Breasts: right breast reduction scars and left breast mastectomy with reconstruction surgery Heart: regular rate and  rhythm Abdomen: soft, non-tender; no masses,  no organomegaly Extremities: extremities normal, atraumatic, no cyanosis or edema Skin: Skin color, texture, turgor normal. No rashes or lesions Lymph nodes: Cervical, supraclavicular, and axillary nodes normal. No abnormal inguinal nodes palpated Neurologic: Grossly normal   Pelvic: External genitalia:  no lesions              Urethra:  normal appearing urethra with no masses, tenderness or lesions              Bartholin's and Skene's: normal                 Vagina: normal appearing vagina with normal color and discharge, no lesions              Cervix: anteverted              Pap taken: No. 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

## 2015-04-19 NOTE — Progress Notes (Signed)
Encounter reviewed Traci Covel, MD   

## 2015-12-28 ENCOUNTER — Telehealth: Payer: Self-pay | Admitting: Nurse Practitioner

## 2015-12-28 NOTE — Telephone Encounter (Signed)
Order for BMD to Kem Boroughs, FNP for review and signature for fax to [facility] upon her return to the office.

## 2015-12-28 NOTE — Telephone Encounter (Signed)
Patient needs an order for her BMD 01/17/16 sent to [facility].

## 2016-01-17 DIAGNOSIS — M810 Age-related osteoporosis without current pathological fracture: Secondary | ICD-10-CM | POA: Diagnosis not present

## 2016-01-17 DIAGNOSIS — M8589 Other specified disorders of bone density and structure, multiple sites: Secondary | ICD-10-CM | POA: Diagnosis not present

## 2016-01-17 DIAGNOSIS — Z1231 Encounter for screening mammogram for malignant neoplasm of breast: Secondary | ICD-10-CM | POA: Diagnosis not present

## 2016-01-17 DIAGNOSIS — Z853 Personal history of malignant neoplasm of breast: Secondary | ICD-10-CM | POA: Diagnosis not present

## 2016-01-29 ENCOUNTER — Telehealth: Payer: Self-pay | Admitting: Nurse Practitioner

## 2016-01-29 NOTE — Telephone Encounter (Signed)
Spoke with patient. Patient scheduled for consult with Dr. Quincy Simmonds 02/01/16 at 1:00pm, as requested per Kem Boroughs, NP as seen below. Patient is agreeable to date and time.    Routing to provider for final review. Patient is agreeable to disposition. Will close encounter.     CC: Kem Boroughs, NP

## 2016-01-29 NOTE — Telephone Encounter (Signed)
Please let pt know that BMD done on  01/17/16 shows a T Score of the spine at -3.40; right hip neck -2.0; left hip neck at -1.60.  Compared to previous exam 12/27/13 there is a loss at all sites.  The most concerning is at the spine with a loss of 20%.  She has other risk factors such as post breast cancer in 2006.  She has a low BMI.  I feel that she should come in and discuss with Dr. Quincy Simmonds her options of care to prevent further loss.

## 2016-01-30 ENCOUNTER — Encounter: Payer: Self-pay | Admitting: Nurse Practitioner

## 2016-02-01 ENCOUNTER — Ambulatory Visit: Payer: Self-pay | Admitting: Obstetrics and Gynecology

## 2016-02-02 ENCOUNTER — Ambulatory Visit: Payer: Medicare Other | Admitting: Obstetrics and Gynecology

## 2016-02-02 DIAGNOSIS — H04123 Dry eye syndrome of bilateral lacrimal glands: Secondary | ICD-10-CM | POA: Diagnosis not present

## 2016-02-02 DIAGNOSIS — H2513 Age-related nuclear cataract, bilateral: Secondary | ICD-10-CM | POA: Diagnosis not present

## 2016-02-12 ENCOUNTER — Ambulatory Visit (INDEPENDENT_AMBULATORY_CARE_PROVIDER_SITE_OTHER): Payer: Medicare Other | Admitting: Obstetrics and Gynecology

## 2016-02-12 ENCOUNTER — Encounter: Payer: Self-pay | Admitting: Obstetrics and Gynecology

## 2016-02-12 VITALS — BP 120/76 | HR 72 | Resp 16 | Ht 62.25 in | Wt 136.0 lb

## 2016-02-12 DIAGNOSIS — M810 Age-related osteoporosis without current pathological fracture: Secondary | ICD-10-CM

## 2016-02-12 NOTE — Patient Instructions (Signed)
Osteoporosis Osteoporosis is the thinning and loss of density in the bones. Osteoporosis makes the bones more brittle, fragile, and likely to break (fracture). Over time, osteoporosis can cause the bones to become so weak that they fracture after a simple fall. The bones most likely to fracture are the bones in the hip, wrist, and spine. CAUSES  The exact cause is not known. RISK FACTORS Anyone can develop osteoporosis. You may be at greater risk if you have a family history of the condition or have poor nutrition. You may also have a higher risk if you are:   Female.   67 years old or older.  A smoker.  Not physically active.   Radick or Asian.  Slender. SIGNS AND SYMPTOMS  A fracture might be the first sign of the disease, especially if it results from a fall or injury that would not usually cause a bone to break. Other signs and symptoms include:   Low back and neck pain.  Stooped posture.  Height loss. DIAGNOSIS  To make a diagnosis, your health care provider may:  Take a medical history.  Perform a physical exam.  Order tests, such as:  A bone mineral density test.  A dual-energy X-ray absorptiometry test. TREATMENT  The goal of osteoporosis treatment is to strengthen your bones to reduce your risk of a fracture. Treatment may involve:  Making lifestyle changes, such as:  Eating a diet rich in calcium.  Doing weight-bearing and muscle-strengthening exercises.  Stopping tobacco use.  Limiting alcohol intake.  Taking medicine to slow the process of bone loss or to increase bone density.  Monitoring your levels of calcium and vitamin D. HOME CARE INSTRUCTIONS  Include calcium and vitamin D in your diet. Calcium is important for bone health, and vitamin D helps the body absorb calcium.  Perform weight-bearing and muscle-strengthening exercises as directed by your health care provider.  Do not use any tobacco products, including cigarettes, chewing  tobacco, and electronic cigarettes. If you need help quitting, ask your health care provider.  Limit your alcohol intake.  Take medicines only as directed by your health care provider.  Keep all follow-up visits as directed by your health care provider. This is important.  Take precautions at home to lower your risk of falling, such as:  Keeping rooms well lit and clutter free.  Installing safety rails on stairs.  Using rubber mats in the bathroom and other areas that are often wet or slippery. SEEK IMMEDIATE MEDICAL CARE IF:  You fall or injure yourself.    This information is not intended to replace advice given to you by your health care provider. Make sure you discuss any questions you have with your health care provider.   Document Released: 01/30/2005 Document Revised: 05/13/2014 Document Reviewed: 09/30/2013 Elsevier Interactive Patient Education 2016 Elsevier Inc.  Alendronate weekly tablets What is this medicine? ALENDRONATE (a LEN droe nate) slows calcium loss from bones. It helps to make healthy bone and to slow bone loss in people with osteoporosis. It may be used to treat Paget's disease. This medicine may be used for other purposes; ask your health care provider or pharmacist if you have questions. What should I tell my health care provider before I take this medicine? They need to know if you have any of these conditions: -esophagus, stomach, or intestine problems, like acid-reflux or GERD -dental disease -kidney disease -low blood calcium -low vitamin D -problems swallowing -problems sitting or standing for 30 minutes -an unusual or allergic  reaction to alendronate, other medicines, foods, dyes, or preservatives -pregnant or trying to get pregnant -breast-feeding How should I use this medicine? You must take this medicine exactly as directed or you will lower the amount of medicine you absorb into your body or you may cause yourself harm. Take your dose by  mouth first thing in the morning, after you are up for the day. Do not eat or drink anything before you take this medicine. Swallow your medicine with a full glass (6 to 8 fluid ounces) of plain water. Do not take this tablet with any other drink. Do not chew or crush the tablet. After taking this medicine, do not eat breakfast, drink, or take any medicines or vitamins for at least 30 minutes. Stand or sit up for at least 30 minutes after you take this medicine; do not lie down. Take this medicine on the same day every week. Do not take your medicine more often than directed. Talk to your pediatrician regarding the use of this medicine in children. Special care may be needed. Overdosage: If you think you have taken too much of this medicine contact a poison control center or emergency room at once. NOTE: This medicine is only for you. Do not share this medicine with others. What if I miss a dose? If you miss a dose, take the dose on the morning after you remember. Then take your next dose on your regular day of the week. Never take 2 tablets on the same day. Do not take double or extra doses. What may interact with this medicine? -aluminum hydroxide -antacids -aspirin -calcium supplements -drugs for inflammation like ibuprofen, naproxen, and others -iron supplements -magnesium supplements -vitamins with minerals This list may not describe all possible interactions. Give your health care provider a list of all the medicines, herbs, non-prescription drugs, or dietary supplements you use. Also tell them if you smoke, drink alcohol, or use illegal drugs. Some items may interact with your medicine. What should I watch for while using this medicine? Visit your doctor or health care professional for regular checks ups. It may be some time before you see benefit from this medicine. Do not stop taking your medication except on your doctor's advice. Your doctor or health care professional may order blood  tests and other tests to see how you are doing. You should make sure you get enough calcium and vitamin D while you are taking this medicine, unless your doctor tells you not to. Discuss the foods you eat and the vitamins you take with your health care professional. Some people who take this medicine have severe bone, joint, and/or muscle pain. This medicine may also increase your risk for a broken thigh bone. Tell your doctor right away if you have pain in your upper leg or groin. Tell your doctor if you have any pain that does not go away or that gets worse. This medicine can make you more sensitive to the sun. If you get a rash while taking this medicine, sunlight may cause the rash to get worse. Keep out of the sun. If you cannot avoid being in the sun, wear protective clothing and use sunscreen. Do not use sun lamps or tanning beds/booths. What side effects may I notice from receiving this medicine? Side effects that you should report to your doctor or health care professional as soon as possible: -allergic reactions like skin rash, itching or hives, swelling of the face, lips, or tongue -black or tarry stools -bone, muscle or  joint pain -changes in vision -chest pain -heartburn or stomach pain -jaw pain, especially after dental work -pain or trouble when swallowing -redness, blistering, peeling or loosening of the skin, including inside the mouth Side effects that usually do not require medical attention (report to your doctor or health care professional if they continue or are bothersome): -changes in taste -diarrhea or constipation -eye pain or itching -headache -nausea or vomiting -stomach gas or fullness This list may not describe all possible side effects. Call your doctor for medical advice about side effects. You may report side effects to FDA at 1-800-FDA-1088. Where should I keep my medicine? Keep out of the reach of children. Store at room temperature of 15 and 30 degrees C  (59 and 86 degrees F). Throw away any unused medicine after the expiration date. NOTE: This sheet is a summary. It may not cover all possible information. If you have questions about this medicine, talk to your doctor, pharmacist, or health care provider.    2016, Elsevier/Gold Standard. (2011-03-07 09:02:42)

## 2016-02-12 NOTE — Progress Notes (Signed)
GYNECOLOGY  VISIT   HPI: 65 y.o.   Married  Caucasian  female   G2P2002 with Patient's last menstrual period was 05/07/1999 (approximate).   here for  BMD consult.  [facility] BMD - 01/17/16: T score of Right hip -2.0.                    Left hip -1.6                   Spine -3.4.  Prior BMD in 2015 showed osteopenia.  Does weight bearing exercise.  Does not take calcium regularly.   Does not know if there is family hx of osteoporosis.  Not a smoker.  2 glasses of wine on Fridays and Saturday's. Not taking steroids.  No colitis or weight loss surgery.  No plans for future dental implants. Had them in the past.  No reflux.  Has cardiomyopathy and hx of breast cancer.   PCP - Dr. Lang Snow.   GYNECOLOGIC HISTORY: Patient's last menstrual period was 05/07/1999 (approximate). Contraception:  Post menopausal Menopausal hormone therapy: none Last mammogram: rt breast 01-17-16 category a density birads 1:neg, left breast mastectomy Last pap smear:   03/28/14 neg HPV HR neg        OB History    Gravida Para Term Preterm AB Living   2 2 2     2    SAB TAB Ectopic Multiple Live Births                     Patient Active Problem List   Diagnosis Date Noted  . Basal cell carcinoma of skin 12/16/2012  . Family history of colon cancer 12/16/2012  . ANEMIA, MILD 04/16/2010  . HYPERTROPHIC CARDIOMYOPATHY 01/22/2010  . Palpitations 01/22/2010  . MURMUR 01/10/2009  . SPONDYLOSIS, LUMBAR 01/04/2009  . OSTEOPENIA 01/04/2009  . FASTING HYPERGLYCEMIA 01/04/2009  . HYPERLIPIDEMIA 02/04/2008  . BREAST CANCER, HX OF 02/04/2008  . Essential hypertension 09/18/2006    Past Medical History:  Diagnosis Date  . ASHD (arteriosclerotic heart disease)    Mild septal hypertrophy, 2D ECHO 11/2006, Dr. Percival Spanish  . Breast cancer [facility])    PMH of, Dr. Marylene Buerger, Dr. Truddie Coco  . Facet syndrome, lumbar    Dr. Lynann Bologna  . Heart murmur   . Hyperlipidemia   . Hypertension   . Osteopenia   . Skin  cancer, basal cell    Dr. Amy Martinique    Past Surgical History:  Procedure Laterality Date  . CHOLECYSTECTOMY  about 1993  . COLONOSCOPY      X 3 negative (last 2008, due 2015), Dr. Olevia Perches  . MASTECTOMY Left 11/2004   Dr. Marylene Buerger, also reduction of right breast  . ROTATOR CUFF REPAIR Left   . TRANSESOPHAGEAL ECHOCARDIOGRAM  2012   Overall left ventricular systolic function was normal. Left ventricular ejection fraction was estimated, range being 55% to 60%. There were no left ventricular regional wall motional abnormalities. Left ventricular wall thickness was mildly increased. There was mild focal basal septal hypertrophy.    Current Outpatient Prescriptions  Medication Sig Dispense Refill  . amLODipine (NORVASC) 10 MG tablet Take 1 tablet (10 mg total) by mouth daily. 30 tablet 0  . benazepril (LOTENSIN) 40 MG tablet Take 1 tablet (40 mg total) by mouth daily. 30 tablet 0  . Calcium-Magnesium-Vitamin D (CALCIUM MAGNESIUM PO) Take 500 mg by mouth daily.     . Cholecalciferol (VITAMIN D) 1000 UNITS capsule Take 1,000  Units by mouth daily.     . pravastatin (PRAVACHOL) 20 MG tablet TAKE 1 TABLET (20 MG TOTAL) BY MOUTH DAILY. 90 tablet 3   No current facility-administered medications for this visit.      ALLERGIES: Shrimp [shellfish allergy]; Hydrocodone; and Vicodin [hydrocodone-acetaminophen]  Family History  Problem Relation Age of Onset  . Hypertension Mother   . Coronary artery disease Mother     no MI  . Lung cancer Mother     smoker  . Colon cancer Father 51  . Coronary artery disease Father     no MI  . Prostate cancer Father   . Diabetes Father   . Hypertension Sister   . Cancer Maternal Grandfather     GI  . Stroke Neg Hx   . Esophageal cancer Neg Hx   . Stomach cancer Neg Hx   . Rectal cancer Neg Hx   . Diabetes Paternal Grandfather     Social History   Social History  . Marital status: Married    Spouse name: N/A  . Number of children: N/A  . Years  of education: N/A   Occupational History  . Retired Unemployed   Social History Main Topics  . Smoking status: Former Smoker    Packs/day: 1.00    Years: 10.00    Quit date: 05/06/1978  . Smokeless tobacco: Never Used     Comment: smoked [place], up to 1 ppd  . Alcohol use 2.4 oz/week    4 Glasses of wine per week  . Drug use: No  . Sexual activity: Yes    Partners: Male   Other Topics Concern  . Not on file   Social History Narrative   Married   Regular exercise: Yes, high level CVE, walks 2 mpd 5x/ week & gym   No specific diet    ROS:  Pertinent items are noted in HPI.  PHYSICAL EXAMINATION:    BP 120/76   Pulse 72   Resp 16   Ht 5' 2.25" (1.581 m)   Wt 136 lb (61.7 kg)   LMP 05/07/1999 (Approximate)   BMI 24.68 kg/m     General appearance: alert, cooperative and appears stated age   ASSESSMENT   Osteoporosis.  Hx breast cancer.  Hx cardiomyopathy.  HTN.  PLAN  Discussion of osteoporosis in general.  Risk factors, treatment options, fall reduction risk, and morbility and mortality related to fractures discussed.  Options for tx reviewed: bisphoshonates, Evista, Prolia, Forteo. Will check CMP and vit D through her PCP tomorrow. May need PTH if Ca elevated. Will likely start Fosamax 70 mg weekly after blood work is completed.  Instructed how to take.  Continue weight bearing exercise.  Importance of Ca/vit D discussed.  Next BMD in one year.  Copy of report for patient to share with her PCP tomorrow.  An After Visit Summary was printed and given to the patient.  __25____ minutes face to face time of which over 50% was spent in counseling.

## 2016-02-13 ENCOUNTER — Encounter: Payer: Self-pay | Admitting: Obstetrics and Gynecology

## 2016-02-13 DIAGNOSIS — M859 Disorder of bone density and structure, unspecified: Secondary | ICD-10-CM | POA: Diagnosis not present

## 2016-02-13 DIAGNOSIS — E784 Other hyperlipidemia: Secondary | ICD-10-CM | POA: Diagnosis not present

## 2016-02-13 DIAGNOSIS — I10 Essential (primary) hypertension: Secondary | ICD-10-CM | POA: Diagnosis not present

## 2016-02-14 ENCOUNTER — Encounter: Payer: Self-pay | Admitting: Cardiology

## 2016-02-22 DIAGNOSIS — Z23 Encounter for immunization: Secondary | ICD-10-CM | POA: Diagnosis not present

## 2016-02-22 DIAGNOSIS — Z0000 Encounter for general adult medical examination without abnormal findings: Secondary | ICD-10-CM | POA: Diagnosis not present

## 2016-02-22 DIAGNOSIS — Z6824 Body mass index (BMI) 24.0-24.9, adult: Secondary | ICD-10-CM | POA: Diagnosis not present

## 2016-02-22 DIAGNOSIS — I422 Other hypertrophic cardiomyopathy: Secondary | ICD-10-CM | POA: Diagnosis not present

## 2016-02-22 DIAGNOSIS — E784 Other hyperlipidemia: Secondary | ICD-10-CM | POA: Diagnosis not present

## 2016-02-22 DIAGNOSIS — M810 Age-related osteoporosis without current pathological fracture: Secondary | ICD-10-CM | POA: Diagnosis not present

## 2016-02-22 DIAGNOSIS — I10 Essential (primary) hypertension: Secondary | ICD-10-CM | POA: Diagnosis not present

## 2016-02-22 DIAGNOSIS — Z1389 Encounter for screening for other disorder: Secondary | ICD-10-CM | POA: Diagnosis not present

## 2016-02-22 DIAGNOSIS — Z853 Personal history of malignant neoplasm of breast: Secondary | ICD-10-CM | POA: Diagnosis not present

## 2016-02-26 DIAGNOSIS — Z1212 Encounter for screening for malignant neoplasm of rectum: Secondary | ICD-10-CM | POA: Diagnosis not present

## 2016-02-27 ENCOUNTER — Ambulatory Visit (INDEPENDENT_AMBULATORY_CARE_PROVIDER_SITE_OTHER): Payer: Medicare Other | Admitting: Cardiology

## 2016-02-27 ENCOUNTER — Encounter: Payer: Self-pay | Admitting: Cardiology

## 2016-02-27 VITALS — BP 142/90 | HR 56 | Ht 63.0 in | Wt 138.0 lb

## 2016-02-27 DIAGNOSIS — I10 Essential (primary) hypertension: Secondary | ICD-10-CM

## 2016-02-27 DIAGNOSIS — I421 Obstructive hypertrophic cardiomyopathy: Secondary | ICD-10-CM | POA: Diagnosis not present

## 2016-02-27 DIAGNOSIS — R002 Palpitations: Secondary | ICD-10-CM | POA: Diagnosis not present

## 2016-02-27 NOTE — Progress Notes (Signed)
HPI The patient presents for evaluation of septal hypertrophy.  Last year an echo demonstrated that there is moderate hypertrophy of the septum but this is not causing any obstruction to flow.  She returns for follow up.  The patient denies any new symptoms such as chest discomfort, neck or arm discomfort. There has been no new shortness of breath, PND or orthopnea. There have been no reported palpitations, presyncope or syncope.   Allergies  Allergen Reactions  . Shrimp [Shellfish Allergy]     2014 Angioedema of the lips, pt had it since then with no reaction  . Hydrocodone     nausea  . Vicodin [Hydrocodone-Acetaminophen]     nausea    Current Outpatient Prescriptions  Medication Sig Dispense Refill  . amLODipine (NORVASC) 10 MG tablet Take 1 tablet (10 mg total) by mouth daily. 30 tablet 0  . benazepril (LOTENSIN) 40 MG tablet Take 1 tablet (40 mg total) by mouth daily. 30 tablet 0  . Calcium-Magnesium-Vitamin D (CALCIUM MAGNESIUM PO) Take 500 mg by mouth daily.     . Cholecalciferol (VITAMIN D) 1000 UNITS capsule Take 1,000 Units by mouth daily.     . pravastatin (PRAVACHOL) 20 MG tablet TAKE 1 TABLET (20 MG TOTAL) BY MOUTH DAILY. 90 tablet 3   No current facility-administered medications for this visit.     Past Medical History:  Diagnosis Date  . ASHD (arteriosclerotic heart disease)    Mild septal hypertrophy, 2D ECHO 11/2006, Dr. Percival Spanish  . Breast cancer [facility])    PMH of, Dr. Marylene Buerger, Dr. Truddie Coco  . Facet syndrome, lumbar    Dr. Lynann Bologna  . Heart murmur   . Hyperlipidemia   . Hypertension   . Osteopenia   . Skin cancer, basal cell    Dr. Amy Martinique    Past Surgical History:  Procedure Laterality Date  . CHOLECYSTECTOMY  about 1993  . COLONOSCOPY      X 3 negative (last 2008, due 2015), Dr. Olevia Perches  . MASTECTOMY Left 11/2004   Dr. Marylene Buerger, also reduction of right breast  . ROTATOR CUFF REPAIR Left   . TRANSESOPHAGEAL ECHOCARDIOGRAM  2012   Overall left  ventricular systolic function was normal. Left ventricular ejection fraction was estimated, range being 55% to 60%. There were no left ventricular regional wall motional abnormalities. Left ventricular wall thickness was mildly increased. There was mild focal basal septal hypertrophy.    ROS:    As stated in the HPI and negative for all other systems.  PHYSICAL EXAM BP (!) 142/90   Pulse (!) 56   Ht 5\' 3"  (1.6 m)   Wt 138 lb (62.6 kg)   LMP 05/07/1999 (Approximate)   BMI 24.45 kg/m  GENERAL:  Well appearing NECK:  No jugular venous distention, waveform within normal limits, carotid upstroke brisk and symmetric, no bruits, no thyromegaly LUNGS:  Clear to auscultation bilaterally HEART:  PMI not displaced or sustained,S1 and S2 within normal limits, no S3, no S4, no clicks, no rubs, apical systolic murmur early peaking and radiating out the outflow tract.  Slight increase with Valsva. ABD:  Flat, positive bowel sounds normal in frequency in pitch, no bruits, no rebound, no guarding, no midline pulsatile mass, no hepatomegaly, no splenomegaly EXT:  2 plus pulses throughout, no edema, no cyanosis no clubbing  EKG:  Sinus rhythm, rate 56, axis within normal limits, intervals within normal limits, no acute ST-T wave changes.  02/27/2016   ASSESSMENT AND PLAN  PALPITATIONS -  She is no longer bothered by these. No change in therapy is indicated.  HYPERTROPHIC CARDIOMYOPATHY -  She had an echo last year with results as above.  The exam is unchanged. She has no symptoms. Further imaging is indicated.  HYPERLIPIDEMIA - Her LDL was 143 with an HDL of 72.  We discussed this again today. Continue the meds as listed. There is no indication for change in therapy.  HYPERTENSION - The blood pressure is at target. No change in medications is indicated.

## 2016-02-27 NOTE — Patient Instructions (Signed)

## 2016-03-18 DIAGNOSIS — M810 Age-related osteoporosis without current pathological fracture: Secondary | ICD-10-CM | POA: Diagnosis not present

## 2016-03-25 DIAGNOSIS — M810 Age-related osteoporosis without current pathological fracture: Secondary | ICD-10-CM | POA: Diagnosis not present

## 2016-04-10 DIAGNOSIS — Z23 Encounter for immunization: Secondary | ICD-10-CM | POA: Diagnosis not present

## 2016-04-16 ENCOUNTER — Encounter: Payer: Self-pay | Admitting: Nurse Practitioner

## 2016-04-16 ENCOUNTER — Ambulatory Visit (INDEPENDENT_AMBULATORY_CARE_PROVIDER_SITE_OTHER): Payer: Medicare Other | Admitting: Nurse Practitioner

## 2016-04-16 VITALS — BP 140/80 | HR 60 | Resp 12 | Ht 62.0 in | Wt 138.4 lb

## 2016-04-16 DIAGNOSIS — Z01419 Encounter for gynecological examination (general) (routine) without abnormal findings: Secondary | ICD-10-CM

## 2016-04-16 DIAGNOSIS — C50812 Malignant neoplasm of overlapping sites of left female breast: Secondary | ICD-10-CM | POA: Diagnosis not present

## 2016-04-16 DIAGNOSIS — Z170 Estrogen receptor positive status [ER+]: Secondary | ICD-10-CM

## 2016-04-16 DIAGNOSIS — M810 Age-related osteoporosis without current pathological fracture: Secondary | ICD-10-CM | POA: Diagnosis not present

## 2016-04-16 DIAGNOSIS — N952 Postmenopausal atrophic vaginitis: Secondary | ICD-10-CM | POA: Diagnosis not present

## 2016-04-16 DIAGNOSIS — Z124 Encounter for screening for malignant neoplasm of cervix: Secondary | ICD-10-CM

## 2016-04-16 DIAGNOSIS — Z0000 Encounter for general adult medical examination without abnormal findings: Secondary | ICD-10-CM

## 2016-04-16 NOTE — Patient Instructions (Signed)

## 2016-04-16 NOTE — Progress Notes (Signed)
Patient ID: Traci [name], female   DOB: 1950-05-28, 65 y.o.   MRN: [id number]  65 y.o. G2P2002 Married  Caucasian Fe here for annual exam.  Prolia 03/18/16 for Osteoporosis.  She has not been so good on Vi td over the summer but plans to do better.    Twins are now 67 yrs old, the older twins are 67 yrs old - now with 5 grandsons.  No new problems this year. Husband had hip replacement.   Patient's last menstrual period was 05/07/1999 (approximate).          Sexually active: Yes.    The current method of family planning is post menopausal status.    Exercising: Yes.    Spin, Pilates, Walking Smoker:  no  Health Maintenance: Pap: 01/16/12, WNL, neg HR HPV MMG:01/17/16 right Mammo only, BI-Rads 1: Negative  Colonoscopy: 02/02/14, normal, recheck 5-7 years BMD:01/17/16, T Score:-3.40 Spine -3.40, Right Femur Neck -2.0 / Left Femur Neck -1.6-Osteoporosis TDaP: 01/11/11 Shingles: 05/18/09 Pneumonia: 04/2016 Hep C: No Labs: 02/2016, scanned into EPIC   reports that she quit smoking about 37 years ago. She has a 10.00 pack-year smoking history. She has never used smokeless tobacco. She reports that she drinks about 2.4 oz of alcohol per week . She reports that she does not use drugs.  Past Medical History:  Diagnosis Date  . ASHD (arteriosclerotic heart disease)    Mild septal hypertrophy, 2D ECHO 11/2006, Dr. Percival Spanish  . Breast cancer [facility])    PMH of, Dr. Marylene Buerger, Dr. Truddie Coco  . Facet syndrome, lumbar    Dr. Lynann Bologna  . Heart murmur   . Hyperlipidemia   . Hypertension   . Osteopenia   . Skin cancer, basal cell    Dr. Amy Martinique    Past Surgical History:  Procedure Laterality Date  . CHOLECYSTECTOMY  about 1993  . COLONOSCOPY      X 3 negative (last 2008, due 2015), Dr. Olevia Perches  . MASTECTOMY Left 11/2004   Dr. Marylene Buerger, also reduction of right breast  . ROTATOR CUFF REPAIR Left   . TRANSESOPHAGEAL ECHOCARDIOGRAM  2012   Overall left ventricular systolic function was  normal. Left ventricular ejection fraction was estimated, range being 55% to 60%. There were no left ventricular regional wall motional abnormalities. Left ventricular wall thickness was mildly increased. There was mild focal basal septal hypertrophy.    Current Outpatient Prescriptions  Medication Sig Dispense Refill  . amLODipine (NORVASC) 10 MG tablet Take 1 tablet (10 mg total) by mouth daily. 30 tablet 0  . benazepril (LOTENSIN) 40 MG tablet Take 1 tablet (40 mg total) by mouth daily. 30 tablet 0  . Calcium-Magnesium-Vitamin D (CALCIUM MAGNESIUM PO) Take 500 mg by mouth daily.     . Cholecalciferol (VITAMIN D) 1000 UNITS capsule Take 1,000 Units by mouth daily.     . pravastatin (PRAVACHOL) 20 MG tablet TAKE 1 TABLET (20 MG TOTAL) BY MOUTH DAILY. 90 tablet 3   No current facility-administered medications for this visit.     Family History  Problem Relation Age of Onset  . Hypertension Mother   . Coronary artery disease Mother     no MI  . Lung cancer Mother     smoker  . Colon cancer Father 70  . Coronary artery disease Father     no MI  . Prostate cancer Father   . Diabetes Father   . Hypertension Sister   . Cancer Maternal Grandfather  GI  . Diabetes Paternal Grandfather   . Stroke Neg Hx   . Esophageal cancer Neg Hx   . Stomach cancer Neg Hx   . Rectal cancer Neg Hx     ROS:  Pertinent items are noted in HPI.  Otherwise, a comprehensive ROS was negative.  Exam:   LMP 05/07/1999 (Approximate)    Ht Readings from Last 3 Encounters:  02/27/16 5\' 3"  (1.6 m)  02/12/16 5' 2.25" (1.581 m)  04/14/15 5' 2.25" (1.581 m)    General appearance: alert, cooperative and appears stated age Head: Normocephalic, without obvious abnormality, atraumatic Neck: no adenopathy, supple, symmetrical, trachea midline and thyroid normal to inspection and palpation Lungs: clear to auscultation bilaterally Breasts: normal appearance, no masses or tenderness, with reduction scars on the  right.  Left breast mastectomy with implant.  No new mass. Heart: regular rate and rhythm Abdomen: soft, non-tender; no masses,  no organomegaly Extremities: extremities normal, atraumatic, no cyanosis or edema Skin: Skin color, texture, turgor normal. No rashes or lesions Lymph nodes: Cervical, supraclavicular, and axillary nodes normal. No abnormal inguinal nodes palpated Neurologic: Grossly normal   Pelvic: External genitalia:  no lesions              Urethra:  normal appearing urethra with no masses, tenderness or lesions              Bartholin's and Skene's: normal                 Vagina: normal appearing vagina with normal color and discharge, no lesions              Cervix: anteverted              Pap taken: Yes.   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

## 2016-04-17 LAB — IPS PAP SMEAR ONLY

## 2016-04-18 NOTE — Progress Notes (Signed)
Encounter reviewed by Dr. Brook Amundson C. Silva.  

## 2016-06-12 DIAGNOSIS — L821 Other seborrheic keratosis: Secondary | ICD-10-CM | POA: Diagnosis not present

## 2016-06-12 DIAGNOSIS — D485 Neoplasm of uncertain behavior of skin: Secondary | ICD-10-CM | POA: Diagnosis not present

## 2016-06-12 DIAGNOSIS — L570 Actinic keratosis: Secondary | ICD-10-CM | POA: Diagnosis not present

## 2016-06-12 DIAGNOSIS — Z85828 Personal history of other malignant neoplasm of skin: Secondary | ICD-10-CM | POA: Diagnosis not present

## 2016-08-19 ENCOUNTER — Encounter: Payer: Self-pay | Admitting: Sports Medicine

## 2016-08-19 ENCOUNTER — Ambulatory Visit (INDEPENDENT_AMBULATORY_CARE_PROVIDER_SITE_OTHER): Payer: Medicare Other | Admitting: Sports Medicine

## 2016-08-19 ENCOUNTER — Ambulatory Visit
Admission: RE | Admit: 2016-08-19 | Discharge: 2016-08-19 | Disposition: A | Discharge status: Home / Self Care | Payer: Medicare Other | Source: Ambulatory Visit | Attending: Sports Medicine | Admitting: Sports Medicine

## 2016-08-19 VITALS — BP 132/66 | Ht 63.0 in | Wt 138.0 lb

## 2016-08-19 DIAGNOSIS — M5432 Sciatica, left side: Secondary | ICD-10-CM

## 2016-08-19 DIAGNOSIS — M25551 Pain in right hip: Secondary | ICD-10-CM | POA: Diagnosis not present

## 2016-08-19 DIAGNOSIS — G8929 Other chronic pain: Secondary | ICD-10-CM

## 2016-08-19 DIAGNOSIS — M5442 Lumbago with sciatica, left side: Secondary | ICD-10-CM

## 2016-08-19 DIAGNOSIS — M545 Low back pain: Secondary | ICD-10-CM | POA: Diagnosis not present

## 2016-08-19 NOTE — Assessment & Plan Note (Signed)
-   Chronic. Unclear etiology. Considering arthritis vs tight hip abductors. - Will obtain R hip x-ray and AP pelvis to r/o arthritis. - Patient declined depomedrol/toradol shot today, as symptoms less severe compared to when she initially made appointment

## 2016-08-19 NOTE — Assessment & Plan Note (Signed)
-   Intermittent and likely 2/2 spondylosis. - Ordered x-rays of lumbar spine (AP, lateral, flexion and extension views), as last imaging performed almost 10 years ago and having new symptoms - Will advise patient on safe exercises and next steps of treatment based on imaging results. For now, continue to avoid back extension exercises

## 2016-08-19 NOTE — Progress Notes (Signed)
[facility] Progress Note  Subjective:  Traci [name] is a 66 y.o. female who presents as NP for R hip/back pain and L leg burning pain. She says she first had low back pain about 11 years ago when picking up her twin grandchildren a lot. She was evaluated at that time and told she had an "idiopathic" condition and not to do back extensions. She has continued to have pain on and off but only recently has her right hip been bothering her enough to make an appointment for evaluation. She does not know if pain is coming from her back or her R hip. Describes it more as a discomfort. She is concerned she could have bursitis because externally rotating her hip to get into a car is very painful. Her masseuse has mentioned she could have a tight piriformis muscle. Walking, especially uphill, makes hip pain worse. Spin class doesn't hurt, nor does pilates. Using a foam roller on her back and legs helps some. Pain has improved some since time has passed since coming home from a trip to [place] that involved a lot of walking uphill.   She is unsure how long she has had left leg pain. She describes it as intermittent, sharp and burning. Notices it at night. It wakes her up. Happens maybe 3x a night and seems to happen in bursts--3 nights in a row then okay the next few days. Has not identified movements that bring it on.  ROS: No bladder or bowel incontinence, no fevers, no falls  Past Medical History:  Diagnosis Date  . ASHD (arteriosclerotic heart disease)    Mild septal hypertrophy, 2D ECHO 11/2006, Dr. Percival Spanish  . Breast cancer [facility])    PMH of, Dr. Marylene Buerger, Dr. Truddie Coco  multilpe sites overlaping, ER +  . Facet syndrome, lumbar ([place])    Dr. Lynann Bologna  . Heart murmur   . Hyperlipidemia   . Hypertension   . Osteopenia   . Skin cancer, basal cell    Dr. Amy Martinique    Allergies  Allergen Reactions  . Shrimp [Shellfish Allergy]     2014 Angioedema of the lips, pt had it since then with no  reaction  . Hydrocodone     nausea  . Vicodin [Hydrocodone-Acetaminophen]     nausea    Objective: Blood pressure 132/66, height 5\' 3"  (1.6 m), weight 138 lb (62.6 kg), last menstrual period 05/07/1999. Body mass index is 24.45 kg/m. Constitutional: Pleasant, well-appearing female in NAD Pulmonary/Chest: No respiratory distress.  Musculoskeletal: No malalignment of spine. No bruising at hips. Mild TTP over R greater trochanter. No midline spinal TTP. Pain with back extension. Full back flexion without discomfort. External rotation somewhat limited at hips (L > R). No pain with passive extension of legs. 5/5 strength with leg extension bilaterally and with hip abduction. Negative FABER.  Neurological: No decreased sensation over LEs. 2+ patellar and Achilles reflexes bilaterally. Able to lift big toes bilaterally.  Skin: Skin is warm and dry. No rash noted. Psychiatric: Normal mood and affect.  Vitals reviewed  Assessment/Plan: Right hip pain - Chronic. Unclear etiology. Considering arthritis vs tight hip abductors. - Will obtain R hip x-ray and AP pelvis to r/o arthritis. - Patient declined depomedrol/toradol shot today, as symptoms less severe compared to when she initially made appointment  Sciatica of left side - Intermittent and likely 2/2 spondylosis. - Ordered x-rays of lumbar spine (AP, lateral, flexion and extension views), as last imaging performed almost 10  years ago and having new symptoms - Will advise patient on safe exercises and next steps of treatment based on imaging results. For now, continue to avoid back extension exercises  Follow-up in 4 weeks to review results and re-assess pain.  Olene Floss, MD [place], PGY-2  Patient seen and evaluated with the resident. I agree with the above plan of care. I was able to evaluate the patient's x-rays. She has no significant degenerative changes in the right hip. She has a grade 2 spondylolisthesis  at L4-L5 with concomitant facet arthropathy. Disc space narrowing at this level as well. She will proceed with treatment as above and follow-up with me in 4 weeks. I will review her x-rays with her at that time. In the meantime she will speak with her Pilates instructor about abdominal strengthening exercises but she will continue to avoid any sort of lumbar spine extension.

## 2016-09-23 ENCOUNTER — Ambulatory Visit: Payer: Medicare Other | Admitting: Sports Medicine

## 2016-10-03 DIAGNOSIS — M810 Age-related osteoporosis without current pathological fracture: Secondary | ICD-10-CM | POA: Diagnosis not present

## 2016-12-02 ENCOUNTER — Telehealth: Payer: Self-pay | Admitting: Obstetrics & Gynecology

## 2016-12-02 NOTE — Telephone Encounter (Signed)
LMTCB/:NP/ .CX/LETTER SENT/RD

## 2017-01-29 DIAGNOSIS — Z1231 Encounter for screening mammogram for malignant neoplasm of breast: Secondary | ICD-10-CM | POA: Diagnosis not present

## 2017-01-29 DIAGNOSIS — Z853 Personal history of malignant neoplasm of breast: Secondary | ICD-10-CM | POA: Diagnosis not present

## 2017-02-03 DIAGNOSIS — H5211 Myopia, right eye: Secondary | ICD-10-CM | POA: Diagnosis not present

## 2017-02-03 DIAGNOSIS — H5202 Hypermetropia, left eye: Secondary | ICD-10-CM | POA: Diagnosis not present

## 2017-02-03 DIAGNOSIS — H04123 Dry eye syndrome of bilateral lacrimal glands: Secondary | ICD-10-CM | POA: Diagnosis not present

## 2017-02-03 DIAGNOSIS — H2513 Age-related nuclear cataract, bilateral: Secondary | ICD-10-CM | POA: Diagnosis not present

## 2017-02-10 ENCOUNTER — Encounter: Payer: Self-pay | Admitting: Obstetrics and Gynecology

## 2017-02-21 DIAGNOSIS — E7849 Other hyperlipidemia: Secondary | ICD-10-CM | POA: Diagnosis not present

## 2017-02-21 DIAGNOSIS — M810 Age-related osteoporosis without current pathological fracture: Secondary | ICD-10-CM | POA: Diagnosis not present

## 2017-02-21 DIAGNOSIS — Z0000 Encounter for general adult medical examination without abnormal findings: Secondary | ICD-10-CM | POA: Diagnosis not present

## 2017-02-21 DIAGNOSIS — I10 Essential (primary) hypertension: Secondary | ICD-10-CM | POA: Diagnosis not present

## 2017-02-25 NOTE — Progress Notes (Signed)
HPI The patient presents for evaluation of septal hypertrophy.  In 2016 an echo demonstrated that there is moderate hypertrophy of the septum but this is not causing any obstruction to flow.  She returns for follow up.  She has done very well over the year.  She is very athletic and has no complaints with this.  The patient denies any new symptoms such as chest discomfort, neck or arm discomfort. There has been no new shortness of breath, PND or orthopnea. There have been no reported palpitations, presyncope or syncope.   Allergies  Allergen Reactions  . Shrimp [Shellfish Allergy]     2014 Angioedema of the lips, pt had it since then with no reaction  . Hydrocodone     nausea  . Vicodin [Hydrocodone-Acetaminophen]     nausea    Current Outpatient Prescriptions  Medication Sig Dispense Refill  . amLODipine (NORVASC) 10 MG tablet Take 1 tablet (10 mg total) by mouth daily. 30 tablet 0  . benazepril (LOTENSIN) 40 MG tablet Take 1 tablet (40 mg total) by mouth daily. 30 tablet 0  . Calcium-Magnesium-Vitamin D (CALCIUM MAGNESIUM PO) Take 500 mg by mouth daily.     . Cholecalciferol (VITAMIN D) 1000 UNITS capsule Take 1,000 Units by mouth daily.     [name] denosumab (PROLIA) 60 MG/ML SOLN injection Inject 60 mg into the skin every 6 (six) months. Administer in upper arm, thigh, or abdomen    . pravastatin (PRAVACHOL) 20 MG tablet TAKE 1 TABLET (20 MG TOTAL) BY MOUTH DAILY. 90 tablet 3   No current facility-administered medications for this visit.     Past Medical History:  Diagnosis Date  . ASHD (arteriosclerotic heart disease)    Mild septal hypertrophy, 2D ECHO 11/2006, Dr. Percival Spanish  . Breast cancer [facility])    PMH of, Dr. Marylene Buerger, Dr. Truddie Coco  multilpe sites overlaping, ER +  . Facet syndrome, lumbar    Dr. Lynann Bologna  . Heart murmur   . Hyperlipidemia   . Hypertension   . Osteopenia   . Skin cancer, basal cell    Dr. Amy Martinique    Past Surgical History:  Procedure Laterality  Date  . CHOLECYSTECTOMY  about 1993  . COLONOSCOPY      X 3 negative (last 2008, due 2015), Dr. Olevia Perches  . MASTECTOMY Left 11/2004   Dr. Marylene Buerger, also reduction of right breast and reconstruction on the left.  [name] ROTATOR CUFF REPAIR Left   . TRANSESOPHAGEAL ECHOCARDIOGRAM  2012   Overall left ventricular systolic function was normal. Left ventricular ejection fraction was estimated, range being 55% to 60%. There were no left ventricular regional wall motional abnormalities. Left ventricular wall thickness was mildly increased. There was mild focal basal septal hypertrophy.    ROS:    As stated in the HPI and negative for all other systems.  PHYSICAL EXAM BP 130/84   Pulse (!) 58   Ht 5\' 3"  (1.6 m)   Wt 148 lb 12.8 oz (67.5 kg)   LMP 05/07/1999 (Approximate)   BMI 26.36 kg/m   GENERAL:  Well appearing NECK:  No jugular venous distention, waveform within normal limits, carotid upstroke brisk and symmetric, no bruits, no thyromegaly LUNGS:  Clear to auscultation bilaterally CHEST:  Unremarkable HEART:  PMI not displaced or sustained,S1 and S2 within normal limits, no S3, no S4, no clicks, no rubs, 2 out of 6 apical systolic murmur radiating slightly out the aortic outflow tract and increasing with  the strain phase of Valsalva no diastolic murmurs ABD:  Flat, positive bowel sounds normal in frequency in pitch, no bruits, no rebound, no guarding, no midline pulsatile mass, no hepatomegaly, no splenomegaly EXT:  2 plus pulses throughout, no edema, no cyanosis no clubbing   EKG:  Sinus rhythm, rate 58 , axis within normal limits, intervals within normal limits, no acute ST-T wave changes.  02/26/2017    ASSESSMENT AND PLAN   HYPERTROPHIC CARDIOMYOPATHY -  Or murmur maybe slightly more pronounced. Check an echocardiogram to further evaluate.   I will consider further work up to include SPEP, genetic testing for HCM panel and PYP scan for TTR-WT amyloidosis.  I will discuss this with  colleagues.  I am going to repeat a POET (Plain Old Exercise Treadmill) which has not been done in sometime to look at her BP response.    HYPERLIPIDEMIA - I will get a copy of her most recent labs from Dr. Brigitte Pulse.    HYPERTENSION - The blood pressure is at target. No change in medications is indicated. We will continue with therapeutic lifestyle changes (TLC).

## 2017-02-26 ENCOUNTER — Encounter: Payer: Self-pay | Admitting: Cardiology

## 2017-02-26 ENCOUNTER — Ambulatory Visit (INDEPENDENT_AMBULATORY_CARE_PROVIDER_SITE_OTHER): Payer: Medicare Other | Admitting: Cardiology

## 2017-02-26 VITALS — BP 130/84 | HR 58 | Ht 63.0 in | Wt 148.8 lb

## 2017-02-26 DIAGNOSIS — E785 Hyperlipidemia, unspecified: Secondary | ICD-10-CM

## 2017-02-26 DIAGNOSIS — I10 Essential (primary) hypertension: Secondary | ICD-10-CM

## 2017-02-26 DIAGNOSIS — I517 Cardiomegaly: Secondary | ICD-10-CM

## 2017-02-26 NOTE — Patient Instructions (Signed)
Medication Instructions:  Continue current medications  If you need a refill on your cardiac medications before your next appointment, please call your pharmacy.  Labwork: None Ordered   Testing/Procedures: Your physician has requested that you have an echocardiogram. Echocardiography is a painless test that uses sound waves to create images of your heart. It provides your doctor with information about the size and shape of your heart and how well your heart's chambers and valves are working. This procedure takes approximately one hour. There are no restrictions for this procedure.  Your physician has requested that you have an exercise tolerance test. For further information please visit [email or web address]. Please also follow instruction sheet, as given.  Follow-Up: Your physician wants you to follow-up in: 1 Year. You should receive a reminder letter in the mail two months in advance. If you do not receive a letter, please call our office [phone].    Thank you for choosing [facility]!!

## 2017-02-27 DIAGNOSIS — I422 Other hypertrophic cardiomyopathy: Secondary | ICD-10-CM | POA: Diagnosis not present

## 2017-02-27 DIAGNOSIS — Z23 Encounter for immunization: Secondary | ICD-10-CM | POA: Diagnosis not present

## 2017-02-27 DIAGNOSIS — I10 Essential (primary) hypertension: Secondary | ICD-10-CM | POA: Diagnosis not present

## 2017-02-27 DIAGNOSIS — M810 Age-related osteoporosis without current pathological fracture: Secondary | ICD-10-CM | POA: Diagnosis not present

## 2017-02-27 DIAGNOSIS — Z6826 Body mass index (BMI) 26.0-26.9, adult: Secondary | ICD-10-CM | POA: Diagnosis not present

## 2017-02-27 DIAGNOSIS — R7301 Impaired fasting glucose: Secondary | ICD-10-CM | POA: Diagnosis not present

## 2017-02-27 DIAGNOSIS — E7849 Other hyperlipidemia: Secondary | ICD-10-CM | POA: Diagnosis not present

## 2017-02-27 DIAGNOSIS — Z853 Personal history of malignant neoplasm of breast: Secondary | ICD-10-CM | POA: Diagnosis not present

## 2017-02-27 DIAGNOSIS — Z0000 Encounter for general adult medical examination without abnormal findings: Secondary | ICD-10-CM | POA: Diagnosis not present

## 2017-02-27 DIAGNOSIS — Z1389 Encounter for screening for other disorder: Secondary | ICD-10-CM | POA: Diagnosis not present

## 2017-02-28 DIAGNOSIS — Z1212 Encounter for screening for malignant neoplasm of rectum: Secondary | ICD-10-CM | POA: Diagnosis not present

## 2017-02-28 LAB — IFOBT (OCCULT BLOOD): IMMUNOLOGICAL FECAL OCCULT BLOOD TEST: NEGATIVE

## 2017-03-04 ENCOUNTER — Other Ambulatory Visit (HOSPITAL_COMMUNITY): Payer: Medicare Other

## 2017-03-05 ENCOUNTER — Ambulatory Visit (HOSPITAL_COMMUNITY): Payer: Medicare Other | Attending: Cardiovascular Disease

## 2017-03-05 ENCOUNTER — Other Ambulatory Visit: Payer: Self-pay

## 2017-03-05 DIAGNOSIS — I517 Cardiomegaly: Secondary | ICD-10-CM | POA: Diagnosis not present

## 2017-03-13 ENCOUNTER — Telehealth (HOSPITAL_COMMUNITY): Payer: Self-pay

## 2017-03-13 NOTE — Telephone Encounter (Signed)
Encounter complete. 

## 2017-03-18 ENCOUNTER — Ambulatory Visit (HOSPITAL_COMMUNITY)
Admission: RE | Admit: 2017-03-18 | Discharge: 2017-03-18 | Disposition: A | Discharge status: Home / Self Care | Payer: Medicare Other | Source: Ambulatory Visit | Attending: Cardiology | Admitting: Cardiology

## 2017-03-18 DIAGNOSIS — I517 Cardiomegaly: Secondary | ICD-10-CM | POA: Diagnosis not present

## 2017-03-18 LAB — EXERCISE TOLERANCE TEST
CHL CUP MPHR: 152 {beats}/min
CSEPED: 12 min
Estimated workload: 13.5 METS
Exercise duration (sec): 5 s
Peak HR: 151 {beats}/min
Percent HR: 99 %
RPE: 18
Rest HR: 65 {beats}/min

## 2017-04-07 DIAGNOSIS — M810 Age-related osteoporosis without current pathological fracture: Secondary | ICD-10-CM | POA: Diagnosis not present

## 2017-04-17 ENCOUNTER — Ambulatory Visit: Payer: Medicare Other | Admitting: Obstetrics and Gynecology

## 2017-04-18 ENCOUNTER — Ambulatory Visit: Payer: Medicare Other | Admitting: Nurse Practitioner

## 2017-08-05 ENCOUNTER — Telehealth: Payer: Self-pay | Admitting: Obstetrics and Gynecology

## 2017-08-05 NOTE — Telephone Encounter (Signed)
Left message regarding upcoming appointment has been canceled and needs to be rescheduled. °

## 2017-08-26 ENCOUNTER — Ambulatory Visit: Payer: Medicare Other | Admitting: Obstetrics and Gynecology

## 2017-09-03 ENCOUNTER — Other Ambulatory Visit: Payer: Self-pay

## 2017-09-03 ENCOUNTER — Encounter: Payer: Self-pay | Admitting: Obstetrics and Gynecology

## 2017-09-03 ENCOUNTER — Ambulatory Visit (INDEPENDENT_AMBULATORY_CARE_PROVIDER_SITE_OTHER): Payer: Medicare Other | Admitting: Obstetrics and Gynecology

## 2017-09-03 VITALS — BP 118/70 | HR 72 | Resp 16 | Ht 62.25 in | Wt 147.0 lb

## 2017-09-03 DIAGNOSIS — Z01419 Encounter for gynecological examination (general) (routine) without abnormal findings: Secondary | ICD-10-CM | POA: Diagnosis not present

## 2017-09-03 DIAGNOSIS — N3281 Overactive bladder: Secondary | ICD-10-CM | POA: Diagnosis not present

## 2017-09-03 LAB — POCT URINALYSIS DIPSTICK
BILIRUBIN UA: NEGATIVE
Blood, UA: NEGATIVE
Glucose, UA: NEGATIVE
KETONES UA: NEGATIVE
Leukocytes, UA: NEGATIVE
Nitrite, UA: NEGATIVE
Protein, UA: NEGATIVE
Urobilinogen, UA: 0.2 E.U./dL
pH, UA: 5 (ref 5.0–8.0)

## 2017-09-03 NOTE — Progress Notes (Signed)
67 y.o. G14P2002 Married Caucasian female here for annual exam.    Muscle strain in back while golfing.  Unable to do exercise because of this.   On Prolia for osteoporosis.  Doing through Rheumatology.   States she now has a bone in the bottom of her mouth noted by the dentist. (Can be due to teeth grinding.)  Urinary frequency and urgency.  Not new.  Saw urology years ago.  Back from [place].  GI system not back to normal yet.  No diarrhea.   PCP: Dr. Lutricia Feil    Patient's last menstrual period was 05/07/1999 (approximate).           Sexually active: Yes.    The current method of family planning is post menopausal status.    Exercising: Yes.    spin class, walk pilates Smoker:  no  Health Maintenance: Pap:  04/16/16 Pap smear Negative History of abnormal Pap:  no MMG:  01/29/17 BIRADS 1 negative/density a Colonoscopy:  02/02/14, normal, recheck 5-7 years BMD:   01/17/16  Result  Osteoporosis TDaP:  01/11/11 Gardasil:   n/a HIV and Hep C: patient has donated blood about 3-4 years ago Screening Labs:  PCP   reports that she quit smoking about 39 years ago. She has a 10.00 pack-year smoking history. She has never used smokeless tobacco. She reports that she drinks about 2.4 oz of alcohol per week. She reports that she does not use drugs.  Past Medical History:  Diagnosis Date  . ASHD (arteriosclerotic heart disease)    Mild septal hypertrophy, 2D ECHO 11/2006, Dr. Percival Spanish  . Breast cancer [facility])    PMH of, Dr. Marylene Buerger, Dr. Truddie Coco  multilpe sites overlaping, ER +  . Facet syndrome, lumbar    Dr. Lynann Bologna  . Heart murmur   . Hyperlipidemia   . Hypertension   . Osteopenia   . Skin cancer, basal cell    Dr. Amy Martinique    Past Surgical History:  Procedure Laterality Date  . CHOLECYSTECTOMY  about 1993  . COLONOSCOPY      X 3 negative (last 2008, due 2015), Dr. Olevia Perches  . MASTECTOMY Left 11/2004   Dr. Marylene Buerger, also reduction of right breast and reconstruction  on the left.  [name] ROTATOR CUFF REPAIR Left   . TRANSESOPHAGEAL ECHOCARDIOGRAM  2012   Overall left ventricular systolic function was normal. Left ventricular ejection fraction was estimated, range being 55% to 60%. There were no left ventricular regional wall motional abnormalities. Left ventricular wall thickness was mildly increased. There was mild focal basal septal hypertrophy.    Current Outpatient Medications  Medication Sig Dispense Refill  . amLODipine (NORVASC) 10 MG tablet Take 1 tablet (10 mg total) by mouth daily. 30 tablet 0  . benazepril (LOTENSIN) 40 MG tablet Take 1 tablet (40 mg total) by mouth daily. 30 tablet 0  . Calcium-Magnesium-Vitamin D (CALCIUM MAGNESIUM PO) Take 500 mg by mouth daily.     . Cholecalciferol (VITAMIN D) 1000 UNITS capsule Take 1,000 Units by mouth daily.     [name] denosumab (PROLIA) 60 MG/ML SOLN injection Inject 60 mg into the skin every 6 (six) months. Administer in upper arm, thigh, or abdomen    . pravastatin (PRAVACHOL) 20 MG tablet TAKE 1 TABLET (20 MG TOTAL) BY MOUTH DAILY. 90 tablet 3   No current facility-administered medications for this visit.     Family History  Problem Relation Age of Onset  . Hypertension Mother   .  Coronary artery disease Mother        no MI  . Lung cancer Mother        smoker  . Colon cancer Father 82  . Coronary artery disease Father        no MI  . Prostate cancer Father   . Diabetes Father   . Hypertension Sister   . Cancer Maternal Grandfather        GI  . Diabetes Paternal Grandfather   . Stroke Neg Hx   . Esophageal cancer Neg Hx   . Stomach cancer Neg Hx   . Rectal cancer Neg Hx     Review of Systems  Constitutional: Negative.   HENT: Negative.   Eyes: Negative.   Respiratory: Negative.   Cardiovascular: Negative.   Gastrointestinal: Negative.   Endocrine: Negative.   Genitourinary: Negative.   Musculoskeletal: Negative.   Skin: Negative.   Allergic/Immunologic: Negative.   Neurological:  Negative.   Hematological: Negative.   Psychiatric/Behavioral: Negative.     Exam:   BP 118/70 (BP Location: Right Arm, Patient Position: Sitting, Cuff Size: Normal)   Pulse 72   Resp 16   Ht 5' 2.25" (1.581 m)   Wt 147 lb (66.7 kg)   LMP 05/07/1999 (Approximate)   BMI 26.67 kg/m     General appearance: alert, cooperative and appears stated age Head: Normocephalic, without obvious abnormality, atraumatic Neck: no adenopathy, supple, symmetrical, trachea midline and thyroid normal to inspection and palpation Lungs: clear to auscultation bilaterally Breasts: scars of the right breast, no masses or tenderness, No nipple retraction or dimpling, No nipple discharge or bleeding, No axillary or supraclavicular adenopathy.  Left breast absent with implant in place. Heart: regular rate and rhythm Abdomen: soft, non-tender; no masses, no organomegaly Extremities: extremities normal, atraumatic, no cyanosis or edema Skin: Skin color, texture, turgor normal. No rashes or lesions Lymph nodes: Cervical, supraclavicular, and axillary nodes normal. No abnormal inguinal nodes palpated Neurologic: Grossly normal  Pelvic: External genitalia:  no lesions              Urethra:  normal appearing urethra with no masses, tenderness or lesions              Bartholins and Skenes: normal                 Vagina: normal appearing vagina with normal color and discharge, no lesions              Cervix: no lesions              Pap taken: No. Bimanual Exam:  Uterus:  normal size, contour, position, consistency, mobility, non-tender              Adnexa: no mass, fullness, tenderness              Rectal exam: Yes.  .  Confirms.              Anus:  normal sphincter tone, no lesions  Chaperone was present for exam.  Assessment:   Well woman visit with normal exam. Hx left breast cancer.  Mastectomy with reconstruction.  Osteoporosis.   On Prolia.  Cardiomegaly. Urinary urgency and frequency.  Overactive  bladder.  Bowel changes.  Plan: Mammogram screening. Recommended self breast awareness. Pap and HR HPV as above. Guidelines for Calcium, Vitamin D, regular exercise program including cardiovascular and weight bearing exercise. Continue Prolia through Rheumatology.  Urine dip - negative.  Discussed overactive bladder, bladder  irritants, potential treatment.  Probiotics/yogurt.  Follow up annually and prn.   After visit summary provided.

## 2017-09-03 NOTE — Patient Instructions (Addendum)
EXERCISE AND DIET:  We recommended that you start or continue a regular exercise program for good health. Regular exercise means any activity that makes your heart beat faster and makes you sweat.  We recommend exercising at least 30 minutes per day at least 3 days a week, preferably 4 or 5.  We also recommend a diet low in fat and sugar.  Inactivity, poor dietary choices and obesity can cause diabetes, heart attack, stroke, and kidney damage, among others.    ALCOHOL AND SMOKING:  Women should limit their alcohol intake to no more than 7 drinks/beers/glasses of wine (combined, not each!) per week. Moderation of alcohol intake to this level decreases your risk of breast cancer and liver damage. And of course, no recreational drugs are part of a healthy lifestyle.  And absolutely no smoking or even second hand smoke. Most people know smoking can cause heart and lung diseases, but did you know it also contributes to weakening of your bones? Aging of your skin?  Yellowing of your teeth and nails?  CALCIUM AND VITAMIN D:  Adequate intake of calcium and Vitamin D are recommended.  The recommendations for exact amounts of these supplements seem to change often, but generally speaking 600 mg of calcium (either carbonate or citrate) and 800 units of Vitamin D per day seems prudent. Certain women may benefit from higher intake of Vitamin D.  If you are among these women, your doctor will have told you during your visit.    PAP SMEARS:  Pap smears, to check for cervical cancer or precancers,  have traditionally been done yearly, although recent scientific advances have shown that most women can have pap smears less often.  However, every woman still should have a physical exam from her gynecologist every year. It will include a breast check, inspection of the vulva and vagina to check for abnormal growths or skin changes, a visual exam of the cervix, and then an exam to evaluate the size and shape of the uterus and  ovaries.  And after 69 years of age, a rectal exam is indicated to check for rectal cancers. We will also provide age appropriate advice regarding health maintenance, like when you should have certain vaccines, screening for sexually transmitted diseases, bone density testing, colonoscopy, mammograms, etc.   MAMMOGRAMS:  All women over 40 years old should have a yearly mammogram. Many facilities now offer a "3D" mammogram, which may cost around $50 extra out of pocket. If possible,  we recommend you accept the option to have the 3D mammogram performed.  It both reduces the number of women who will be called back for extra views which then turn out to be normal, and it is better than the routine mammogram at detecting truly abnormal areas.    COLONOSCOPY:  Colonoscopy to screen for colon cancer is recommended for all women at age 50.  We know, you hate the idea of the prep.  We agree, BUT, having colon cancer and not knowing it is worse!!  Colon cancer so often starts as a polyp that can be seen and removed at colonscopy, which can quite literally save your life!  And if your first colonoscopy is normal and you have no family history of colon cancer, most women don't have to have it again for 10 years.  Once every ten years, you can do something that may end up saving your life, right?  We will be happy to help you get it scheduled when you are ready.    Be sure to check your insurance coverage so you understand how much it will cost.  It may be covered as a preventative service at no cost, but you should check your particular policy.      Overactive Bladder, Adult Overactive bladder is a group of urinary symptoms. With overactive bladder, you may suddenly feel the need to pass urine (urinate) right away. After feeling this sudden urge, you might also leak urine if you cannot get to the bathroom fast enough (urinary incontinence). These symptoms might interfere with your daily work or social activities.  Overactive bladder symptoms may also wake you up at night. Overactive bladder affects the nerve signals between your bladder and your brain. Your bladder may get the signal to empty before it is full. Very sensitive muscles can also make your bladder squeeze too soon. What are the causes? Many things can cause an overactive bladder. Possible causes include:  Urinary tract infection.  Infection of nearby tissues, such as the prostate.  Prostate enlargement.  Being pregnant with twins or more (multiples).  Surgery on the uterus or urethra.  Bladder stones, inflammation, or tumors.  Drinking too much caffeine or alcohol.  Certain medicines, especially those that you take to help your body get rid of extra fluid (diuretics) by increasing urine production.  Muscle or nerve weakness, especially from: ? A spinal cord injury. ? Stroke. ? Multiple sclerosis. ? Parkinson disease.  Diabetes. This can cause a high urine volume that fills the bladder so quickly that the normal urge to urinate is triggered very strongly.  Constipation. A buildup of too much stool can put pressure on your bladder.  What increases the risk? You may be at greater risk for overactive bladder if you:  Are an older adult.  Smoke.  Are going through menopause.  Have prostate problems.  Have a neurological disease, such as stroke, dementia, Parkinson disease, or multiple sclerosis (MS).  Eat or drink things that irritate the bladder. These include alcohol, spicy food, and caffeine.  Are overweight or obese.  What are the signs or symptoms? The signs and symptoms of an overactive bladder include:  Sudden, strong urges to urinate.  Leaking urine.  Urinating eight or more times per day.  Waking up to urinate two or more times per night.  How is this diagnosed? Your health care provider may suspect overactive bladder based on your symptoms. The health care provider will do a physical exam and take  your medical history. Blood or urine tests may also be done. For example, you might need to have a bladder function test to check how well you can hold your urine. You might also need to see a health care provider who specializes in the urinary tract (urologist). How is this treated? Treatment for overactive bladder depends on the cause of your condition and whether it is mild or severe. Certain treatments can be done in your health care provider's office or clinic. You can also make lifestyle changes at home. Options include: Behavioral Treatments  Biofeedback. A specialist uses sensors to help you become aware of your body's signals.  Keeping a daily log of when you need to urinate and what happens after the urge. This may help you manage your condition.  Bladder training. This helps you learn to control the urge to urinate by following a schedule that directs you to urinate at regular intervals (timed voiding). At first, you might have to wait a few minutes after feeling the urge. In time,   you should be able to schedule bathroom visits an hour or more apart.  Kegel exercises. These are exercises to strengthen the pelvic floor muscles, which support the bladder. Toning these muscles can help you control urination, even if your bladder muscles are overactive. A specialist will teach you how to do these exercises correctly. They require daily practice.  Weight loss. If you are obese or overweight, losing weight might relieve your symptoms of overactive bladder. Talk to your health care provider about losing weight and whether there is a specific program or method that would work best for you.  Diet change. This might help if constipation is making your overactive bladder worse. Your health care provider or a dietitian can explain ways to change what you eat to ease constipation. You might also need to consume less alcohol and caffeine or drink other fluids at different times of the day.  Stopping  smoking.  Wearing pads to absorb leakage while you wait for other treatments to take effect. Physical Treatments  Electrical stimulation. Electrodes send gentle pulses of electricity to strengthen the nerves or muscles that help to control the bladder. Sometimes, the electrodes are placed outside of the body. In other cases, they might be placed inside the body (implanted). This treatment can take several months to have an effect.  Supportive devices. Women may need a plastic device that fits into the vagina and supports the bladder (pessary). Medicines Several medicines can help treat overactive bladder and are usually used along with other treatments. Some are injected into the muscles involved in urination. Others come in pill form. Your health care provider may prescribe:  Antispasmodics. These medicines block the signals that the nerves send to the bladder. This keeps the bladder from releasing urine at the wrong time.  Tricyclic antidepressants. These types of antidepressants also relax bladder muscles.  Surgery  You may have a device implanted to help manage the nerve signals that indicate when you need to urinate.  You may have surgery to implant electrodes for electrical stimulation.  Sometimes, very severe cases of overactive bladder require surgery to change the shape of the bladder. Follow these instructions at home:  Take medicines only as directed by your health care provider.  Use any implants or a pessary as directed by your health care provider.  Make any diet or lifestyle changes that are recommended by your health care provider. These might include: ? Drinking less fluid or drinking at different times of the day. If you need to urinate often during the night, you may need to stop drinking fluids early in the evening. ? Cutting down on caffeine or alcohol. Both can make an overactive bladder worse. Caffeine is found in coffee, tea, and sodas. ? Doing Kegel exercises  to strengthen muscles. ? Losing weight if you need to. ? Eating a healthy and balanced diet to prevent constipation.  Keep a journal or log to track how much and when you drink and also when you feel the need to urinate. This will help your health care provider to monitor your condition. Contact a health care provider if:  Your symptoms do not get better after treatment.  Your pain and discomfort are getting worse.  You have more frequent urges to urinate.  You have a fever. Get help right away if: You are not able to control your bladder at all. This information is not intended to replace advice given to you by your health care provider. Make sure you discuss any questions   you have with your health care provider. Document Released: 02/16/2009 Document Revised: 09/28/2015 Document Reviewed: 09/15/2013 Elsevier Interactive Patient Education  2018 [employer]vier Inc[employer]  

## 2017-11-13 DIAGNOSIS — H16103 Unspecified superficial keratitis, bilateral: Secondary | ICD-10-CM | POA: Diagnosis not present

## 2017-11-13 DIAGNOSIS — M5441 Lumbago with sciatica, right side: Secondary | ICD-10-CM | POA: Diagnosis not present

## 2017-11-13 DIAGNOSIS — H04123 Dry eye syndrome of bilateral lacrimal glands: Secondary | ICD-10-CM | POA: Diagnosis not present

## 2017-11-13 DIAGNOSIS — M9903 Segmental and somatic dysfunction of lumbar region: Secondary | ICD-10-CM | POA: Diagnosis not present

## 2017-11-13 DIAGNOSIS — H2513 Age-related nuclear cataract, bilateral: Secondary | ICD-10-CM | POA: Diagnosis not present

## 2017-11-13 DIAGNOSIS — M9905 Segmental and somatic dysfunction of pelvic region: Secondary | ICD-10-CM | POA: Diagnosis not present

## 2017-11-13 DIAGNOSIS — M9902 Segmental and somatic dysfunction of thoracic region: Secondary | ICD-10-CM | POA: Diagnosis not present

## 2017-11-14 DIAGNOSIS — M9903 Segmental and somatic dysfunction of lumbar region: Secondary | ICD-10-CM | POA: Diagnosis not present

## 2017-11-14 DIAGNOSIS — M9902 Segmental and somatic dysfunction of thoracic region: Secondary | ICD-10-CM | POA: Diagnosis not present

## 2017-11-14 DIAGNOSIS — M5441 Lumbago with sciatica, right side: Secondary | ICD-10-CM | POA: Diagnosis not present

## 2017-11-14 DIAGNOSIS — M810 Age-related osteoporosis without current pathological fracture: Secondary | ICD-10-CM | POA: Diagnosis not present

## 2017-11-14 DIAGNOSIS — M9905 Segmental and somatic dysfunction of pelvic region: Secondary | ICD-10-CM | POA: Diagnosis not present

## 2017-11-24 DIAGNOSIS — M9905 Segmental and somatic dysfunction of pelvic region: Secondary | ICD-10-CM | POA: Diagnosis not present

## 2017-11-24 DIAGNOSIS — M5441 Lumbago with sciatica, right side: Secondary | ICD-10-CM | POA: Diagnosis not present

## 2017-11-24 DIAGNOSIS — M9903 Segmental and somatic dysfunction of lumbar region: Secondary | ICD-10-CM | POA: Diagnosis not present

## 2017-11-24 DIAGNOSIS — M9902 Segmental and somatic dysfunction of thoracic region: Secondary | ICD-10-CM | POA: Diagnosis not present

## 2017-11-24 DIAGNOSIS — M810 Age-related osteoporosis without current pathological fracture: Secondary | ICD-10-CM | POA: Diagnosis not present

## 2017-12-02 DIAGNOSIS — M9903 Segmental and somatic dysfunction of lumbar region: Secondary | ICD-10-CM | POA: Diagnosis not present

## 2017-12-02 DIAGNOSIS — M5441 Lumbago with sciatica, right side: Secondary | ICD-10-CM | POA: Diagnosis not present

## 2017-12-02 DIAGNOSIS — M9902 Segmental and somatic dysfunction of thoracic region: Secondary | ICD-10-CM | POA: Diagnosis not present

## 2017-12-02 DIAGNOSIS — M9905 Segmental and somatic dysfunction of pelvic region: Secondary | ICD-10-CM | POA: Diagnosis not present

## 2017-12-22 DIAGNOSIS — M9902 Segmental and somatic dysfunction of thoracic region: Secondary | ICD-10-CM | POA: Diagnosis not present

## 2017-12-22 DIAGNOSIS — M9903 Segmental and somatic dysfunction of lumbar region: Secondary | ICD-10-CM | POA: Diagnosis not present

## 2017-12-22 DIAGNOSIS — M5441 Lumbago with sciatica, right side: Secondary | ICD-10-CM | POA: Diagnosis not present

## 2017-12-22 DIAGNOSIS — M9905 Segmental and somatic dysfunction of pelvic region: Secondary | ICD-10-CM | POA: Diagnosis not present

## 2018-01-02 DIAGNOSIS — M5441 Lumbago with sciatica, right side: Secondary | ICD-10-CM | POA: Diagnosis not present

## 2018-01-02 DIAGNOSIS — M9903 Segmental and somatic dysfunction of lumbar region: Secondary | ICD-10-CM | POA: Diagnosis not present

## 2018-01-02 DIAGNOSIS — M9902 Segmental and somatic dysfunction of thoracic region: Secondary | ICD-10-CM | POA: Diagnosis not present

## 2018-01-02 DIAGNOSIS — M9905 Segmental and somatic dysfunction of pelvic region: Secondary | ICD-10-CM | POA: Diagnosis not present

## 2018-02-10 DIAGNOSIS — H6123 Impacted cerumen, bilateral: Secondary | ICD-10-CM | POA: Diagnosis not present

## 2018-02-10 DIAGNOSIS — H838X3 Other specified diseases of inner ear, bilateral: Secondary | ICD-10-CM | POA: Diagnosis not present

## 2018-02-10 DIAGNOSIS — H9313 Tinnitus, bilateral: Secondary | ICD-10-CM | POA: Diagnosis not present

## 2018-02-10 DIAGNOSIS — H903 Sensorineural hearing loss, bilateral: Secondary | ICD-10-CM | POA: Diagnosis not present

## 2018-02-16 DIAGNOSIS — Z23 Encounter for immunization: Secondary | ICD-10-CM | POA: Diagnosis not present

## 2018-02-18 ENCOUNTER — Encounter: Payer: Self-pay | Admitting: Obstetrics and Gynecology

## 2018-03-02 DIAGNOSIS — M810 Age-related osteoporosis without current pathological fracture: Secondary | ICD-10-CM | POA: Diagnosis not present

## 2018-03-02 DIAGNOSIS — I10 Essential (primary) hypertension: Secondary | ICD-10-CM | POA: Diagnosis not present

## 2018-03-02 DIAGNOSIS — R82998 Other abnormal findings in urine: Secondary | ICD-10-CM | POA: Diagnosis not present

## 2018-03-02 DIAGNOSIS — R7301 Impaired fasting glucose: Secondary | ICD-10-CM | POA: Diagnosis not present

## 2018-03-02 DIAGNOSIS — E7849 Other hyperlipidemia: Secondary | ICD-10-CM | POA: Diagnosis not present

## 2018-03-02 NOTE — Progress Notes (Signed)
HPI The patient presents for evaluation of septal hypertrophy.  There was moderate hypertrophy on her last echo in October of last year.  Looking for any high risk findings she had a POET (Plain Old Exercise Treadmill).  This was normal with a normal BP response and no arrhythmias.    Since I last saw her she has done well.  She denies any cardiovascular symptoms.  She remains active doing Pilates, golfing and walking.  With that she denies any cardiovascular symptoms.  The patient denies any new symptoms such as chest discomfort, neck or arm discomfort. There has been no new shortness of breath, PND or orthopnea. There have been no reported palpitations, presyncope or syncope   Allergies  Allergen Reactions  . Shrimp [Shellfish Allergy]     2014 Angioedema of the lips, pt had it since then with no reaction  . Hydrocodone     nausea  . Vicodin [Hydrocodone-Acetaminophen]     nausea    Current Outpatient Medications  Medication Sig Dispense Refill  . amLODipine (NORVASC) 10 MG tablet Take 1 tablet (10 mg total) by mouth daily. 30 tablet 0  . benazepril (LOTENSIN) 40 MG tablet Take 1 tablet (40 mg total) by mouth daily. 30 tablet 0  . Calcium-Magnesium-Vitamin D (CALCIUM MAGNESIUM PO) Take 500 mg by mouth daily.     . Cholecalciferol (VITAMIN D) 1000 UNITS capsule Take 1,000 Units by mouth daily.     [name] denosumab (PROLIA) 60 MG/ML SOLN injection Inject 60 mg into the skin every 6 (six) months. Administer in upper arm, thigh, or abdomen    . pravastatin (PRAVACHOL) 20 MG tablet TAKE 1 TABLET (20 MG TOTAL) BY MOUTH DAILY. 90 tablet 3   No current facility-administered medications for this visit.     Past Medical History:  Diagnosis Date  . ASHD (arteriosclerotic heart disease)    Mild septal hypertrophy, 2D ECHO 11/2006, Dr. Percival Spanish  . Breast cancer [facility])    PMH of, Dr. Marylene Buerger, Dr. Truddie Coco  multilpe sites overlaping, ER +  . Facet syndrome, lumbar    Dr. Lynann Bologna  . Heart  murmur   . Hyperlipidemia   . Hypertension   . Osteopenia   . Skin cancer, basal cell    Dr. Amy Martinique    Past Surgical History:  Procedure Laterality Date  . CHOLECYSTECTOMY  about 1993  . COLONOSCOPY      X 3 negative (last 2008, due 2015), Dr. Olevia Perches  . MASTECTOMY Left 11/2004   Dr. Marylene Buerger, also reduction of right breast and reconstruction on the left.  [name] ROTATOR CUFF REPAIR Left   . TRANSESOPHAGEAL ECHOCARDIOGRAM  2012   Overall left ventricular systolic function was normal. Left ventricular ejection fraction was estimated, range being 55% to 60%. There were no left ventricular regional wall motional abnormalities. Left ventricular wall thickness was mildly increased. There was mild focal basal septal hypertrophy.    ROS:    As stated in the HPI and negative for all other systems.  PHYSICAL EXAM BP 120/80   Pulse 69   Ht 5\' 2"  (1.575 m)   Wt 150 lb 12.8 oz (68.4 kg)   LMP 05/07/1999 (Approximate)   BMI 27.58 kg/m   GENERAL:  Well appearing NECK:  No jugular venous distention, waveform within normal limits, carotid upstroke brisk and symmetric, no bruits, no thyromegaly LUNGS:  Clear to auscultation bilaterally CHEST:  Unremarkable HEART:  PMI not displaced or sustained,S1 and S2 within  normal limits, no S3, no S4, no clicks, no rubs, 2 out of 6 apical systolic murmur slightly radiating at the aortic outflow tract and slightly increasing with the strain phase of Valsalva, no diastolic murmurs ABD:  Flat, positive bowel sounds normal in frequency in pitch, no bruits, no rebound, no guarding, no midline pulsatile mass, no hepatomegaly, no splenomegaly EXT:  2 plus pulses throughout, no edema, no cyanosis no clubbing   EKG:  Sinus rhythm, rate 69, axis within normal limits, intervals within normal limits, no acute ST-T wave changes.   03/03/2018    ASSESSMENT AND PLAN   HYPERTROPHIC CARDIOMYOPATHY -  I did review her echo and other studies over time with some of my  colleagues.  The suggestion is an MRI to.  Of note provided this is confirmed she does not have any evidence of high risk findings or symptoms.  I would however suggest genetic testing and she would be very interested in this.   HYPERLIPIDEMIA - I reviewed her labs with her today.  Her LDL was 127 with an HDL of 81 which is slightly higher than previous.  Rather than change meds I have suggested a change to her diet.   We talked about a hybrid Mediterranean/plant-based diet.  HYPERTENSION - The blood pressure is at target.  No change in therapy.

## 2018-03-03 ENCOUNTER — Ambulatory Visit (INDEPENDENT_AMBULATORY_CARE_PROVIDER_SITE_OTHER): Payer: Medicare Other | Admitting: Cardiology

## 2018-03-03 ENCOUNTER — Encounter: Payer: Self-pay | Admitting: Cardiology

## 2018-03-03 VITALS — BP 120/80 | HR 69 | Ht 62.0 in | Wt 150.8 lb

## 2018-03-03 DIAGNOSIS — I421 Obstructive hypertrophic cardiomyopathy: Secondary | ICD-10-CM

## 2018-03-03 DIAGNOSIS — E785 Hyperlipidemia, unspecified: Secondary | ICD-10-CM | POA: Diagnosis not present

## 2018-03-03 NOTE — Patient Instructions (Signed)
Medication Instructions:  Continue current medications  If you need a refill on your cardiac medications before your next appointment, please call your pharmacy.  Labwork: None Ordered   If you have labs (blood work) drawn today and your tests are completely normal, you will receive your results only by: [name] MyChart Message (if you have MyChart) OR . A paper copy in the mail If you have any lab test that is abnormal or we need to change your treatment, we will call you to review the results.  Testing/Procedures: Your physician has requested that you have a MRI.  Follow-Up: . You will need a follow up appointment in After Test.   At [facility], you and your health needs are our priority.  As part of our continuing mission to provide you with exceptional heart care, we have created designated Provider Care Teams.  These Care Teams include your primary Cardiologist (physician) and Advanced Practice Providers (APPs -  Physician Assistants and Nurse Practitioners) who all work together to provide you with the care you need, when you need it.   Thank you for choosing [facility] at [facility]!!

## 2018-03-09 DIAGNOSIS — E7849 Other hyperlipidemia: Secondary | ICD-10-CM | POA: Diagnosis not present

## 2018-03-09 DIAGNOSIS — Z0000 Encounter for general adult medical examination without abnormal findings: Secondary | ICD-10-CM | POA: Diagnosis not present

## 2018-03-09 DIAGNOSIS — I10 Essential (primary) hypertension: Secondary | ICD-10-CM | POA: Diagnosis not present

## 2018-03-09 DIAGNOSIS — I422 Other hypertrophic cardiomyopathy: Secondary | ICD-10-CM | POA: Diagnosis not present

## 2018-03-09 DIAGNOSIS — Z1389 Encounter for screening for other disorder: Secondary | ICD-10-CM | POA: Diagnosis not present

## 2018-03-09 DIAGNOSIS — Z23 Encounter for immunization: Secondary | ICD-10-CM | POA: Diagnosis not present

## 2018-03-09 DIAGNOSIS — M810 Age-related osteoporosis without current pathological fracture: Secondary | ICD-10-CM | POA: Diagnosis not present

## 2018-03-09 DIAGNOSIS — R7301 Impaired fasting glucose: Secondary | ICD-10-CM | POA: Diagnosis not present

## 2018-03-09 DIAGNOSIS — Z853 Personal history of malignant neoplasm of breast: Secondary | ICD-10-CM | POA: Diagnosis not present

## 2018-03-09 DIAGNOSIS — M2550 Pain in unspecified joint: Secondary | ICD-10-CM | POA: Diagnosis not present

## 2018-03-09 DIAGNOSIS — Z6827 Body mass index (BMI) 27.0-27.9, adult: Secondary | ICD-10-CM | POA: Diagnosis not present

## 2018-03-20 DIAGNOSIS — Z1212 Encounter for screening for malignant neoplasm of rectum: Secondary | ICD-10-CM | POA: Diagnosis not present

## 2018-03-23 ENCOUNTER — Ambulatory Visit (HOSPITAL_COMMUNITY)
Admission: RE | Admit: 2018-03-23 | Discharge: 2018-03-23 | Disposition: A | Discharge status: Home / Self Care | Payer: Medicare Other | Source: Ambulatory Visit | Attending: Cardiology | Admitting: Cardiology

## 2018-03-23 DIAGNOSIS — I421 Obstructive hypertrophic cardiomyopathy: Secondary | ICD-10-CM | POA: Diagnosis not present

## 2018-03-23 LAB — CREATININE, SERUM
CREATININE: 0.65 mg/dL (ref 0.44–1.00)
GFR calc Af Amer: >60 mL/min (ref >60)
GFR calc non Af Amer: >60 mL/min (ref >60)

## 2018-03-23 MED ORDER — GADOBUTROL 1 MMOL/ML IV SOLN
7.5000 mL | Freq: Once | INTRAVENOUS | Status: AC | PRN
  Administered 2018-03-23: 7.5 mL via INTRAVENOUS

## 2018-04-01 ENCOUNTER — Telehealth: Payer: Self-pay | Admitting: *Deleted

## 2018-04-01 DIAGNOSIS — I422 Other hypertrophic cardiomyopathy: Secondary | ICD-10-CM

## 2018-04-01 NOTE — Telephone Encounter (Signed)
Pt made aware of her Cardiac MRI, pt was referred to Dr Lattie Corns for Genetic testing, pt voice understanding and thanks.

## 2018-04-01 NOTE — Telephone Encounter (Signed)
-----   Message from Minus Breeding, MD sent at 03/25/2018  4:47 PM EST ----- MRI consistent with HCM.  I will refer her to talk to Dr. Broadus John about possible genetic testing. Send results to Marton Redwood, MD. I called the patient.  Please make the genetics appt.

## 2018-04-18 ENCOUNTER — Encounter

## 2018-05-11 DIAGNOSIS — H524 Presbyopia: Secondary | ICD-10-CM | POA: Diagnosis not present

## 2018-05-11 DIAGNOSIS — H16103 Unspecified superficial keratitis, bilateral: Secondary | ICD-10-CM | POA: Diagnosis not present

## 2018-05-11 DIAGNOSIS — H04123 Dry eye syndrome of bilateral lacrimal glands: Secondary | ICD-10-CM | POA: Diagnosis not present

## 2018-05-11 DIAGNOSIS — H2513 Age-related nuclear cataract, bilateral: Secondary | ICD-10-CM | POA: Diagnosis not present

## 2018-06-01 DIAGNOSIS — L814 Other melanin hyperpigmentation: Secondary | ICD-10-CM | POA: Diagnosis not present

## 2018-06-01 DIAGNOSIS — Z85828 Personal history of other malignant neoplasm of skin: Secondary | ICD-10-CM | POA: Diagnosis not present

## 2018-06-01 DIAGNOSIS — L570 Actinic keratosis: Secondary | ICD-10-CM | POA: Diagnosis not present

## 2018-06-01 DIAGNOSIS — L821 Other seborrheic keratosis: Secondary | ICD-10-CM | POA: Diagnosis not present

## 2018-06-02 DIAGNOSIS — M810 Age-related osteoporosis without current pathological fracture: Secondary | ICD-10-CM | POA: Diagnosis not present

## 2018-07-14 ENCOUNTER — Encounter (INDEPENDENT_AMBULATORY_CARE_PROVIDER_SITE_OTHER): Payer: Self-pay | Admitting: Orthopaedic Surgery

## 2018-07-14 ENCOUNTER — Ambulatory Visit (INDEPENDENT_AMBULATORY_CARE_PROVIDER_SITE_OTHER): Payer: Medicare Other

## 2018-07-14 ENCOUNTER — Ambulatory Visit (INDEPENDENT_AMBULATORY_CARE_PROVIDER_SITE_OTHER): Payer: Medicare Other | Admitting: Orthopaedic Surgery

## 2018-07-14 DIAGNOSIS — M542 Cervicalgia: Secondary | ICD-10-CM

## 2018-07-14 DIAGNOSIS — M4722 Other spondylosis with radiculopathy, cervical region: Secondary | ICD-10-CM | POA: Diagnosis not present

## 2018-07-14 NOTE — Progress Notes (Signed)
Office Visit Note   Patient: Traci [name]           Date of Birth: 09/26/1950           MRN: [id number] Visit Date: 07/14/2018              Requested by: Marton Redwood, MD [address] [place], [place] [address] PCP: Marton Redwood, MD   Assessment & Plan: Visit Diagnoses:  1. Neck pain   2. Other spondylosis with radiculopathy, cervical region     Plan: Patient symptoms have improved from 2 weeks ago.  We will set her up for some home cervical traction and set her up with some formal physical therapy.  She gets recurrent symptoms will consider Medrol Dosepak.  I plan to recheck her in 6 weeks.  Copy of x-rays were made we discussed pathophysiology.  Follow-Up Instructions: Return in about 6 weeks (around 08/25/2018).   Orders:  Orders Placed This Encounter  Procedures  . XR Cervical Spine 2 or 3 views  . Ambulatory referral to Physical Therapy   No orders of the defined types were placed in this encounter.     Procedures: No procedures performed   Clinical Data: No additional findings.   Subjective: Chief Complaint  Patient presents with  . Neck - Pain    HPI 68 year old female had some neck pain and 2 weeks ago she was at [facility] and had severe neck pain with arm pain and tingling.  She still has some tingling in her hand she had 2 injections of IM Toradol but states the severe pain in her neck is resolved she has about 50% range of motion but not the severe pain.  She denies any problems with lower extremity weakness since that time.  She is concerned that she may have recurrence of her severe pain which she rated as a 9 out of 10.  Review of Systems positive for hypertrophic cardiomyopathy hyperlipidemia osteopenia lumbar spondylosis basal cell carcinoma otherwise negative is a pertains HPI.   Objective: Vital Signs: BP (!) 146/84   Pulse 61   Ht 5\' 4"  (1.626 m)   Wt 142 lb (64.4 kg)   LMP 05/07/1999 (Approximate)   BMI 24.37 kg/m   Physical  Exam Constitutional:      Appearance: She is well-developed.  HENT:     Head: Normocephalic.     Right Ear: External ear normal.     Left Ear: External ear normal.  Eyes:     Pupils: Pupils are equal, round, and reactive to light.  Neck:     Thyroid: No thyromegaly.     Trachea: No tracheal deviation.  Cardiovascular:     Rate and Rhythm: Normal rate.  Pulmonary:     Effort: Pulmonary effort is normal.  Abdominal:     Palpations: Abdomen is soft.  Skin:    General: Skin is warm and dry.  Neurological:     Mental Status: She is alert and oriented to person, place, and time.  Psychiatric:        Behavior: Behavior normal.     Ortho Exam patient has increased pain with cervical compression some relief with distraction brachial plexus tenderness on the right upper extremity reflexes are 2+ no isolated motor weakness.  Thenar hyperthenar strength is normal no lower extremity clonus hyperreflexia normal heel toe gait.  Specialty Comments:  No specialty comments available.  Imaging: Xr Cervical Spine 2 Or 3 Views  Result Date: 07/14/2018 AP lateral cervical  spine x-rays are obtained and reviewed.  This shows loss of normal cervical lordosis to space narrowing at C4-5 C5-6 with some anterior posterior spurring and loss of disc space height. Impression: Mid cervical spondylosis negative for acute changes.    PMFS History: Patient Active Problem List   Diagnosis Date Noted  . Right hip pain 08/19/2016  . Sciatica of left side 08/19/2016  . Basal cell carcinoma of skin 12/16/2012  . Family history of colon cancer 12/16/2012  . ANEMIA, MILD 04/16/2010  . HYPERTROPHIC CARDIOMYOPATHY 01/22/2010  . Palpitations 01/22/2010  . MURMUR 01/10/2009  . SPONDYLOSIS, LUMBAR 01/04/2009  . OSTEOPENIA 01/04/2009  . FASTING HYPERGLYCEMIA 01/04/2009  . HYPERLIPIDEMIA 02/04/2008  . BREAST CANCER, HX OF 02/04/2008  . Essential hypertension 09/18/2006   Past Medical History:  Diagnosis Date   . ASHD (arteriosclerotic heart disease)    Mild septal hypertrophy, 2D ECHO 11/2006, Dr. Percival Spanish  . Breast cancer [facility])    PMH of, Dr. Marylene Buerger, Dr. Truddie Coco  multilpe sites overlaping, ER +  . Facet syndrome, lumbar    Dr. Lynann Bologna  . Heart murmur   . Hyperlipidemia   . Hypertension   . Osteopenia   . Skin cancer, basal cell    Dr. Amy Martinique    Family History  Problem Relation Age of Onset  . Hypertension Mother   . Coronary artery disease Mother        no MI  . Lung cancer Mother        smoker  . Colon cancer Father 13  . Coronary artery disease Father        no MI  . Prostate cancer Father   . Diabetes Father   . Hypertension Sister   . Cancer Maternal Grandfather        GI  . Diabetes Paternal Grandfather   . Stroke Neg Hx   . Esophageal cancer Neg Hx   . Stomach cancer Neg Hx   . Rectal cancer Neg Hx     Past Surgical History:  Procedure Laterality Date  . CHOLECYSTECTOMY  about 1993  . COLONOSCOPY      X 3 negative (last 2008, due 2015), Dr. Olevia Perches  . MASTECTOMY Left 11/2004   Dr. Marylene Buerger, also reduction of right breast and reconstruction on the left.  [name] ROTATOR CUFF REPAIR Left   . TRANSESOPHAGEAL ECHOCARDIOGRAM  2012   Overall left ventricular systolic function was normal. Left ventricular ejection fraction was estimated, range being 55% to 60%. There were no left ventricular regional wall motional abnormalities. Left ventricular wall thickness was mildly increased. There was mild focal basal septal hypertrophy.   Social History   Occupational History  . Occupation: Retired    Fish farm manager: UNEMPLOYED  Tobacco Use  . Smoking status: Former Smoker    Packs/day: 1.00    Years: 10.00    Pack years: 10.00    Last attempt to quit: 05/06/1978    Years since quitting: 40.2  . Smokeless tobacco: Never Used  . Tobacco comment: smoked [place], up to 1 ppd  Substance and Sexual Activity  . Alcohol use: Yes    Alcohol/week: 4.0 standard drinks    Types: 4  Glasses of wine per week  . Drug use: No  . Sexual activity: Yes    Partners: Male

## 2018-07-20 ENCOUNTER — Encounter: Payer: Self-pay | Admitting: Physical Therapy

## 2018-07-20 ENCOUNTER — Other Ambulatory Visit: Payer: Self-pay

## 2018-07-20 ENCOUNTER — Ambulatory Visit: Payer: Medicare Other | Attending: Orthopedic Surgery | Admitting: Physical Therapy

## 2018-07-20 DIAGNOSIS — M542 Cervicalgia: Secondary | ICD-10-CM | POA: Diagnosis not present

## 2018-07-20 DIAGNOSIS — R293 Abnormal posture: Secondary | ICD-10-CM | POA: Diagnosis not present

## 2018-07-20 DIAGNOSIS — M5412 Radiculopathy, cervical region: Secondary | ICD-10-CM

## 2018-07-20 NOTE — Therapy (Signed)
[facility] [address], [place], [place], [address] Phone: [phone]   Fax:  [phone]  Physical Therapy Evaluation  Patient Details  Name: Traci [name] MRN: [id number] Date of Birth: 1950/08/05 Referring Provider (PT): Marlou Sa Tonna Corner, MD   Encounter Date: 07/20/2018  PT End of Session - 07/20/18 1358    Visit Number  1    Date for PT Re-Evaluation  08/31/18    Authorization Type  Medicare/BCBS    PT Start Time  0810   Pt late   PT Stop Time  0845    PT Time Calculation (min)  35 min    Activity Tolerance  Patient tolerated treatment well    Behavior During Therapy  [facility] for tasks assessed/performed       Past Medical History:  Diagnosis Date  . ASHD (arteriosclerotic heart disease)    Mild septal hypertrophy, 2D ECHO 11/2006, Dr. Percival Spanish  . Breast cancer [facility])    PMH of, Dr. Marylene Buerger, Dr. Truddie Coco  multilpe sites overlaping, ER +  . Facet syndrome, lumbar    Dr. Lynann Bologna  . Heart murmur   . Hyperlipidemia   . Hypertension   . Osteopenia   . Skin cancer, basal cell    Dr. Amy Martinique    Past Surgical History:  Procedure Laterality Date  . CHOLECYSTECTOMY  about 1993  . COLONOSCOPY      X 3 negative (last 2008, due 2015), Dr. Olevia Perches  . MASTECTOMY Left 11/2004   Dr. Marylene Buerger, also reduction of right breast and reconstruction on the left.  [name] ROTATOR CUFF REPAIR Left   . TRANSESOPHAGEAL ECHOCARDIOGRAM  2012   Overall left ventricular systolic function was normal. Left ventricular ejection fraction was estimated, range being 55% to 60%. There were no left ventricular regional wall motional abnormalities. Left ventricular wall thickness was mildly increased. There was mild focal basal septal hypertrophy.    There were no vitals filed for this visit.   Subjective Assessment - 07/20/18 0812    Subjective  Pt reports onset of neck pain on waking approx 1 month ago, then traveled for a vacation  and pain worsened.  Severe neck pain with Rt arm tingling in digits 2-5 and dull ache throughout arm.  Current symtpoms include ongoing arm ache on Rt UE which is more intermittent and limited neck mobility.  Pain is worse with Rt Rot.  Pt has an over the door home traction unit but hasn't used it.      Pertinent History  osteoporosis    Limitations  House hold activities;Lifting    Diagnostic tests  xrays    Patient Stated Goals  golf course as sooon as possible, sleeping    Currently in Pain?  Yes    Pain Score  1     Pain Location  Neck    Pain Orientation  Right    Pain Descriptors / Indicators  Aching;Dull    Pain Type  Chronic pain    Pain Radiating Towards  Rt UE into fingers 2-5    Pain Onset  More than a month ago    Pain Frequency  Intermittent    Aggravating Factors   turning head to Rt > Lt, sleeping    Pain Relieving Factors  heat    Effect of Pain on Daily Activities  golf, household activities         [facility] PT Assessment - 07/20/18 0001      Assessment  Medical Diagnosis  M54.2 (ICD-10-CM) - Neck pain    Referring Provider (PT)  Marlou Sa, Tonna Corner, MD    Onset Date/Surgical Date  --   one month ago   Hand Dominance  Right    Next MD Visit  08/25/18    Prior Therapy  none      Precautions   Precautions  None      Restrictions   Weight Bearing Restrictions  No      Balance Screen   Has the patient fallen in the past 6 months  No      [place] residence      Prior Function   Level of Independence  Independent    Vocation  Retired    Leisure  golf, spin class, grandchildren      Cognition   Overall Cognitive Status  Within Functional Limits for tasks assessed      Observation/Other Assessments   Observations  lower cervical extension hinge present    Focus on Therapeutic Outcomes (FOTO)   51%   goal 38%     Sensation   Light Touch  Appears Intact      Posture/Postural Control   Posture/Postural Control   Postural limitations    Posture Comments  extension hinge present lower cervical      ROM / Strength   AROM / PROM / Strength  AROM;Strength      AROM   Overall AROM Comments  bil UEs WNL    AROM Assessment Site  Cervical    Cervical Flexion  35   pain   Cervical Extension  35    Cervical - Right Side Bend  25    Cervical - Left Side Bend  25    Cervical - Right Rotation  30   pain   Cervical - Left Rotation  40      Strength   Overall Strength Comments  5/5 cervical and UEs, Rt grip 50, Lt grip 45      Flexibility   Soft Tissue Assessment /Muscle Length  --   trigger points present: upper trap Rt>Lt     Palpation   Spinal mobility  poor upper cervical nodding O/A, poor upglides bil facets C4-C7, poor downglides C7-T3 bil    Palpation comment  Rt>Lt upper trap, SO, cervical paraspinals, rhomboids      Special Tests    Special Tests  Cervical    Cervical Tests  Spurling's      Spurling's   Findings  Positive    Side  Right    Comment  pain, no UE reproduction                Objective measurements completed on examination: See above findings.      [facility] Adult PT Treatment/Exercise - 07/20/18 0001      Modalities   Modalities  Moist Heat      Moist Heat Therapy   Number Minutes Moist Heat  10 Minutes    Moist Heat Location  Cervical       Trigger Point Dry Needling - 07/20/18 0001    Consent Given?  Yes    Education Handout Provided  Yes    Muscles Treated Head and Neck  Upper trapezius   Right          PT Education - 07/20/18 0847    Education Details  Access Code: [email or web address]     Person(s) Educated  Patient  Methods  Explanation;Demonstration    Comprehension  Verbalized understanding;Returned demonstration       PT Short Term Goals - 07/20/18 1408      PT SHORT TERM GOAL #1   Title  Pt will be I in initial HEP to improve mobility and posture    Time  3    Period  Weeks    Status  New    Target Date  08/10/18      PT SHORT  TERM GOAL #2   Title  Pt will display improved bil rotation to at least 50 deg to improve activities such as driving.    Time  3    Period  Weeks    Status  New    Target Date  08/10/18      PT SHORT TERM GOAL #3   Title  Pt will report overall reduction of pain thorughout daily tasks to no more than 3/10.    Time  4    Period  Weeks    Status  New    Target Date  08/17/18      PT SHORT TERM GOAL #4   Title  Pt will report implementation of proper posture for cervical and thoracic regions during daily tasks to reduce strain and pain on neck.    Time  4    Period  Weeks    Status  New    Target Date  08/17/18        PT Long Term Goals - 07/20/18 1410      PT LONG TERM GOAL #1   Title  Pt will be I in advanced HEP to avoid future flare ups and posture/strength.    Time  6    Period  Weeks    Status  New    Target Date  08/31/18      PT LONG TERM GOAL #2   Title  pt will reduce FOTO to </= 38% to demo less limitation in daily tasks.    Time  6    Period  Weeks    Status  New    Target Date  08/31/18      PT LONG TERM GOAL #3   Title  Pt will achieve cervical ROM of at least 60 deg bil rot and 50 deg flexion to improve activities such as golf and driving.    Time  6    Period  Weeks    Status  New    Target Date  08/31/18      PT LONG TERM GOAL #4   Title  Pt will report overall reduction of pain by at least 75% including at night for improved night's sleep.    Time  6    Status  New    Target Date  08/31/18      PT LONG TERM GOAL #5   Title  Pt will be able to fully return to golf and cycling classes with pain not to exceed 2/10.    Time  6    Period  Weeks    Status  New    Target Date  08/31/18             Plan - 07/20/18 1404    Clinical Impression Statement  Pt with onset of acute neck and Rt UE pain and tingling approx. 1 month ago on waking.  Pain worsened when traveling and has improved again with anti-inflammatories.  Pain currently averages  4/10 but was 8/10 at  worst.  Current Rt UE symptoms are in digits 2-5 and are described as dull vs pins/needles.  She presents with stiff/guarded neck posture and has limitations in cervical ROM in flexion, extension and bil Rot Rt>Lt.  Limited spinal mobility is present in upper and lower cervical and upper thoracic spine bil.  Tension and trigger points are present in Rt>Lt upper trap, SO muscle group, cervical paraspinals, deep multifidus C4-C6.  Grip strength, UE strength and cervical strength is WNL and symmetrical bil.  Pt will benefit from manual therapy including dry needling, soft tissue mobilization and joint mobilization, postural re-ed, scapular and cervical stabilization, and Pt education for safe return to golf and spin classes with proper posture and support to avoid flare up.      Personal Factors and Comorbidities  Comorbidity 1    Comorbidities  osteoporosis, controlled with meds    Examination-Activity Limitations  Sleep;Lift;Carry    Examination-Participation Restrictions  Yard Work;Cleaning;Community Activity    Stability/Clinical Decision Making  Stable/Uncomplicated    Clinical Decision Making  Low    Rehab Potential  Excellent    PT Frequency  2x / week    PT Duration  6 weeks    PT Treatment/Interventions  ADLs/Self Care Home Management;Cryotherapy;Electrical Stimulation;Iontophoresis 4mg /ml Dexamethasone;Moist Heat;Traction;Functional mobility training;Therapeutic exercise;Therapeutic activities;Neuromuscular re-education;Patient/family education;Manual techniques;Passive range of motion;Dry needling;Taping;Spinal Manipulations;Joint Manipulations    PT Next Visit Plan  f/u on DN #1, continue manual for cervical mobs, DN, soft tissue, update HEP, add more ROM, stretching, begin scap/cerv stability    PT Home Exercise Plan  Access Code: [email or web address]     Consulted and Agree with Plan of Care  Patient       Patient will benefit from skilled therapeutic intervention in order to  improve the following deficits and impairments:  Decreased mobility, Hypomobility, Increased muscle spasms, Decreased range of motion, Improper body mechanics, Decreased activity tolerance, Impaired flexibility, Postural dysfunction, Pain  Visit Diagnosis: Cervicalgia - Plan: PT plan of care cert/re-cert  Radiculopathy, cervical region - Plan: PT plan of care cert/re-cert  Abnormal posture - Plan: PT plan of care cert/re-cert     Problem List Patient Active Problem List   Diagnosis Date Noted  . Other spondylosis with radiculopathy, cervical region 07/14/2018  . Right hip pain 08/19/2016  . Sciatica of left side 08/19/2016  . Basal cell carcinoma of skin 12/16/2012  . Family history of colon cancer 12/16/2012  . ANEMIA, MILD 04/16/2010  . HYPERTROPHIC CARDIOMYOPATHY 01/22/2010  . Palpitations 01/22/2010  . MURMUR 01/10/2009  . SPONDYLOSIS, LUMBAR 01/04/2009  . OSTEOPENIA 01/04/2009  . FASTING HYPERGLYCEMIA 01/04/2009  . HYPERLIPIDEMIA 02/04/2008  . BREAST CANCER, HX OF 02/04/2008  . Essential hypertension 09/18/2006    Baruch Merl, PT 07/20/18 2:19 PM   [facility] [address] [place], [place], [address] Phone: [phone]   Fax:  [phone]  Name: VIELKA KLINEDINST MRN: [id number] Date of Birth: 06-23-1950

## 2018-07-20 NOTE — Patient Instructions (Signed)
Access Code: [email or web address]  URL: [email or web address]  Date: 07/20/2018  Prepared by: Venetia Night Beuhring   Exercises  Neck Rotation - 10 reps - 3 sets - 1x daily - 7x weekly  Patient Education  Trigger Point Dry Needling

## 2018-07-24 ENCOUNTER — Ambulatory Visit: Payer: Medicare Other | Admitting: Physical Therapy

## 2018-07-27 ENCOUNTER — Ambulatory Visit: Payer: Medicare Other | Admitting: Physical Therapy

## 2018-07-29 ENCOUNTER — Ambulatory Visit: Payer: Medicare Other | Admitting: Physical Therapy

## 2018-08-05 ENCOUNTER — Encounter: Payer: Medicare Other | Admitting: Physical Therapy

## 2018-08-07 ENCOUNTER — Encounter: Payer: Medicare Other | Admitting: Physical Therapy

## 2018-08-10 ENCOUNTER — Encounter: Payer: Medicare Other | Admitting: Physical Therapy

## 2018-08-17 ENCOUNTER — Encounter: Payer: Medicare Other | Admitting: Physical Therapy

## 2018-08-19 ENCOUNTER — Encounter: Payer: Medicare Other | Admitting: Physical Therapy

## 2018-08-24 ENCOUNTER — Encounter: Payer: Medicare Other | Admitting: Physical Therapy

## 2018-08-25 ENCOUNTER — Ambulatory Visit (INDEPENDENT_AMBULATORY_CARE_PROVIDER_SITE_OTHER): Payer: Medicare Other | Admitting: Orthopaedic Surgery

## 2018-08-26 ENCOUNTER — Encounter: Payer: Medicare Other | Admitting: Physical Therapy

## 2018-08-31 ENCOUNTER — Encounter: Payer: Medicare Other | Admitting: Physical Therapy

## 2018-09-02 ENCOUNTER — Encounter: Payer: Medicare Other | Admitting: Physical Therapy

## 2018-09-07 ENCOUNTER — Telehealth: Payer: Self-pay | Admitting: *Deleted

## 2018-09-07 ENCOUNTER — Ambulatory Visit: Payer: Medicare Other | Admitting: Obstetrics and Gynecology

## 2018-09-07 DIAGNOSIS — I422 Other hypertrophic cardiomyopathy: Secondary | ICD-10-CM

## 2018-09-07 NOTE — Telephone Encounter (Signed)
-----  Message from Minus Breeding, MD sent at 09/07/2018 12:05 PM EDT ----- Regarding: FW: To place genetic test order in EPIC  ----- Message ----- From: Debbe Mounts, PhD Sent: 09/07/2018  12:00 PM EDT To: Minus Breeding, MD Subject: To place genetic test order in EPIC            Good afternoon, [facility] you are staying well and safe during this time.  I will be seeing your patient, Traci [name] on RAX09 at 9 am. In preparation for this visit, could you please place a test order for genetic testing of HCM in Epic (Go to lab orders, select [facility] and then select HCM).  If she is agreeable to genetic testing and it is warranted, she will be mailed a saliva kit with instructions on collecting saliva and a [employer] retun label.   I will keep you posted on her test result once it is completed.  Take care [facility]

## 2018-09-15 NOTE — Telephone Encounter (Signed)
Lab order placed.

## 2018-09-17 ENCOUNTER — Ambulatory Visit: Payer: Medicare Other | Admitting: Genetic Counselor

## 2018-09-17 ENCOUNTER — Other Ambulatory Visit: Payer: Self-pay

## 2018-09-25 NOTE — Progress Notes (Signed)
Pre Test GC  Due to the COVID-19 pandemic, patient consents to having a virtual genetic consult and for the visit details to be documented in her notes. Patient identity was confirmed using two unique identifiers of full name and date of birth   Referral Reason  Traci [name] was referred for genetic testing of HCM by Dr. Percival Spanish subsequent to findings in recent MRI demonstrating mid-focal basal septal hypertrophy of 1.7cm with scar in mid-wall basal and mid-wall inferoseptal region.  Genetic Consultation Notes  Traci [name] was counseled on the genetics of hypertrophic cardiomyopathy (HCM), namely its autosomal dominant inheritance. We also talked about incomplete penetrance, variable expression and digenic/compound mutations that can be seen in some patients with HCM. We briefly discussed the inheritance pattern and treatment plans for the infiltrative cardiomyopathies that present as HCM phenocopies.   We walked through the process of genetic testing. I explained to her that there are three possible outcomes of genetic testing; namely positive, negative and variant of unknown significance. A positive outcome can be expected in about 70%-75% of HCM cases that do not have risk factors for HCM, present early in life with increased severity and have a family history of sudden cardiac death and/or a relative that has been diagnosed with HCM. A negative test does not exclude a genetic basis for HCM. Limitations in current genetic testing methodology can produce a negative result. Variants of unknown significance (VUS) are typically observed in about 10%-15% of cases. However, this number can increase if genetic testing is performed by a laboratory that employs extremely large gene panels of 9 or more sarcomeric genes. I explained to her that typically a VUS is so classified if the variant is not well understood as very few individuals have been reported to harbor this variant or its role in gene function has not been  elucidated. The potential outcomes of genetic testing and subsequent management of at-risk family members were discussed so as to manage expectations. Her medical and 5-generation family history was obtained. See details below-  Traci [name] (III.5 on pedigree) is a very pleasant 68 year-old Caucasian lady who was first told that she had an enlarged heart at age 85. She reports going to her PCP in 2008 for her hypertension and mitral valve prolapse. She was referred to a cardiologist and underwent imaging studies that indicated wall thickness suggestive of HCM. She has been seeking annual cardiology sureveillance since then.   She tells me that she is mostly asymptomatic and denies having dyspnea, chest pains, dizziness or fatigue. She does report episodic rapid heart beats and informs me on one syncopal episode at age 68 when she collapsed as soon as she got off the airplane. She attributed this to fatigue at that time and did not pursue medical care for this event.   Traditional Risk Factors Traci [name] was diagnosed with hypertension nearly 30 years ago but states that it is well controlled by medication. She does not report aortic valve stenosis that can lead to cardiac hypertrophy.  Family history Traci [name] (III.5) is the middle child of three. Her elder brother (III.4) died in a car accident at age 32. She states that the death was due to driving on an icy road and not due to an underlying health condition. Her younger sister (III.6) is age 57 and in good health. She has not been screened for HCM.  Traci [name] has two sons, ages 53 and 14 (IV.1. IV.2). Her two sons, their children (V.1-V.6) and her sisters kids (  IV.3, IV.4) are in good health.  Traci [name] mother (II.3) died of lung cancer at age 57. There is no history of heart disease in Traci [name] maternal lineage.  Traci [name] father (II.2) was found to have an enlarged heart and was later diagnosed with CHF at age 44. He reportedly died of  CHF at age 8. She states that he was healthy and did not have any symptoms prior to being diagnosed with an enlarged heart. His sister (II.1) died of Alzheimers disease in her 53s and her children (III.1-III.3) are currently in good health. His parents (I.1, I.2) also did not have heart disease.  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 She also reports her dad having an enlarged heart and subsequent CHF. It seems likely that she may have inherited the condition from her father with late-onset of clinical presentation. Since HCM is an autosomal dominant condition, her sons are at a 50% risk of inheriting her condition. Genetic testing for HCM is recommended. However, as her insurance will not cover genetic testing, she would prefer regular surveillance and management of her condition. Likewise, it is recommended that her children seek regular cardiology screening for HCM. She verbalized understanding.  Please note that the patient has not been counseled in this visit on personal, cultural or ethical issues that she may face due to her heart condition.    Traci [name], Ph.D, [facility] Clinical Molecular Geneticist

## 2018-09-30 DIAGNOSIS — M810 Age-related osteoporosis without current pathological fracture: Secondary | ICD-10-CM | POA: Diagnosis not present

## 2018-12-04 DIAGNOSIS — M810 Age-related osteoporosis without current pathological fracture: Secondary | ICD-10-CM | POA: Diagnosis not present

## 2019-02-08 ENCOUNTER — Encounter: Payer: Self-pay | Admitting: Obstetrics and Gynecology

## 2019-02-08 DIAGNOSIS — Z1231 Encounter for screening mammogram for malignant neoplasm of breast: Secondary | ICD-10-CM | POA: Diagnosis not present

## 2019-02-08 DIAGNOSIS — Z853 Personal history of malignant neoplasm of breast: Secondary | ICD-10-CM | POA: Diagnosis not present

## 2019-02-09 ENCOUNTER — Encounter: Payer: Self-pay | Admitting: Gastroenterology

## 2019-02-09 DIAGNOSIS — H838X3 Other specified diseases of inner ear, bilateral: Secondary | ICD-10-CM | POA: Diagnosis not present

## 2019-02-09 DIAGNOSIS — H903 Sensorineural hearing loss, bilateral: Secondary | ICD-10-CM | POA: Diagnosis not present

## 2019-02-17 ENCOUNTER — Encounter: Payer: Self-pay | Admitting: Gastroenterology

## 2019-02-18 DIAGNOSIS — Z23 Encounter for immunization: Secondary | ICD-10-CM | POA: Diagnosis not present

## 2019-03-11 DIAGNOSIS — E7849 Other hyperlipidemia: Secondary | ICD-10-CM | POA: Diagnosis not present

## 2019-03-11 DIAGNOSIS — R7301 Impaired fasting glucose: Secondary | ICD-10-CM | POA: Diagnosis not present

## 2019-03-11 DIAGNOSIS — M810 Age-related osteoporosis without current pathological fracture: Secondary | ICD-10-CM | POA: Diagnosis not present

## 2019-03-12 ENCOUNTER — Telehealth: Payer: Self-pay | Admitting: *Deleted

## 2019-03-12 NOTE — Telephone Encounter (Signed)
Pt no show PV today. Patient called, no answer. Left message for her to call us back today by 5 pm to reschedule the Pre-visit or the colonoscopy would be cancelled.

## 2019-03-16 DIAGNOSIS — I10 Essential (primary) hypertension: Secondary | ICD-10-CM | POA: Diagnosis not present

## 2019-03-16 DIAGNOSIS — R82998 Other abnormal findings in urine: Secondary | ICD-10-CM | POA: Diagnosis not present

## 2019-03-17 DIAGNOSIS — I422 Other hypertrophic cardiomyopathy: Secondary | ICD-10-CM | POA: Diagnosis not present

## 2019-03-17 DIAGNOSIS — Z0000 Encounter for general adult medical examination without abnormal findings: Secondary | ICD-10-CM | POA: Diagnosis not present

## 2019-03-17 DIAGNOSIS — M2550 Pain in unspecified joint: Secondary | ICD-10-CM | POA: Diagnosis not present

## 2019-03-17 DIAGNOSIS — I10 Essential (primary) hypertension: Secondary | ICD-10-CM | POA: Diagnosis not present

## 2019-03-17 DIAGNOSIS — Z1331 Encounter for screening for depression: Secondary | ICD-10-CM | POA: Diagnosis not present

## 2019-03-17 DIAGNOSIS — M810 Age-related osteoporosis without current pathological fracture: Secondary | ICD-10-CM | POA: Diagnosis not present

## 2019-03-17 DIAGNOSIS — M5412 Radiculopathy, cervical region: Secondary | ICD-10-CM | POA: Diagnosis not present

## 2019-03-17 DIAGNOSIS — R7301 Impaired fasting glucose: Secondary | ICD-10-CM | POA: Diagnosis not present

## 2019-03-17 DIAGNOSIS — E785 Hyperlipidemia, unspecified: Secondary | ICD-10-CM | POA: Diagnosis not present

## 2019-03-17 DIAGNOSIS — Z853 Personal history of malignant neoplasm of breast: Secondary | ICD-10-CM | POA: Diagnosis not present

## 2019-03-17 DIAGNOSIS — K219 Gastro-esophageal reflux disease without esophagitis: Secondary | ICD-10-CM | POA: Diagnosis not present

## 2019-03-24 DIAGNOSIS — Z20828 Contact with and (suspected) exposure to other viral communicable diseases: Secondary | ICD-10-CM | POA: Diagnosis not present

## 2019-03-24 DIAGNOSIS — Z6825 Body mass index (BMI) 25.0-25.9, adult: Secondary | ICD-10-CM | POA: Diagnosis not present

## 2019-03-25 ENCOUNTER — Ambulatory Visit (AMBULATORY_SURGERY_CENTER): Payer: Medicare Other | Admitting: *Deleted

## 2019-03-25 ENCOUNTER — Other Ambulatory Visit: Payer: Self-pay

## 2019-03-25 VITALS — Ht 61.5 in | Wt 152.0 lb

## 2019-03-25 DIAGNOSIS — Z800 Family history of malignant neoplasm of digestive organs: Secondary | ICD-10-CM

## 2019-03-25 DIAGNOSIS — Z1159 Encounter for screening for other viral diseases: Secondary | ICD-10-CM

## 2019-03-25 MED ORDER — SUPREP BOWEL PREP KIT 17.5-3.13-1.6 GM/177ML PO SOLN: 1.0000 | Freq: Once | ORAL | 0 refills | Status: AC

## 2019-03-25 NOTE — Progress Notes (Signed)
Patient denies any allergies to egg or soy products. Patient denies complications with anesthesia/sedation.  Patient denies oxygen use at home and denies diet medications. Emmi instructions for colonoscopy/endoscopy explained and given to patient.   

## 2019-03-26 ENCOUNTER — Encounter: Payer: Medicare Other | Admitting: Gastroenterology

## 2019-04-15 ENCOUNTER — Other Ambulatory Visit: Payer: Self-pay | Admitting: Gastroenterology

## 2019-04-15 ENCOUNTER — Ambulatory Visit (INDEPENDENT_AMBULATORY_CARE_PROVIDER_SITE_OTHER): Payer: Medicare Other

## 2019-04-15 DIAGNOSIS — Z1159 Encounter for screening for other viral diseases: Secondary | ICD-10-CM

## 2019-04-16 LAB — SARS CORONAVIRUS 2 (TAT 6-24 HRS): SARS Coronavirus 2: NEGATIVE

## 2019-04-19 ENCOUNTER — Ambulatory Visit (AMBULATORY_SURGERY_CENTER): Payer: Medicare Other | Admitting: Gastroenterology

## 2019-04-19 ENCOUNTER — Other Ambulatory Visit: Payer: Self-pay

## 2019-04-19 ENCOUNTER — Encounter: Payer: Self-pay | Admitting: Gastroenterology

## 2019-04-19 VITALS — BP 113/62 | HR 54 | Temp 98.4°F | Resp 11 | Ht 61.5 in | Wt 152.0 lb

## 2019-04-19 DIAGNOSIS — Z1211 Encounter for screening for malignant neoplasm of colon: Secondary | ICD-10-CM

## 2019-04-19 DIAGNOSIS — I10 Essential (primary) hypertension: Secondary | ICD-10-CM | POA: Diagnosis not present

## 2019-04-19 DIAGNOSIS — Z800 Family history of malignant neoplasm of digestive organs: Secondary | ICD-10-CM

## 2019-04-19 DIAGNOSIS — Z86010 Personal history of colonic polyps: Secondary | ICD-10-CM | POA: Diagnosis not present

## 2019-04-19 MED ORDER — SODIUM CHLORIDE 0.9 % IV SOLN: 500.0000 mL | Freq: Once | INTRAVENOUS | Status: DC

## 2019-04-19 NOTE — Progress Notes (Signed)
Report given to PACU, vss 

## 2019-04-19 NOTE — Op Note (Signed)
Traci [name]: Traci [name] Procedure Date: 04/19/2019 1:12 PM MRN: [id number] Endoscopist: Thornton Park MD, MD Age: 68 Referring MD:  Date of Birth: 04-30-1951 Gender: Female Account #: [id number] Procedure:                Colonoscopy Indications:              Screening in patient at increased risk: Family                            history of 1st-degree relative with colorectal                            cancer before age 72 years                           Colon cancer in patient's father at age 36.                           Prior colonoscopies in 1988, 1993, 1997, 2003,                            2008, and 2015. There is been no history of colon                            polyps. She has had small grade 1 hemorrhoids. Medicines:                Monitored Anesthesia Care Procedure:                Pre-Anesthesia Assessment:                           - Prior to the procedure, a History and Physical                            was performed, and patient medications and                            allergies were reviewed. The patient's tolerance of                            previous anesthesia was also reviewed. The risks                            and benefits of the procedure and the sedation                            options and risks were discussed with the patient.                            All questions were answered, and informed consent                            was obtained. Prior Anticoagulants: The patient has  taken no previous anticoagulant or antiplatelet                            agents. ASA Grade Assessment: II - A patient with                            mild systemic disease. After reviewing the risks                            and benefits, the patient was deemed in                            satisfactory condition to undergo the procedure.                           After obtaining informed consent, the colonoscope                             was passed under direct vision. Throughout the                            procedure, the patient's blood pressure, pulse, and                            oxygen saturations were monitored continuously. The                            Colonoscope was introduced through the anus and                            advanced to the 4 cm into the ileum. A second                            forward view of the right colon was performed. The                            colonoscopy was performed without difficulty. The                            patient tolerated the procedure well. The quality                            of the bowel preparation was excellent. The                            terminal ileum, ileocecal valve, appendiceal                            orifice, and rectum were photographed. Scope In: 1:26:29 PM Scope Out: 1:39:42 PM Scope Withdrawal Time: 0 hours 10 minutes 15 seconds  Total Procedure Duration: 0 hours 13 minutes 13 seconds  Findings:                 Hemorrhoids were found on perianal exam.  Non-bleeding internal hemorrhoids were found.                           The colon (entire examined portion) appeared normal.                           The terminal ileum appeared normal.                           The exam was otherwise without abnormality on                            direct and retroflexion views. Complications:            No immediate complications. Estimated Blood Loss:     Estimated blood loss: none. Impression:               - Hemorrhoids found on perianal exam.                           - Non-bleeding internal hemorrhoids.                           - The entire examined colon is normal.                           - The examined portion of the ileum was normal.                           - The examination was otherwise normal on direct                            and retroflexion views.                           - No  specimens collected. Recommendation:           - Patient has a contact number available for                            emergencies. The signs and symptoms of potential                            delayed complications were discussed with the                            patient. Return to normal activities tomorrow.                            Written discharge instructions were provided to the                            patient.                           - Resume previous diet.                           -  Continue present medications.                           - Repeat colonoscopy in 5-7 years given the family                            history of colon cancer. Thornton Park MD, MD 04/19/2019 1:46:04 PM This report has been signed electronically.

## 2019-04-19 NOTE — Progress Notes (Signed)
Pt's states no medical or surgical changes since previsit or office visit.  Vs by   Temp & covid questions by Keane Scrape

## 2019-04-19 NOTE — Patient Instructions (Signed)
YOU HAD AN ENDOSCOPIC PROCEDURE TODAY AT [facility]:   Refer to the procedure report that was given to you for any specific questions about what was found during the examination.  If the procedure report does not answer your questions, please call your gastroenterologist to clarify.  If you requested that your care partner not be given the details of your procedure findings, then the procedure report has been included in a sealed envelope for you to review at your convenience later.  **Handouts given on hemorrhoids**   YOU SHOULD EXPECT: Some feelings of bloating in the abdomen. Passage of more gas than usual.  Walking can help get rid of the air that was put into your GI tract during the procedure and reduce the bloating. If you had a lower endoscopy (such as a colonoscopy or flexible sigmoidoscopy) you may notice spotting of blood in your stool or on the toilet paper. If you underwent a bowel prep for your procedure, you may not have a normal bowel movement for a few days.  Please Note:  You might notice some irritation and congestion in your nose or some drainage.  This is from the oxygen used during your procedure.  There is no need for concern and it should clear up in a day or so.  SYMPTOMS TO REPORT IMMEDIATELY:   Following lower endoscopy (colonoscopy or flexible sigmoidoscopy):  Excessive amounts of blood in the stool  Significant tenderness or worsening of abdominal pains  Swelling of the abdomen that is new, acute  Fever of 100F or higher   For urgent or emergent issues, a gastroenterologist can be reached at any hour by calling [phone].   DIET:  We do recommend a small meal at first, but then you may proceed to your regular diet.  Drink plenty of fluids but you should avoid alcoholic beverages for 24 hours.  ACTIVITY:  You should plan to take it easy for the rest of today and you should NOT DRIVE or use heavy machinery until tomorrow (because of the  sedation medicines used during the test).    FOLLOW UP: Our staff will call the number listed on your records 48-72 hours following your procedure to check on you and address any questions or concerns that you may have regarding the information given to you following your procedure. If we do not reach you, we will leave a message.  We will attempt to reach you two times.  During this call, we will ask if you have developed any symptoms of COVID 19. If you develop any symptoms (ie: fever, flu-like symptoms, shortness of breath, cough etc.) before then, please call [phone].  If you test positive for Covid 19 in the 2 weeks post procedure, please call and report this information to [place].    If any biopsies were taken you will be contacted by phone or by letter within the next 1-3 weeks.  Please call us at [phone] if you have not heard about the biopsies in 3 weeks.    SIGNATURES/CONFIDENTIALITY: You and/or your care partner have signed paperwork which will be entered into your electronic medical record.  These signatures attest to the fact that that the information above on your After Visit Summary has been reviewed and is understood.  Full responsibility of the confidentiality of this discharge information lies with you and/or your care-partner.

## 2019-04-21 ENCOUNTER — Telehealth: Payer: Self-pay

## 2019-04-21 NOTE — Telephone Encounter (Signed)
  Follow up Call-  Call back number 04/19/2019  Post procedure Call Back phone  # [phone]  Permission to leave phone message Yes  Some recent data might be hidden     Patient questions:  Do you have a fever, pain , or abdominal swelling? No. Pain Score  0 *  Have you tolerated food without any problems? Yes.    Have you been able to return to your normal activities? Yes.    Do you have any questions about your discharge instructions: Diet   No. Medications  No. Follow up visit  No.  Do you have questions or concerns about your Care? No.  Actions: * If pain score is 4 or above: No action needed, pain <4.  1. Have you developed a fever since your procedure? no  2.   Have you had an respiratory symptoms (SOB or cough) since your procedure? no  3.   Have you tested positive for COVID 19 since your procedure no  4.   Have you had any family members/close contacts diagnosed with the COVID 19 since your procedure?  no   If yes to any of these questions please route to Joylene John, RN and Alphonsa Gin, Therapist, sports.

## 2019-05-05 DIAGNOSIS — U071 COVID-19: Secondary | ICD-10-CM | POA: Diagnosis not present

## 2019-05-05 DIAGNOSIS — R05 Cough: Secondary | ICD-10-CM | POA: Diagnosis not present

## 2019-05-06 ENCOUNTER — Encounter

## 2019-05-07 DIAGNOSIS — Z8616 Personal history of COVID-19: Secondary | ICD-10-CM

## 2019-05-07 HISTORY — DX: Personal history of COVID-19: Z86.16

## 2019-05-11 ENCOUNTER — Other Ambulatory Visit: Payer: Self-pay | Admitting: Unknown Physician Specialty

## 2019-05-11 ENCOUNTER — Telehealth: Payer: Self-pay | Admitting: Unknown Physician Specialty

## 2019-05-11 DIAGNOSIS — U071 COVID-19: Secondary | ICD-10-CM

## 2019-05-11 NOTE — Telephone Encounter (Signed)
  I connected by phone with Traci [name] on 05/11/2019 at 4:22 PM to discuss the potential use of an new treatment for mild to moderate COVID-19 viral infection in non-hospitalized patients.  This patient is a 69 y.o. female that meets the FDA criteria for Emergency Use Authorization of bamlanivimab or casirivimab\imdevimab.  Has a (+) direct SARS-CoV-2 viral test result  Has mild or moderate COVID-19   Is ? 71 years of age and weighs ? 40 kg  Is NOT hospitalized due to COVID-19  Is NOT requiring oxygen therapy or requiring an increase in baseline oxygen flow rate due to COVID-19  Is within 10 days of symptom onset  Has at least one of the high risk factor(s) for progression to severe COVID-19 and/or hospitalization as defined in EUA.  Specific high risk criteria : >/= 71 yo   I have spoken and communicated the following to the patient or parent/caregiver:  1. FDA has authorized the emergency use of bamlanivimab and casirivimab\imdevimab for the treatment of mild to moderate COVID-19 in adults and pediatric patients with positive results of direct SARS-CoV-2 viral testing who are 31 years of age and older weighing at least 40 kg, and who are at high risk for progressing to severe COVID-19 and/or hospitalization.  2. The significant known and potential risks and benefits of bamlanivimab and casirivimab\imdevimab, and the extent to which such potential risks and benefits are unknown.  3. Information on available alternative treatments and the risks and benefits of those alternatives, including clinical trials.  4. Patients treated with bamlanivimab and casirivimab\imdevimab should continue to self-isolate and use infection control measures (e.g., wear mask, isolate, social distance, avoid sharing personal items, clean and disinfect "high touch" surfaces, and frequent handwashing) according to CDC guidelines.   5. The patient or parent/caregiver has the option to accept or refuse  bamlanivimab or casirivimab\imdevimab .  After reviewing this information with the patient, The patient agreed to proceed with receiving the bamlanimivab infusion and will be provided a copy of the Fact sheet prior to receiving the infusion.[name] 05/11/2019 4:22 PM  Symptom onset 12/30

## 2019-05-13 ENCOUNTER — Ambulatory Visit (HOSPITAL_COMMUNITY)
Admission: RE | Admit: 2019-05-13 | Discharge: 2019-05-13 | Disposition: A | Discharge status: Home / Self Care | Payer: Medicare Other | Source: Ambulatory Visit | Attending: Pulmonary Disease | Admitting: Pulmonary Disease

## 2019-05-13 ENCOUNTER — Encounter (HOSPITAL_COMMUNITY): Payer: Self-pay

## 2019-05-13 DIAGNOSIS — Z23 Encounter for immunization: Secondary | ICD-10-CM | POA: Insufficient documentation

## 2019-05-13 DIAGNOSIS — U071 COVID-19: Secondary | ICD-10-CM | POA: Insufficient documentation

## 2019-05-13 MED ORDER — EPINEPHRINE 0.3 MG/0.3ML IJ SOAJ: 0.3000 mg | Freq: Once | INTRAMUSCULAR | Status: DC | PRN

## 2019-05-13 MED ORDER — ALBUTEROL SULFATE HFA 108 (90 BASE) MCG/ACT IN AERS: 2.0000 | INHALATION_SPRAY | Freq: Once | RESPIRATORY_TRACT | Status: DC | PRN

## 2019-05-13 MED ORDER — SODIUM CHLORIDE 0.9 % IV SOLN: INTRAVENOUS | Status: DC | PRN

## 2019-05-13 MED ORDER — FAMOTIDINE IN NACL 20-0.9 MG/50ML-% IV SOLN: 20.0000 mg | Freq: Once | INTRAVENOUS | Status: DC | PRN

## 2019-05-13 MED ORDER — SODIUM CHLORIDE 0.9 % IV SOLN
700.0000 mg | Freq: Once | INTRAVENOUS | Status: AC
  Administered 2019-05-13: 18:00:00 700 mg via INTRAVENOUS
  Filled 2019-05-13: qty 20

## 2019-05-13 MED ORDER — DIPHENHYDRAMINE HCL 50 MG/ML IJ SOLN: 50.0000 mg | Freq: Once | INTRAMUSCULAR | Status: DC | PRN

## 2019-05-13 MED ORDER — METHYLPREDNISOLONE SODIUM SUCC 125 MG IJ SOLR: 125.0000 mg | Freq: Once | INTRAMUSCULAR | Status: DC | PRN

## 2019-05-13 NOTE — Discharge Instructions (Signed)

## 2019-05-13 NOTE — Progress Notes (Signed)
Patient ID: Traci [name], female   DOB: 06/10/1950, 70 y.o.   MRN: [id number]  Diagnosis: [id number]  Physician:  Procedure: Cov[facility] Infusion Clinic Med: bamlanivimab infusion - Provided patient with bamlanimivab fact sheet for patients, parents and caregivers prior to infusion.  Complications: No immediate complications noted.  Discharge: Discharged home   Heide Scales 05/13/2019

## 2019-05-13 NOTE — Progress Notes (Signed)
  Diagnosis: COVID-19  Physician: Dr. Brigitte Pulse  Procedure: Covid Infusion Clinic Med: bamlanivimab infusion - Provided patient with bamlanimivab fact sheet for patients, parents and caregivers prior to infusion.  Complications: No immediate complications noted.  Discharge: Discharged home   Stephani Janak N Kaena Santori 05/13/2019

## 2019-05-14 ENCOUNTER — Ambulatory Visit (HOSPITAL_COMMUNITY): Payer: Medicare Other

## 2019-06-07 DIAGNOSIS — M810 Age-related osteoporosis without current pathological fracture: Secondary | ICD-10-CM | POA: Diagnosis not present

## 2019-08-14 ENCOUNTER — Ambulatory Visit: Payer: Medicare Other | Attending: Internal Medicine

## 2019-08-14 DIAGNOSIS — Z23 Encounter for immunization: Secondary | ICD-10-CM

## 2019-08-14 NOTE — Progress Notes (Signed)
   Covid-19 Vaccination Clinic  Name:  Traci [name]    MRN: [id number] DOB: 06-02-1950  08/14/2019  Traci [name] was observed post Covid-19 immunization for 15 minutes without incident. She was provided with Vaccine Information Sheet and instruction to access the V-Safe system.   Traci [name] was instructed to call 911 with any severe reactions post vaccine: [name] Difficulty breathing  . Swelling of face and throat  . A fast heartbeat  . A bad rash all over body  . Dizziness and weakness   Immunizations Administered    Name Date Dose VIS Date Route   Pfizer COVID-19 Vaccine 08/14/2019  8:22 AM 0.3 mL 04/16/2019 Intramuscular   Manufacturer: [place]   Lot: [id number] [id number]   [place]: [id number]

## 2019-08-19 DIAGNOSIS — H16103 Unspecified superficial keratitis, bilateral: Secondary | ICD-10-CM | POA: Diagnosis not present

## 2019-08-19 DIAGNOSIS — H04123 Dry eye syndrome of bilateral lacrimal glands: Secondary | ICD-10-CM | POA: Diagnosis not present

## 2019-08-19 DIAGNOSIS — H25813 Combined forms of age-related cataract, bilateral: Secondary | ICD-10-CM | POA: Diagnosis not present

## 2019-08-19 DIAGNOSIS — H5213 Myopia, bilateral: Secondary | ICD-10-CM | POA: Diagnosis not present

## 2019-09-07 ENCOUNTER — Ambulatory Visit: Payer: Medicare Other | Attending: Internal Medicine

## 2019-09-07 DIAGNOSIS — Z23 Encounter for immunization: Secondary | ICD-10-CM

## 2019-09-07 NOTE — Progress Notes (Signed)
   Covid-19 Vaccination Clinic  Name:  Traci [name]    MRN: [id number] DOB: April 07, 1951  09/07/2019  Ms. [name] was observed post Covid-19 immunization for 15 minutes without incident. She was provided with Vaccine Information Sheet and instruction to access the V-Safe system.   Ms. [name] was instructed to call 911 with any severe reactions post vaccine: [name] Difficulty breathing  . Swelling of face and throat  . A fast heartbeat  . A bad rash all over body  . Dizziness and weakness   Immunizations Administered    Name Date Dose VIS Date Route   Pfizer COVID-19 Vaccine 09/07/2019  8:31 AM 0.3 mL 06/30/2018 Intramuscular   Manufacturer: [place]   Lot: [id number]   [place]: [id number]

## 2019-12-06 DIAGNOSIS — M810 Age-related osteoporosis without current pathological fracture: Secondary | ICD-10-CM | POA: Diagnosis not present

## 2019-12-09 DIAGNOSIS — M810 Age-related osteoporosis without current pathological fracture: Secondary | ICD-10-CM | POA: Diagnosis not present

## 2020-01-11 ENCOUNTER — Telehealth: Payer: Self-pay

## 2020-01-11 NOTE — Telephone Encounter (Signed)
Call to patient. Patient asking if she needs to be seen every year by Dr. Quincy Simmonds? RN advised yearly pelvic exam and breast exam recommended. RN advised could discuss need for pap smear at visit with Dr. Quincy Simmonds. Patient currently scheduled for aex on 07-12-20. RN offered to find earlier aex appointment for patient, but patient declined stating she isn't currently having any problems would like to wait until March.   Routing to provider and will close encounter.

## 2020-01-11 NOTE — Telephone Encounter (Signed)
Patient is calling to speak with nurse about needing an AEX every year.

## 2020-01-12 DIAGNOSIS — R634 Abnormal weight loss: Secondary | ICD-10-CM | POA: Diagnosis not present

## 2020-01-12 DIAGNOSIS — I10 Essential (primary) hypertension: Secondary | ICD-10-CM | POA: Diagnosis not present

## 2020-01-12 DIAGNOSIS — D2271 Melanocytic nevi of right lower limb, including hip: Secondary | ICD-10-CM | POA: Diagnosis not present

## 2020-01-12 DIAGNOSIS — L570 Actinic keratosis: Secondary | ICD-10-CM | POA: Diagnosis not present

## 2020-01-12 DIAGNOSIS — D2372 Other benign neoplasm of skin of left lower limb, including hip: Secondary | ICD-10-CM | POA: Diagnosis not present

## 2020-01-12 DIAGNOSIS — L821 Other seborrheic keratosis: Secondary | ICD-10-CM | POA: Diagnosis not present

## 2020-01-12 DIAGNOSIS — D2261 Melanocytic nevi of right upper limb, including shoulder: Secondary | ICD-10-CM | POA: Diagnosis not present

## 2020-01-12 DIAGNOSIS — D2262 Melanocytic nevi of left upper limb, including shoulder: Secondary | ICD-10-CM | POA: Diagnosis not present

## 2020-01-12 DIAGNOSIS — R432 Parageusia: Secondary | ICD-10-CM | POA: Diagnosis not present

## 2020-01-12 DIAGNOSIS — L814 Other melanin hyperpigmentation: Secondary | ICD-10-CM | POA: Diagnosis not present

## 2020-01-12 DIAGNOSIS — D692 Other nonthrombocytopenic purpura: Secondary | ICD-10-CM | POA: Diagnosis not present

## 2020-01-12 DIAGNOSIS — Z8616 Personal history of COVID-19: Secondary | ICD-10-CM | POA: Diagnosis not present

## 2020-01-12 DIAGNOSIS — Z85828 Personal history of other malignant neoplasm of skin: Secondary | ICD-10-CM | POA: Diagnosis not present

## 2020-01-12 DIAGNOSIS — D2272 Melanocytic nevi of left lower limb, including hip: Secondary | ICD-10-CM | POA: Diagnosis not present

## 2020-02-21 ENCOUNTER — Encounter: Payer: Self-pay | Admitting: Obstetrics and Gynecology

## 2020-02-21 DIAGNOSIS — Z1231 Encounter for screening mammogram for malignant neoplasm of breast: Secondary | ICD-10-CM | POA: Diagnosis not present

## 2020-03-17 DIAGNOSIS — D649 Anemia, unspecified: Secondary | ICD-10-CM | POA: Diagnosis not present

## 2020-03-17 DIAGNOSIS — E785 Hyperlipidemia, unspecified: Secondary | ICD-10-CM | POA: Diagnosis not present

## 2020-03-17 DIAGNOSIS — M810 Age-related osteoporosis without current pathological fracture: Secondary | ICD-10-CM | POA: Diagnosis not present

## 2020-03-20 DIAGNOSIS — R5383 Other fatigue: Secondary | ICD-10-CM | POA: Diagnosis not present

## 2020-03-20 DIAGNOSIS — R634 Abnormal weight loss: Secondary | ICD-10-CM | POA: Diagnosis not present

## 2020-03-20 DIAGNOSIS — D649 Anemia, unspecified: Secondary | ICD-10-CM | POA: Diagnosis not present

## 2020-03-20 DIAGNOSIS — R202 Paresthesia of skin: Secondary | ICD-10-CM | POA: Diagnosis not present

## 2020-03-20 DIAGNOSIS — Z853 Personal history of malignant neoplasm of breast: Secondary | ICD-10-CM | POA: Diagnosis not present

## 2020-03-24 DIAGNOSIS — Z8616 Personal history of COVID-19: Secondary | ICD-10-CM | POA: Diagnosis not present

## 2020-03-24 DIAGNOSIS — M810 Age-related osteoporosis without current pathological fracture: Secondary | ICD-10-CM | POA: Diagnosis not present

## 2020-03-24 DIAGNOSIS — Z853 Personal history of malignant neoplasm of breast: Secondary | ICD-10-CM | POA: Diagnosis not present

## 2020-03-24 DIAGNOSIS — I10 Essential (primary) hypertension: Secondary | ICD-10-CM | POA: Diagnosis not present

## 2020-03-24 DIAGNOSIS — D649 Anemia, unspecified: Secondary | ICD-10-CM | POA: Diagnosis not present

## 2020-03-24 DIAGNOSIS — R7301 Impaired fasting glucose: Secondary | ICD-10-CM | POA: Diagnosis not present

## 2020-03-24 DIAGNOSIS — I422 Other hypertrophic cardiomyopathy: Secondary | ICD-10-CM | POA: Diagnosis not present

## 2020-03-24 DIAGNOSIS — Z1339 Encounter for screening examination for other mental health and behavioral disorders: Secondary | ICD-10-CM | POA: Diagnosis not present

## 2020-03-24 DIAGNOSIS — E785 Hyperlipidemia, unspecified: Secondary | ICD-10-CM | POA: Diagnosis not present

## 2020-03-24 DIAGNOSIS — Z0000 Encounter for general adult medical examination without abnormal findings: Secondary | ICD-10-CM | POA: Diagnosis not present

## 2020-03-24 DIAGNOSIS — N1831 Chronic kidney disease, stage 3a: Secondary | ICD-10-CM | POA: Diagnosis not present

## 2020-03-24 DIAGNOSIS — Z1331 Encounter for screening for depression: Secondary | ICD-10-CM | POA: Diagnosis not present

## 2020-04-18 DIAGNOSIS — H2513 Age-related nuclear cataract, bilateral: Secondary | ICD-10-CM | POA: Diagnosis not present

## 2020-04-18 DIAGNOSIS — H524 Presbyopia: Secondary | ICD-10-CM | POA: Diagnosis not present

## 2020-05-01 DIAGNOSIS — Z20828 Contact with and (suspected) exposure to other viral communicable diseases: Secondary | ICD-10-CM | POA: Diagnosis not present

## 2020-06-06 HISTORY — PX: CATARACT EXTRACTION: SUR2

## 2020-07-10 DIAGNOSIS — M810 Age-related osteoporosis without current pathological fracture: Secondary | ICD-10-CM | POA: Diagnosis not present

## 2020-07-12 ENCOUNTER — Ambulatory Visit: Payer: Medicare Other | Admitting: Obstetrics and Gynecology

## 2020-07-12 DIAGNOSIS — H2511 Age-related nuclear cataract, right eye: Secondary | ICD-10-CM | POA: Diagnosis not present

## 2020-07-12 DIAGNOSIS — H25811 Combined forms of age-related cataract, right eye: Secondary | ICD-10-CM | POA: Diagnosis not present

## 2020-07-18 NOTE — Progress Notes (Unsigned)
GYNECOLOGY  VISIT   HPI: 70 y.o.   Married  Caucasian  female   G2P2002 with Patient's last menstrual period was 05/07/1999 (lmp unknown).   here for breast and pelvic exam.   No vaginal bleeding, discharge, or painful intercourse.   We do not prescribe any medications for patient.  Has lost over 30 lbs since having COVID 15 months ago. Her food choices have changed since Covid. PCP has done lab work to follow her for this.  No nausea, vomiting, abdominal pain, or diarrhea.  Taking Prolia and doing well with this.  Asking about other treatment options for osteoporosis.  Her last BMD was done at at different location.   GYNECOLOGIC HISTORY: Patient's last menstrual period was 05/07/1999 (lmp unknown). Contraception: PMP Menopausal hormone therapy: none Last mammogram: 02/2020 normal per patient:[facility] Last pap smear: 04-16-16 Neg, 03-28-14 Neg:Neg HR  Colonoscopy:  04/2019.  Done in the last year.        OB History    Gravida  2   Para  2   Term  2   Preterm  0   AB  0   Living  2     SAB  0   IAB  0   Ectopic  0   Multiple  0   Live Births  2              Patient Active Problem List   Diagnosis Date Noted  . Other spondylosis with radiculopathy, cervical region 07/14/2018  . Right hip pain 08/19/2016  . Sciatica of left side 08/19/2016  . Basal cell carcinoma of skin 12/16/2012  . Family history of colon cancer 12/16/2012  . ANEMIA, MILD 04/16/2010  . HYPERTROPHIC CARDIOMYOPATHY 01/22/2010  . Palpitations 01/22/2010  . MURMUR 01/10/2009  . SPONDYLOSIS, LUMBAR 01/04/2009  . OSTEOPENIA 01/04/2009  . FASTING HYPERGLYCEMIA 01/04/2009  . HYPERLIPIDEMIA 02/04/2008  . BREAST CANCER, HX OF 02/04/2008  . Essential hypertension 09/18/2006    Past Medical History:  Diagnosis Date  . ASHD (arteriosclerotic heart disease)    Mild septal hypertrophy, 2D ECHO 11/2006, Dr. Percival Spanish  . Breast cancer [facility])    PMH of, Dr. Marylene Buerger, Dr. Truddie Coco  multilpe  sites overlaping, ER +  . Facet syndrome, lumbar    Dr. Lynann Bologna  . Heart murmur    no problems  . History of COVID-19 05/07/2019  . Hyperlipidemia   . Hyperlipidemia   . Hypertension   . Osteopenia   . Skin cancer, basal cell    Dr. Amy Martinique    Past Surgical History:  Procedure Laterality Date  . CATARACT EXTRACTION Right 06/2020  . CHOLECYSTECTOMY  about 1993  . COLONOSCOPY      X 3 negative (last 2008, due 2015), Dr. Olevia Perches  . MASTECTOMY Left 11/2004   Dr. Marylene Buerger, also reduction of right breast and reconstruction on the left.  [name] ROTATOR CUFF REPAIR Left   . TONSILLECTOMY    . TRANSESOPHAGEAL ECHOCARDIOGRAM  2012   Overall left ventricular systolic function was normal. Left ventricular ejection fraction was estimated, range being 55% to 60%. There were no left ventricular regional wall motional abnormalities. Left ventricular wall thickness was mildly increased. There was mild focal basal septal hypertrophy.  . WISDOM TOOTH EXTRACTION      Current Outpatient Medications  Medication Sig Dispense Refill  . amLODipine (NORVASC) 10 MG tablet Take 1 tablet (10 mg total) by mouth daily. 30 tablet 0  . benazepril (LOTENSIN) 40 MG tablet  Take 1 tablet (40 mg total) by mouth daily. 30 tablet 0  . denosumab (PROLIA) 60 MG/ML SOLN injection Inject 60 mg into the skin every 6 (six) months. Administer in upper arm, thigh, or abdomen    . pravastatin (PRAVACHOL) 20 MG tablet TAKE 1 TABLET (20 MG TOTAL) BY MOUTH DAILY. 90 tablet 3   No current facility-administered medications for this visit.     ALLERGIES: Shrimp [shellfish allergy], Hydrocodone, and Vicodin [hydrocodone-acetaminophen]  Family History  Problem Relation Age of Onset  . Hypertension Mother   . Coronary artery disease Mother        no MI  . Lung cancer Mother        smoker  . Colon cancer Father 55  . Coronary artery disease Father        no MI  . Prostate cancer Father   . Diabetes Father   . Hypertension  Sister   . Cancer Maternal Grandfather        GI  . Diabetes Paternal Grandfather   . Stroke Neg Hx   . Esophageal cancer Neg Hx   . Stomach cancer Neg Hx   . Rectal cancer Neg Hx     Social History   Socioeconomic History  . Marital status: Married    Spouse name: Not on file  . Number of children: Not on file  . Years of education: Not on file  . Highest education level: Not on file  Occupational History  . Occupation: Retired    Fish farm manager: UNEMPLOYED  Tobacco Use  . Smoking status: Former Smoker    Packs/day: 1.00    Years: 10.00    Pack years: 10.00    Quit date: 05/06/1978    Years since quitting: 42.2  . Smokeless tobacco: Never Used  . Tobacco comment: smoked [place], up to 1 ppd  Vaping Use  . Vaping Use: Never used  Substance and Sexual Activity  . Alcohol use: Yes    Alcohol/week: 4.0 standard drinks    Types: 4 Glasses of wine per week  . Drug use: No  . Sexual activity: Yes    Partners: Male    Birth control/protection: Post-menopausal  Other Topics Concern  . Not on file  Social History Narrative   Married   Regular exercise: Yes, high level CVE, walks 2 mpd 5x/ week & gym   No specific diet   Social Determinants of Health   Financial Resource Strain: Not on file  Food Insecurity: Not on file  Transportation Needs: Not on file  Physical Activity: Not on file  Stress: Not on file  Social Connections: Not on file  Intimate Partner Violence: Not on file    Review of Systems  Constitutional: Positive for unexpected weight change.    PHYSICAL EXAMINATION:    BP (!) 142/78   Pulse (!) 58   Ht 5' 1.75" (1.568 m)   Wt 115 lb (52.2 kg)   LMP 05/07/1999 (LMP Unknown)   SpO2 98%   BMI 21.20 kg/m     General appearance: alert, cooperative and appears stated age Head: Normocephalic, without obvious abnormality, atraumatic Lungs: clear to auscultation bilaterally Breasts: right - normal appearance, no masses or tenderness, No nipple retraction  or dimpling, No nipple discharge or bleeding, No axillary adenopathy Left breast absent and reconstruction present.  No masses, no axillary adenopathy. Heart: regular rate and rhythm Abdomen: soft, non-tender, no masses,  no organomegaly Extremities: extremities normal, atraumatic, no cyanosis or edema No abnormal  inguinal nodes palpated Neurologic: Grossly normal  Pelvic: External genitalia:  no lesions              Urethra:  normal appearing urethra with no masses, tenderness or lesions              Bartholins and Skenes: normal                 Vagina: normal appearing vagina with normal color and discharge, no lesions              Cervix: no lesions.  Pap taken.                 Bimanual Exam:  Uterus:  normal size, contour, position, consistency, mobility, non-tender              Adnexa: no mass, fullness, tenderness              Rectal exam: Yes.  .  Confirms.              Anus:  normal sphincter tone, no lesions  Chaperone was present for exam.  ASSESSMENT  Hx left breast cancer.  Mastectomy with reconstruction.  Screening breast exam. Screening pelvic exam with normal findings.  Cervical cancer screening.  Osteoporosis.   On Prolia.  Cardiomegaly. Hx Covid infection and weight loss.   PLAN  Pap and reflex HR HPV.  Her PCP is following her bone density and prescribing her Prolia.  We talked about the effectiveness of Prolia to treat osteoporosis.  I reviewed calcium 1200 mg daily in either food sources or supplements in divided doses and vit D 600 - 800 IU daily.  Weight bearing exercise recommended. Fu prn.   An After Visit Summary was printed and given to the patient.  30 min  total time was spent for this patient encounter, including preparation, face-to-face counseling with the patient, coordination of care, and documentation of the encounter.    ______ minutes face to face time of which over 50% was spent in counseling.

## 2020-07-19 ENCOUNTER — Encounter: Payer: Self-pay | Admitting: Obstetrics and Gynecology

## 2020-07-19 ENCOUNTER — Other Ambulatory Visit: Payer: Self-pay

## 2020-07-19 ENCOUNTER — Ambulatory Visit (INDEPENDENT_AMBULATORY_CARE_PROVIDER_SITE_OTHER): Payer: Medicare Other | Admitting: Obstetrics and Gynecology

## 2020-07-19 ENCOUNTER — Other Ambulatory Visit (HOSPITAL_COMMUNITY)
Admission: RE | Admit: 2020-07-19 | Discharge: 2020-07-19 | Disposition: A | Discharge status: Home / Self Care | Payer: Medicare Other | Source: Ambulatory Visit | Attending: Obstetrics and Gynecology | Admitting: Obstetrics and Gynecology

## 2020-07-19 VITALS — BP 142/78 | HR 58 | Ht 61.75 in | Wt 115.0 lb

## 2020-07-19 DIAGNOSIS — Z1239 Encounter for other screening for malignant neoplasm of breast: Secondary | ICD-10-CM | POA: Diagnosis not present

## 2020-07-19 DIAGNOSIS — Z124 Encounter for screening for malignant neoplasm of cervix: Secondary | ICD-10-CM

## 2020-07-19 DIAGNOSIS — Z853 Personal history of malignant neoplasm of breast: Secondary | ICD-10-CM | POA: Diagnosis not present

## 2020-07-19 DIAGNOSIS — M810 Age-related osteoporosis without current pathological fracture: Secondary | ICD-10-CM

## 2020-07-19 DIAGNOSIS — Z01419 Encounter for gynecological examination (general) (routine) without abnormal findings: Secondary | ICD-10-CM | POA: Diagnosis not present

## 2020-07-19 NOTE — Patient Instructions (Signed)

## 2020-07-20 LAB — CYTOLOGY - PAP: Diagnosis: NEGATIVE

## 2020-07-21 ENCOUNTER — Other Ambulatory Visit: Payer: Self-pay | Admitting: Internal Medicine

## 2020-07-21 DIAGNOSIS — Z853 Personal history of malignant neoplasm of breast: Secondary | ICD-10-CM

## 2020-07-21 DIAGNOSIS — R634 Abnormal weight loss: Secondary | ICD-10-CM

## 2020-07-24 DIAGNOSIS — E785 Hyperlipidemia, unspecified: Secondary | ICD-10-CM | POA: Diagnosis not present

## 2020-07-24 DIAGNOSIS — D649 Anemia, unspecified: Secondary | ICD-10-CM | POA: Diagnosis not present

## 2020-07-31 ENCOUNTER — Ambulatory Visit (HOSPITAL_COMMUNITY)
Admission: RE | Admit: 2020-07-31 | Discharge: 2020-07-31 | Disposition: A | Discharge status: Home / Self Care | Payer: Medicare Other | Source: Ambulatory Visit | Attending: Internal Medicine | Admitting: Internal Medicine

## 2020-07-31 ENCOUNTER — Other Ambulatory Visit: Payer: Self-pay

## 2020-07-31 DIAGNOSIS — R634 Abnormal weight loss: Secondary | ICD-10-CM | POA: Diagnosis not present

## 2020-07-31 DIAGNOSIS — Z853 Personal history of malignant neoplasm of breast: Secondary | ICD-10-CM

## 2020-07-31 DIAGNOSIS — K5730 Diverticulosis of large intestine without perforation or abscess without bleeding: Secondary | ICD-10-CM | POA: Diagnosis not present

## 2020-07-31 LAB — POCT I-STAT CREATININE: Creatinine, Ser: 1.2 mg/dL — ABNORMAL HIGH (ref 0.44–1.00)

## 2020-07-31 MED ORDER — IOHEXOL 300 MG/ML  SOLN
75.0000 mL | Freq: Once | INTRAMUSCULAR | Status: AC | PRN
  Administered 2020-07-31: 75 mL via INTRAVENOUS

## 2020-08-03 DIAGNOSIS — Z1212 Encounter for screening for malignant neoplasm of rectum: Secondary | ICD-10-CM | POA: Diagnosis not present

## 2020-08-28 DIAGNOSIS — Z23 Encounter for immunization: Secondary | ICD-10-CM | POA: Diagnosis not present

## 2020-10-05 DIAGNOSIS — M25561 Pain in right knee: Secondary | ICD-10-CM | POA: Diagnosis not present

## 2020-10-05 DIAGNOSIS — M25511 Pain in right shoulder: Secondary | ICD-10-CM | POA: Diagnosis not present

## 2020-10-05 DIAGNOSIS — M1711 Unilateral primary osteoarthritis, right knee: Secondary | ICD-10-CM | POA: Diagnosis not present

## 2020-10-05 DIAGNOSIS — M19011 Primary osteoarthritis, right shoulder: Secondary | ICD-10-CM | POA: Diagnosis not present

## 2021-01-01 DIAGNOSIS — M810 Age-related osteoporosis without current pathological fracture: Secondary | ICD-10-CM | POA: Diagnosis not present

## 2021-01-11 DIAGNOSIS — D0461 Carcinoma in situ of skin of right upper limb, including shoulder: Secondary | ICD-10-CM | POA: Diagnosis not present

## 2021-01-11 DIAGNOSIS — D225 Melanocytic nevi of trunk: Secondary | ICD-10-CM | POA: Diagnosis not present

## 2021-01-11 DIAGNOSIS — L570 Actinic keratosis: Secondary | ICD-10-CM | POA: Diagnosis not present

## 2021-01-11 DIAGNOSIS — C44519 Basal cell carcinoma of skin of other part of trunk: Secondary | ICD-10-CM | POA: Diagnosis not present

## 2021-01-11 DIAGNOSIS — L720 Epidermal cyst: Secondary | ICD-10-CM | POA: Diagnosis not present

## 2021-01-11 DIAGNOSIS — M810 Age-related osteoporosis without current pathological fracture: Secondary | ICD-10-CM | POA: Diagnosis not present

## 2021-01-11 DIAGNOSIS — Z85828 Personal history of other malignant neoplasm of skin: Secondary | ICD-10-CM | POA: Diagnosis not present

## 2021-01-11 DIAGNOSIS — D485 Neoplasm of uncertain behavior of skin: Secondary | ICD-10-CM | POA: Diagnosis not present

## 2021-01-16 DIAGNOSIS — R803 Bence Jones proteinuria: Secondary | ICD-10-CM | POA: Diagnosis not present

## 2021-01-16 DIAGNOSIS — I10 Essential (primary) hypertension: Secondary | ICD-10-CM | POA: Diagnosis not present

## 2021-01-16 DIAGNOSIS — N189 Chronic kidney disease, unspecified: Secondary | ICD-10-CM | POA: Diagnosis not present

## 2021-01-24 ENCOUNTER — Other Ambulatory Visit: Payer: Self-pay | Admitting: Internal Medicine

## 2021-01-24 ENCOUNTER — Other Ambulatory Visit (HOSPITAL_COMMUNITY): Payer: Self-pay | Admitting: Internal Medicine

## 2021-01-24 DIAGNOSIS — R803 Bence Jones proteinuria: Secondary | ICD-10-CM

## 2021-01-25 ENCOUNTER — Telehealth: Payer: Self-pay | Admitting: Hematology

## 2021-01-25 DIAGNOSIS — R803 Bence Jones proteinuria: Secondary | ICD-10-CM | POA: Diagnosis not present

## 2021-01-25 DIAGNOSIS — E785 Hyperlipidemia, unspecified: Secondary | ICD-10-CM | POA: Diagnosis not present

## 2021-01-25 NOTE — Telephone Encounter (Signed)
Scheduled appt per 9/21 referral. Pt is aware of appt date and time.  

## 2021-01-29 ENCOUNTER — Ambulatory Visit
Admission: RE | Admit: 2021-01-29 | Discharge: 2021-01-29 | Disposition: A | Discharge status: Home / Self Care | Payer: Medicare Other | Source: Ambulatory Visit | Attending: Internal Medicine | Admitting: Internal Medicine

## 2021-01-29 DIAGNOSIS — R803 Bence Jones proteinuria: Secondary | ICD-10-CM

## 2021-01-30 ENCOUNTER — Inpatient Hospital Stay: Payer: Medicare Other | Attending: Hematology | Admitting: Hematology

## 2021-01-30 ENCOUNTER — Other Ambulatory Visit: Payer: Self-pay

## 2021-01-30 ENCOUNTER — Inpatient Hospital Stay: Payer: Medicare Other

## 2021-01-30 VITALS — BP 165/75 | HR 65 | Temp 98.0°F | Resp 16 | Ht 61.75 in | Wt 119.4 lb

## 2021-01-30 DIAGNOSIS — N189 Chronic kidney disease, unspecified: Secondary | ICD-10-CM | POA: Diagnosis not present

## 2021-01-30 DIAGNOSIS — Z833 Family history of diabetes mellitus: Secondary | ICD-10-CM | POA: Diagnosis not present

## 2021-01-30 DIAGNOSIS — Z87891 Personal history of nicotine dependence: Secondary | ICD-10-CM | POA: Diagnosis not present

## 2021-01-30 DIAGNOSIS — Z800 Family history of malignant neoplasm of digestive organs: Secondary | ICD-10-CM | POA: Diagnosis not present

## 2021-01-30 DIAGNOSIS — D649 Anemia, unspecified: Secondary | ICD-10-CM | POA: Diagnosis not present

## 2021-01-30 DIAGNOSIS — Z8616 Personal history of COVID-19: Secondary | ICD-10-CM | POA: Diagnosis not present

## 2021-01-30 DIAGNOSIS — Z801 Family history of malignant neoplasm of trachea, bronchus and lung: Secondary | ICD-10-CM | POA: Insufficient documentation

## 2021-01-30 DIAGNOSIS — R803 Bence Jones proteinuria: Secondary | ICD-10-CM

## 2021-01-30 DIAGNOSIS — Z853 Personal history of malignant neoplasm of breast: Secondary | ICD-10-CM | POA: Insufficient documentation

## 2021-01-30 DIAGNOSIS — Z8042 Family history of malignant neoplasm of prostate: Secondary | ICD-10-CM | POA: Diagnosis not present

## 2021-01-30 DIAGNOSIS — Z85828 Personal history of other malignant neoplasm of skin: Secondary | ICD-10-CM | POA: Diagnosis not present

## 2021-01-30 DIAGNOSIS — Z7289 Other problems related to lifestyle: Secondary | ICD-10-CM | POA: Insufficient documentation

## 2021-01-30 DIAGNOSIS — Z8249 Family history of ischemic heart disease and other diseases of the circulatory system: Secondary | ICD-10-CM | POA: Insufficient documentation

## 2021-01-30 LAB — CBC WITH DIFFERENTIAL/PLATELET
Abs Immature Granulocytes: 0.01 10*3/uL (ref 0.00–0.07)
Basophils Absolute: 0.1 10*3/uL (ref 0.0–0.1)
Basophils Relative: 1 %
Eosinophils Absolute: 0.1 10*3/uL (ref 0.0–0.5)
Eosinophils Relative: 1 %
HCT: 34.6 % — ABNORMAL LOW (ref 36.0–46.0)
Hemoglobin: 11.4 g/dL — ABNORMAL LOW (ref 12.0–15.0)
Immature Granulocytes: 0 %
Lymphocytes Relative: 28 %
Lymphs Abs: 1.6 10*3/uL (ref 0.7–4.0)
MCH: 30.8 pg (ref 26.0–34.0)
MCHC: 32.9 g/dL (ref 30.0–36.0)
MCV: 93.5 fL (ref 80.0–100.0)
Monocytes Absolute: 0.4 10*3/uL (ref 0.1–1.0)
Monocytes Relative: 6 %
Neutro Abs: 3.5 10*3/uL (ref 1.7–7.7)
Neutrophils Relative %: 64 %
Platelets: 214 10*3/uL (ref 150–400)
RBC: 3.7 MIL/uL — ABNORMAL LOW (ref 3.87–5.11)
RDW: 13.1 % (ref 11.5–15.5)
WBC: 5.5 10*3/uL (ref 4.0–10.5)
nRBC: 0 % (ref 0.0–0.2)

## 2021-01-30 LAB — CMP (CANCER CENTER ONLY)
ALT: 20 U/L (ref 0–44)
AST: 30 U/L (ref 15–41)
Albumin: 4.6 g/dL (ref 3.5–5.0)
Alkaline Phosphatase: 42 U/L (ref 38–126)
Anion gap: 12 (ref 5–15)
BUN: 29 mg/dL — ABNORMAL HIGH (ref 8–23)
CO2: 23 mmol/L (ref 22–32)
Calcium: 9.7 mg/dL (ref 8.9–10.3)
Chloride: 107 mmol/L (ref 98–111)
Creatinine: 1.19 mg/dL — ABNORMAL HIGH (ref 0.44–1.00)
GFR, Estimated: 49 mL/min — ABNORMAL LOW (ref >60)
Glucose, Bld: 94 mg/dL (ref 70–99)
Potassium: 3.7 mmol/L (ref 3.5–5.1)
Sodium: 142 mmol/L (ref 135–145)
Total Bilirubin: 0.5 mg/dL (ref 0.3–1.2)
Total Protein: 7.5 g/dL (ref 6.5–8.1)

## 2021-01-30 LAB — LACTATE DEHYDROGENASE: LDH: 225 U/L — ABNORMAL HIGH (ref 98–192)

## 2021-01-30 LAB — FERRITIN: Ferritin: 22 ng/mL (ref 11–307)

## 2021-01-30 LAB — IRON AND TIBC
Iron: 65 ug/dL (ref 41–142)
Saturation Ratios: 14 % — ABNORMAL LOW (ref 21–57)
TIBC: 461 ug/dL — ABNORMAL HIGH (ref 236–444)
UIBC: 396 ug/dL — ABNORMAL HIGH (ref 120–384)

## 2021-01-30 LAB — SEDIMENTATION RATE: Sed Rate: 12 mm/hr (ref 0–22)

## 2021-01-30 NOTE — Patient Instructions (Signed)
Thank you for choosing [facility] to provide your care.   Should you have questions after your visit to the [facility] ([facility]), please contact this office at [phone] between 8:30 AM and 4:30 PM.  Voice mails left after 4:00 PM may not be returned until the following business day.  Calls received after 4:30 PM will be answered by an off-site Nurse Triage Line.    Prescription Refills:  Please have your pharmacy contact us directly for most prescription requests.  Contact the office directly for refills of narcotics (pain medications). Allow 48-72 hours for refills.  Appointments: Please contact the [facility] scheduling department [phone] for questions regarding [facility] appointment scheduling.  Contact the schedulers with any scheduling changes so that your appointment can be rescheduled in a timely manner.   Central Scheduling for [place] [facility] [phone] - Call to schedule procedures such as PET scans, CT scans, MRI, Ultrasound, etc.  To afford each patient quality time with our providers, please arrive 30 minutes before your scheduled appointment time.  If you arrive late for your appointment, you may be asked to reschedule.  We strive to give you quality time with our providers, and arriving late affects you and other patients whose appointments are after yours. If you are a no show for multiple scheduled visits, you may be dismissed from the clinic at the providers discretion.     Resources: [facility] Social Workers [phone] for additional information on assistance programs or assistance connecting with community support programs   [place] DSS  [phone]: Information regarding food stamps, Medicaid, and utility assistance [place] [phone]   [place] [employer]'s shared-ride transportation service for eligible riders who have a disability that prevents them from riding the fixed route bus.   Medicare Rights Center 800-333-4114  Helps people with Medicare understand their rights and benefits, navigate the Medicare system, and secure the quality healthcare they deserve [employer] 800-227-2345 Assists patients locate various types of support and financial assistance Cancer Care: 1-800-813-HOPE (4673) Provides financial assistance, online support groups, medication/co-pay assistance.   Transportation Assistance for appointments at [facility]: Transportation Coordinator [phone]  Again, thank you for choosing [facility] for your care.       

## 2021-01-30 NOTE — Progress Notes (Signed)
[name]   HEMATOLOGY/ONCOLOGY CONSULTATION NOTE  Date of Service: 01/30/2021  Patient Care Team: Ginger Organ., MD as PCP - General (Internal Medicine)  CHIEF COMPLAINTS/PURPOSE OF CONSULTATION:  Traci [name] [name] proteinuria  HISTORY OF PRESENTING ILLNESS:   Traci [name] [name] Traci [name] a wonderful 70 y.o. [name] who has been referred to [place] by Dr .Brigitte Pulse, Emily Filbert., MD  for evaluation and management of Bence Jones Proteinuria.  Patient had work-up for mild anemia and progressive chronic kidney disease and was noted to have Bence-Jones proteinuria on a random urine sample.  This iwas not quantifiable.  Patient notes she has lost about 30 pounds after having COVID-19 infection in January 2021 but has since gained back some of that weight and Traci [name] eating better. Meuth no focal bone pains , no new hypercalcemia.  Stable anemia.  Recent bone survey that did not show any obvious metastatic lesions..  We discussed the importance and diagnostic possibilities in the context of Bence-Jones proteinuria including the possibility of multiple myeloma or lymphomas.   MEDICAL HISTORY:  Past Medical History:  Diagnosis Date   ASHD (arteriosclerotic heart disease)    Mild septal hypertrophy, 2D ECHO 11/2006, Dr. Percival Spanish   Breast cancer [facility])    PMH of, Dr. Marylene Buerger, Dr. Truddie Coco  multilpe sites overlaping, ER +   Facet syndrome, lumbar    Dr. Lynann Bologna   Heart murmur    no problems   History of COVID-19 05/07/2019   Hyperlipidemia    Hyperlipidemia    Hypertension    Osteopenia    Skin cancer, basal cell    Dr. Amy Martinique     SURGICAL HISTORY: Past Surgical History:  Procedure Laterality Date   CATARACT EXTRACTION Right 06/2020   CHOLECYSTECTOMY  about 1993   COLONOSCOPY      X 3 negative (last 2008, due 2015), Dr. Olevia Perches   MASTECTOMY Left 11/2004   Dr. Marylene Buerger, also reduction of right breast and reconstruction on the left.   ROTATOR CUFF REPAIR Left    TONSILLECTOMY     TRANSESOPHAGEAL  ECHOCARDIOGRAM  2012   Overall left ventricular systolic function was normal. Left ventricular ejection fraction was estimated, range being 55% to 60%. There were no left ventricular regional wall motional abnormalities. Left ventricular wall thickness was mildly increased. There was mild focal basal septal hypertrophy.   WISDOM TOOTH EXTRACTION      SOCIAL HISTORY: Social History   Socioeconomic History   Marital status: Married    Spouse name: Not on file   Number of children: Not on file   Years of education: Not on file   Highest education level: Not on file  Occupational History   Occupation: Retired    Fish farm manager: UNEMPLOYED  Tobacco Use   Smoking status: Former    Packs/day: 1.00    Years: 10.00    Pack years: 10.00    Types: Cigarettes    Quit date: 05/06/1978    Years since quitting: 42.7   Smokeless tobacco: Never   Tobacco comments:    smoked [place], up to 1 ppd  Vaping Use   Vaping Use: Never used  Substance and Sexual Activity   Alcohol use: Yes    Alcohol/week: 4.0 standard drinks    Types: 4 Glasses of wine per week   Drug use: No   Sexual activity: Yes    Partners: Male    Birth control/protection: Post-menopausal  Other Topics Concern   Not on file  Social History  Narrative   Married   Regular exercise: Yes, high level CVE, walks 2 mpd 5x/ week & gym   No specific diet   Social Determinants of Health   Financial Resource Strain: Not on file  Food Insecurity: Not on file  Transportation Needs: Not on file  Physical Activity: Not on file  Stress: Not on file  Social Connections: Not on file  Intimate Partner Violence: Not on file    FAMILY HISTORY: Family History  Problem Relation Age of Onset   Hypertension Mother    Coronary artery disease Mother        no MI   Lung cancer Mother        smoker   Colon cancer Father 30   Coronary artery disease Father        no MI   Prostate cancer Father    Diabetes Father    Hypertension Sister     Cancer Maternal Grandfather        GI   Diabetes Paternal Grandfather    Stroke Neg Hx    Esophageal cancer Neg Hx    Stomach cancer Neg Hx    Rectal cancer Neg Hx     ALLERGIES:  Traci [name] allergic to shrimp [shellfish allergy], hydrocodone, and vicodin [hydrocodone-acetaminophen].  MEDICATIONS:  Current Outpatient Medications  Medication Sig Dispense Refill   amLODipine (NORVASC) 10 MG tablet Take 1 tablet (10 mg total) by mouth daily. 30 tablet 0   benazepril (LOTENSIN) 40 MG tablet Take 1 tablet (40 mg total) by mouth daily. 30 tablet 0   denosumab (PROLIA) 60 MG/ML SOLN injection Inject 60 mg into the skin every 6 (six) months. Administer in upper arm, thigh, or abdomen     pravastatin (PRAVACHOL) 20 MG tablet TAKE 1 TABLET (20 MG TOTAL) BY MOUTH DAILY. 90 tablet 3   No current facility-administered medications for this visit.    REVIEW OF SYSTEMS:    10 Point review of Systems was done Traci [name] negative except as noted above.  PHYSICAL EXAMINATION: ECOG PERFORMANCE STATUS: 1 - Symptomatic but completely ambulatory  . Vitals:   01/30/21 1111  BP: (!) 165/75  Pulse: 65  Resp: 16  Temp: 98 F (36.7 C)  SpO2: 100%   Filed Weights   01/30/21 1111  Weight: 119 lb 6.4 oz (54.2 kg)   .Body mass index Traci [name] 22.02 kg/m.  GENERAL:alert, in no acute distress and comfortable SKIN: no acute rashes, no significant lesions EYES: conjunctiva are pink and non-injected, sclera anicteric OROPHARYNX: MMM, no exudates, no oropharyngeal erythema or ulceration NECK: supple, no JVD LYMPH:  no palpable lymphadenopathy in the cervical, axillary or inguinal regions LUNGS: clear to auscultation b/l with normal respiratory effort HEART: regular rate & rhythm ABDOMEN:  normoactive bowel sounds , non tender, not distended. Extremity: no pedal edema PSYCH: alert & oriented x 3 with fluent speech NEURO: no focal motor/sensory deficits  LABORATORY DATA:  I have reviewed the data as listed  . CBC  Latest Ref Rng & Units 01/30/2021 03/14/2014 01/05/2013  WBC 4.0 - 10.5 K/uL 5.5 4.5 5.0  Hemoglobin 12.0 - 15.0 g/dL 11.4(L) 12.1 12.6  Hematocrit 36.0 - 46.0 % 34.6(L) 37.2 36.8  Platelets 150 - 400 K/uL 214 247.0 224.0    . CMP Latest Ref Rng & Units 01/30/2021 07/31/2020 03/23/2018  Glucose 70 - 99 mg/dL 94 - -  BUN 8 - 23 mg/dL 29(H) - -  Creatinine 0.44 - 1.00 mg/dL 1.19(H) 1.20(H) 0.65  Sodium 135 -  145 mmol/L 142 - -  Potassium 3.5 - 5.1 mmol/L 3.7 - -  Chloride 98 - 111 mmol/L 107 - -  CO2 22 - 32 mmol/L 23 - -  Calcium 8.9 - 10.3 mg/dL 9.7 - -  Total Protein 6.5 - 8.1 g/dL 7.5 - -  Total Bilirubin 0.3 - 1.2 mg/dL 0.5 - -  Alkaline Phos 38 - 126 U/L 42 - -  AST 15 - 41 U/L 30 - -  ALT 0 - 44 U/L 20 - -     RADIOGRAPHIC STUDIES: I have personally reviewed the radiological images as listed and agreed with the findings in the report. DG Bone Survey Met  Result Date: 01/29/2021 CLINICAL DATA:  Bence-Jones proteinuria EXAM: METASTATIC BONE SURVEY COMPARISON:  Chest abdomen pelvis CT 07/31/2020. FINDINGS: No evidence of lytic lesions throughout the axial or appendicular skeleton. 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 Subchondral cysts appear typically degenerative rather than representing myelomatous lesions. Diffuse degenerative change throughout the cervical, thoracic, and lumbar spine. No vertebral body compression. Stable anterolisthesis of L4 on L5 from prior CT. IMPRESSION: 1. No evidence of myelomatous lesion. 2. 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 Subchondral cysts appear typically degenerative rather than representing myelomatous lesions. 3. Diffuse degenerative change throughout the cervical, thoracic, and lumbar spine. Stable anterolisthesis of L4 on L5. Electronically Signed   By: Keith Rake M.D.   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  History of hypertension, dyslipidemia  Plan -Discussed available results with the patient and discussed the significance of Bence-Jones proteinuria and the possible etiologies. -We will evaluate the patient for multiple myeloma. -Lab work-up for myeloma ordered as noted below -Follow-up plan .  Labs today phone visit with Dr. Irene Limbo in 1 week.   Orders Placed This Encounter  Procedures   CBC with Differential/Platelet    Standing Status:   Future    Number of Occurrences:   1    Standing Expiration Date:   01/30/2022   CMP ([place] only)    Standing Status:   Future    Number of Occurrences:   1    Standing Expiration Date:   01/30/2022   Lactate dehydrogenase    Standing Status:   Future    Number of Occurrences:   1    Standing Expiration Date:   01/30/2022   Beta 2 microglobulin    Standing Status:   Future    Number of Occurrences:   1    Standing Expiration Date:   01/30/2022   Multiple Myeloma Panel (SPEP&IFE w/QIG)    Standing Status:   Future    Number of Occurrences:   1    Standing Expiration Date:   01/30/2022   Kappa/lambda light chains    Standing Status:   Future    Number of Occurrences:   1    Standing Expiration Date:   01/30/2022   Sedimentation rate    Standing Status:   Future    Number of Occurrences:   1    Standing Expiration Date:   01/30/2022   Ferritin  Standing Status:   Future    Number of Occurrences:   1    Standing Expiration Date:   01/30/2022   Iron and TIBC    Standing Status:   Future    Number of Occurrences:   1    Standing Expiration Date:   01/30/2022      All of the patients questions were answered with apparent satisfaction. The patient knows to call the clinic with any problems, questions or concerns.  I spent 40 minutes counseling the patient face to face. The total time spent in the appointment was 60 minutes and more than 50% was on counseling and direct patient cares.    Sullivan Lone MD [place] AAHIVMS [facility] Hematology/Oncology Physician [facility]  (Office):       [phone] (Work cell):  [phone] (Fax):           [phone]  01/30/2021 11:12 AM

## 2021-01-31 LAB — BETA 2 MICROGLOBULIN, SERUM: Beta-2 Microglobulin: 1.7 mg/L (ref 0.6–2.4)

## 2021-01-31 LAB — KAPPA/LAMBDA LIGHT CHAINS
Kappa free light chain: 11.8 mg/L (ref 3.3–19.4)
Kappa, lambda light chain ratio: 0.02 — ABNORMAL LOW (ref 0.26–1.65)
Lambda free light chains: 480.8 mg/L — ABNORMAL HIGH (ref 5.7–26.3)

## 2021-02-01 ENCOUNTER — Other Ambulatory Visit: Payer: Self-pay | Admitting: *Deleted

## 2021-02-01 DIAGNOSIS — R803 Bence Jones proteinuria: Secondary | ICD-10-CM | POA: Diagnosis not present

## 2021-02-01 DIAGNOSIS — Z853 Personal history of malignant neoplasm of breast: Secondary | ICD-10-CM | POA: Diagnosis not present

## 2021-02-01 DIAGNOSIS — Z7289 Other problems related to lifestyle: Secondary | ICD-10-CM | POA: Diagnosis not present

## 2021-02-01 DIAGNOSIS — N189 Chronic kidney disease, unspecified: Secondary | ICD-10-CM | POA: Diagnosis not present

## 2021-02-01 DIAGNOSIS — D649 Anemia, unspecified: Secondary | ICD-10-CM | POA: Diagnosis not present

## 2021-02-01 DIAGNOSIS — Z8616 Personal history of COVID-19: Secondary | ICD-10-CM | POA: Diagnosis not present

## 2021-02-01 LAB — MULTIPLE MYELOMA PANEL, SERUM
Albumin SerPl Elph-Mcnc: 4.3 g/dL (ref 2.9–4.4)
Albumin/Glob SerPl: 1.8 — ABNORMAL HIGH (ref 0.7–1.7)
Alpha 1: 0.2 g/dL (ref 0.0–0.4)
Alpha2 Glob SerPl Elph-Mcnc: 0.7 g/dL (ref 0.4–1.0)
B-Globulin SerPl Elph-Mcnc: 1 g/dL (ref 0.7–1.3)
Gamma Glob SerPl Elph-Mcnc: 0.6 g/dL (ref 0.4–1.8)
Globulin, Total: 2.5 g/dL (ref 2.2–3.9)
IgA: 91 mg/dL (ref 64–422)
IgG (Immunoglobin G), Serum: 610 mg/dL (ref 586–1602)
IgM (Immunoglobulin M), Srm: 79 mg/dL (ref 26–217)
Total Protein ELP: 6.8 g/dL (ref 6.0–8.5)

## 2021-02-05 LAB — UPEP/UIFE/LIGHT CHAINS/TP, 24-HR UR
% BETA, Urine: 27 %
ALPHA 1 URINE: 2.5 %
Albumin, U: 29.1 %
Alpha 2, Urine: 6.2 %
Free Kappa Lt Chains,Ur: 31.61 mg/L (ref 1.17–86.46)
Free Kappa/Lambda Ratio: 0.22 — ABNORMAL LOW (ref 1.83–14.26)
Free Lambda Lt Chains,Ur: 144.52 mg/L — ABNORMAL HIGH (ref 0.27–15.21)
GAMMA GLOBULIN URINE: 35.2 %
M-SPIKE %, Urine: 19.2 % — ABNORMAL HIGH
M-Spike, Mg/24 Hr: 52 mg/24 hr — ABNORMAL HIGH
Total Protein, Urine-Ur/day: 271 mg/24 hr — ABNORMAL HIGH (ref 30–150)
Total Protein, Urine: 12.2 mg/dL
Total Volume: 2225

## 2021-02-06 ENCOUNTER — Other Ambulatory Visit: Payer: Self-pay

## 2021-02-06 ENCOUNTER — Inpatient Hospital Stay: Payer: Medicare Other | Attending: Hematology | Admitting: Hematology

## 2021-02-06 DIAGNOSIS — Z833 Family history of diabetes mellitus: Secondary | ICD-10-CM | POA: Insufficient documentation

## 2021-02-06 DIAGNOSIS — Z8249 Family history of ischemic heart disease and other diseases of the circulatory system: Secondary | ICD-10-CM | POA: Diagnosis not present

## 2021-02-06 DIAGNOSIS — Z87891 Personal history of nicotine dependence: Secondary | ICD-10-CM | POA: Insufficient documentation

## 2021-02-06 DIAGNOSIS — E785 Hyperlipidemia, unspecified: Secondary | ICD-10-CM | POA: Diagnosis not present

## 2021-02-06 DIAGNOSIS — D631 Anemia in chronic kidney disease: Secondary | ICD-10-CM | POA: Insufficient documentation

## 2021-02-06 DIAGNOSIS — N189 Chronic kidney disease, unspecified: Secondary | ICD-10-CM | POA: Diagnosis not present

## 2021-02-06 DIAGNOSIS — E8809 Other disorders of plasma-protein metabolism, not elsewhere classified: Secondary | ICD-10-CM | POA: Diagnosis not present

## 2021-02-06 DIAGNOSIS — Z79899 Other long term (current) drug therapy: Secondary | ICD-10-CM | POA: Insufficient documentation

## 2021-02-06 DIAGNOSIS — R803 Bence Jones proteinuria: Secondary | ICD-10-CM | POA: Insufficient documentation

## 2021-02-06 DIAGNOSIS — Z801 Family history of malignant neoplasm of trachea, bronchus and lung: Secondary | ICD-10-CM | POA: Diagnosis not present

## 2021-02-06 DIAGNOSIS — Z800 Family history of malignant neoplasm of digestive organs: Secondary | ICD-10-CM | POA: Diagnosis not present

## 2021-02-07 ENCOUNTER — Telehealth: Payer: Self-pay | Admitting: Hematology

## 2021-02-07 NOTE — Telephone Encounter (Signed)
Left message with follow-up appointment per 10/4 los. 

## 2021-02-09 ENCOUNTER — Other Ambulatory Visit: Payer: Self-pay

## 2021-02-09 DIAGNOSIS — R803 Bence Jones proteinuria: Secondary | ICD-10-CM

## 2021-02-12 MED ORDER — POLYSACCHARIDE IRON COMPLEX 150 MG PO CAPS: 150.0000 mg | ORAL_CAPSULE | Freq: Every day | ORAL | 2 refills | Status: DC

## 2021-02-12 NOTE — Progress Notes (Signed)
[name]   HEMATOLOGY/ONCOLOGY CONSULTATION NOTE  Date of Service: .02/06/2021   Patient Care Team: Ginger Organ., MD as PCP - General (Internal Medicine)  CHIEF COMPLAINTS/PURPOSE OF CONSULTATION:  R/o Myeloma  HISTORY OF PRESENTING ILLNESS:   Traci [name] is a wonderful 70 y.o. 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  Patient had work-up for mild anemia and progressive chronic kidney disease and was noted to have Bence-Jones proteinuria on a random urine sample.  This iwas not quantifiable.  Patient notes she has lost about 30 pounds after having COVID-19 infection in January 2021 but has since gained back some of that weight and is eating better. Meuth no focal bone pains , no new hypercalcemia.  Stable anemia.  Recent bone survey that did not show any obvious metastatic lesions..  We discussed the importance and diagnostic possibilities in the context of Bence-Jones proteinuria including the possibility of multiple myeloma or lymphomas.  INTERVAL HISTORY  .I connected with Deon Pilling on .02/06/2021 at 10:00 AM EDT by telephone visit and verified that I am speaking with the correct person using two identifiers.   I discussed the limitations, risks, security and privacy concerns of performing an evaluation and management service by telemedicine and the availability of in-person appointments. I also discussed with the patient that there may be a patient responsible charge related to this service. The patient expressed understanding and agreed to proceed.   Other persons participating in the visit and their role in the encounter: Patient's husband  Patient's location: Home Provider's location: [place]  Chief Complaint: Follow-up to discuss initial work-up for possible myeloma.  Patient was called to discuss her lab results for work-up done for possible myeloma. She notes  no new acute symptoms.  No focal bone pains. CBC showed hemoglobin of 11.4 with an MCV of 93.5 WBC count of 5.5k and platelets of 214k CMP shows creatinine of 1.19, normal calcium level of 9.7 LDH 225 Microglobulin level of 1.7 Myeloma panel showed no evidence of monoclonal protein with no M spike Serum free light chains showed significantly elevated lambda free light chains to 481 with normal serum free kappa light chains with a kappa lambda light chain ratio of 0.02.  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   MEDICAL HISTORY:  Past Medical History:  Diagnosis Date   ASHD (arteriosclerotic heart disease)    Mild septal hypertrophy, 2D ECHO 11/2006, Dr. Percival Spanish   Breast cancer [facility])    PMH of, Dr. Marylene Buerger, Dr. Truddie Coco  multilpe sites overlaping, ER +   Facet syndrome, lumbar    Dr. Lynann Bologna   Heart murmur    no problems   History of COVID-19 05/07/2019   Hyperlipidemia    Hyperlipidemia    Hypertension    Osteopenia    Skin cancer, basal cell    Dr. Amy Martinique     SURGICAL HISTORY: Past Surgical History:  Procedure Laterality Date   CATARACT EXTRACTION Right 06/2020   CHOLECYSTECTOMY  about 1993   COLONOSCOPY      X 3 negative (last 2008, due 2015), Dr. Olevia Perches   MASTECTOMY Left 11/2004   Dr. Marylene Buerger, also reduction of right breast and reconstruction on the left.   ROTATOR CUFF REPAIR Left    TONSILLECTOMY     TRANSESOPHAGEAL ECHOCARDIOGRAM  2012   Overall left ventricular systolic function was normal. Left ventricular ejection fraction was estimated, range being 55% to 60%. There were no left ventricular regional wall motional abnormalities. Left ventricular wall thickness was mildly increased. There was mild focal basal septal hypertrophy.   WISDOM TOOTH EXTRACTION      SOCIAL HISTORY: Social History   Socioeconomic History   Marital status: Married    Spouse name: Not on file    Number of children: Not on file   Years of education: Not on file   Highest education level: Not on file  Occupational History   Occupation: Retired    Fish farm manager: UNEMPLOYED  Tobacco Use   Smoking status: Former    Packs/day: 1.00    Years: 10.00    Pack years: 10.00    Types: Cigarettes    Quit date: 05/06/1978    Years since quitting: 42.8   Smokeless tobacco: Never   Tobacco comments:    smoked [place], up to 1 ppd  Vaping Use   Vaping Use: Never used  Substance and Sexual Activity   Alcohol use: Yes    Alcohol/week: 4.0 standard drinks    Types: 4 Glasses of wine per week   Drug use: No   Sexual activity: Yes    Partners: Male    Birth control/protection: Post-menopausal  Other Topics Concern   Not on file  Social History Narrative   Married   Regular exercise: Yes, high level CVE, walks 2 mpd 5x/ week & gym   No specific diet   Social Determinants of Health   Financial Resource Strain: Not on file  Food Insecurity: Not on file  Transportation Needs: Not on file  Physical Activity: Not on file  Stress: Not on file  Social Connections: Not on file  Intimate Partner Violence: Not on file    FAMILY HISTORY: Family History  Problem Relation Age of Onset   Hypertension Mother    Coronary artery disease Mother        no MI   Lung cancer Mother        smoker   Colon cancer Father 34   Coronary artery disease Father        no MI   Prostate cancer Father    Diabetes Father    Hypertension Sister    Cancer Maternal Grandfather        GI   Diabetes Paternal Grandfather    Stroke Neg Hx    Esophageal cancer Neg Hx    Stomach cancer Neg Hx    Rectal cancer Neg Hx     ALLERGIES:  is allergic to shrimp [shellfish allergy], hydrocodone, and vicodin [hydrocodone-acetaminophen].  MEDICATIONS:  Current Outpatient Medications  Medication Sig Dispense Refill   amLODipine (NORVASC) 10 MG tablet Take 1 tablet (10 mg total) by mouth daily. 30 tablet 0    benazepril (LOTENSIN) 40 MG tablet Take 1 tablet (40 mg total) by mouth daily. 30 tablet 0   denosumab (PROLIA) 60 MG/ML SOLN injection Inject 60 mg into the skin every 6 (six) months. Administer in upper arm, thigh, or abdomen     pravastatin (PRAVACHOL) 20 MG tablet TAKE 1 TABLET (20 MG TOTAL) BY MOUTH DAILY. (Patient not taking: Reported on 01/30/2021) 90 tablet 3   rosuvastatin (CRESTOR) 20 MG tablet 1 tablet     No current facility-administered medications for this visit.    REVIEW OF SYSTEMS:   .10 Point review of Systems was done is negative except  as noted above.  PHYSICAL EXAMINATION: Telemedicine visit  LABORATORY DATA:  I have reviewed the data as listed  . CBC Latest Ref Rng & Units 01/30/2021 03/14/2014 01/05/2013  WBC 4.0 - 10.5 K/uL 5.5 4.5 5.0  Hemoglobin 12.0 - 15.0 g/dL 11.4(L) 12.1 12.6  Hematocrit 36.0 - 46.0 % 34.6(L) 37.2 36.8  Platelets 150 - 400 K/uL 214 247.0 224.0  .CBC    Component Value Date/Time   WBC 5.5 01/30/2021 1211   RBC 3.70 (L) 01/30/2021 1211   HGB 11.4 (L) 01/30/2021 1211   HCT 34.6 (L) 01/30/2021 1211   PLT 214 01/30/2021 1211   MCV 93.5 01/30/2021 1211   MCH 30.8 01/30/2021 1211   MCHC 32.9 01/30/2021 1211   RDW 13.1 01/30/2021 1211   LYMPHSABS 1.6 01/30/2021 1211   MONOABS 0.4 01/30/2021 1211   EOSABS 0.1 01/30/2021 1211   BASOSABS 0.1 01/30/2021 1211    CMP Latest Ref Rng & Units 01/30/2021 07/31/2020 03/23/2018  Glucose 70 - 99 mg/dL 94 - -  BUN 8 - 23 mg/dL 29(H) - -  Creatinine 0.44 - 1.00 mg/dL 1.19(H) 1.20(H) 0.65  Sodium 135 - 145 mmol/L 142 - -  Potassium 3.5 - 5.1 mmol/L 3.7 - -  Chloride 98 - 111 mmol/L 107 - -  CO2 22 - 32 mmol/L 23 - -  Calcium 8.9 - 10.3 mg/dL 9.7 - -  Total Protein 6.5 - 8.1 g/dL 7.5 - -  Total Bilirubin 0.3 - 1.2 mg/dL 0.5 - -  Alkaline Phos 38 - 126 U/L 42 - -  AST 15 - 41 U/L 30 - -  ALT 0 - 44 U/L 20 - -   Component     Latest Ref Rng & Units 01/30/2021 02/01/2021  Total Protein,  Urine-UPE24     Not Estab. mg/dL  12.2  Total Protein, Urine-Ur/day     30 - 150 mg/24 hr  271 (H)  ALBUMIN, U     %  29.1  ALPHA 1 URINE     %  2.5  Alpha 2, Urine     %  6.2  % BETA, Urine     %  27.0  GAMMA GLOBULIN URINE     %  35.2  Free Kappa Lt Chains,Ur     1.17 - 86.46 mg/L  31.61  Free Lambda Lt Chains,Ur     0.27 - 15.21 mg/L  144.52 (H)  Free Kappa/Lambda Ratio     1.83 - 14.26  0.22 (L)  Immunofixation Result, Urine       Comment (A)  Total Volume       2,225  M-SPIKE %, Urine     Not Observed %  19.2 (H)  M-Spike, mg/24 hr     Not Observed mg/24 hr  52 (H)  NOTE:       Comment  IgG (Immunoglobin G), Serum     586 - 1,602 mg/dL 610   IgA     64 - 422 mg/dL 91   IgM (Immunoglobulin M), Srm     26 - 217 mg/dL 79   Total Protein ELP     6.0 - 8.5 g/dL 6.8   Albumin SerPl Elph-Mcnc     2.9 - 4.4 g/dL 4.3   Alpha 1     0.0 - 0.4 g/dL 0.2   Alpha2 Glob SerPl Elph-Mcnc     0.4 - 1.0 g/dL 0.7   B-Globulin SerPl Elph-Mcnc     0.7 - 1.3 g/dL  1.0   Gamma Glob SerPl Elph-Mcnc     0.4 - 1.8 g/dL 0.6   M Protein SerPl Elph-Mcnc     Not Observed g/dL Not Observed   Globulin, Total     2.2 - 3.9 g/dL 2.5   Albumin/Glob SerPl     0.7 - 1.7 1.8 (H)   IFE 1      Comment   Please Note (HCV):      Comment   Iron     41 - 142 ug/dL 65   TIBC     236 - 444 ug/dL 461 (H)   Saturation Ratios     21 - 57 % 14 (L)   UIBC     120 - 384 ug/dL 396 (H)   Kappa free light chain     3.3 - 19.4 mg/L 11.8   Lambda free light chains     5.7 - 26.3 mg/L 480.8 (H)   Kappa, lambda light chain ratio     0.26 - 1.65 0.02 (L)   Ferritin     11 - 307 ng/mL 22   Sed Rate     0 - 22 mm/hr 12   Beta-2 Microglobulin     0.6 - 2.4 mg/L 1.7   LDH     98 - 192 U/L 225 (H)    RADIOGRAPHIC STUDIES: I have personally reviewed the radiological images as listed and agreed with the findings in the report. DG Bone Survey Met  Result Date: 01/29/2021 CLINICAL DATA:   Bence-Jones proteinuria EXAM: METASTATIC BONE SURVEY COMPARISON:  Chest abdomen pelvis CT 07/31/2020. FINDINGS: No evidence of lytic lesions throughout the axial or appendicular skeleton. 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 Subchondral cysts appear typically degenerative rather than representing myelomatous lesions. Diffuse degenerative change throughout the cervical, thoracic, and lumbar spine. No vertebral body compression. Stable anterolisthesis of L4 on L5 from prior CT. IMPRESSION: 1. No evidence of myelomatous lesion. 2. 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 Subchondral cysts appear typically degenerative rather than representing myelomatous lesions. 3. Diffuse degenerative change throughout the cervical, thoracic, and lumbar spine. Stable anterolisthesis of L4 on L5. Electronically Signed   By: Keith Rake M.D.   On: 01/29/2021 10:58    ASSESSMENT & PLAN:   70 year old female with history of hypertrophic cardiomyopathy, hypertension, dyslipidemia with  #1 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  History of hypertension, dyslipidemia #5 normocytic anemia -some element of iron deficiency cannot rule out contribution from plasma cell dyscrasia though at this time anemia does not meet criteria for myeloma. . Lab Results  Component Value Date   IRON 65 01/30/2021   TIBC 461 (H) 01/30/2021   IRONPCTSAT 14 (L) 01/30/2021   (Iron and TIBC)  Lab Results  Component Value Date   FERRITIN 22 01/30/2021    Plan  CBC showed hemoglobin of 11.4 with an MCV of 93.5 WBC count of 5.5k and platelets of 214k CMP shows creatinine of 1.19,  normal calcium level of 9.7 LDH 225 Microglobulin level of 1.7 Myeloma panel showed no evidence of monoclonal protein with no M spike Serum free light chains showed significantly elevated lambda free light chains to 481 with normal serum free kappa light chains with  a kappa lambda light chain ratio of 0.02.  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  -Recommended patient stop taking over-the-counter p.o. iron to optimize her iron level to ferritin of more than 50 and iron saturation of more than 20% to try to address her anemia.  -Discussed and patient is agreeable to PET CT scan to evaluate her possible myeloma (light chain producing) CT-guided bone marrow aspiration and biopsy.  Follow-up PET CT scan 1 week CT bone marrow aspiration and biopsy patient prefers to have this done before 10/10 or after 10/15 Return to clinic with Dr. Irene Limbo in 3 weeks  All of the patients questions were answered with apparent satisfaction. The patient knows to call the clinic with any problems, questions or concerns.  . The total time spent in the appointment was 23 minutes and more than 50% was on counseling and direct patient cares.    Sullivan Lone MD [place] AAHIVMS [facility] Hematology/Oncology Physician [facility]

## 2021-02-20 ENCOUNTER — Ambulatory Visit (HOSPITAL_COMMUNITY)
Admission: RE | Admit: 2021-02-20 | Discharge: 2021-02-20 | Disposition: A | Discharge status: Home / Self Care | Payer: Medicare Other | Source: Ambulatory Visit | Attending: Hematology | Admitting: Hematology

## 2021-02-20 DIAGNOSIS — R803 Bence Jones proteinuria: Secondary | ICD-10-CM | POA: Diagnosis not present

## 2021-02-20 LAB — GLUCOSE, CAPILLARY: Glucose-Capillary: 88 mg/dL (ref 70–99)

## 2021-02-20 MED ORDER — FLUDEOXYGLUCOSE F - 18 (FDG) INJECTION
5.2000 | Freq: Once | INTRAVENOUS | Status: AC
  Administered 2021-02-20: 5.94 via INTRAVENOUS

## 2021-02-21 DIAGNOSIS — Z1231 Encounter for screening mammogram for malignant neoplasm of breast: Secondary | ICD-10-CM | POA: Diagnosis not present

## 2021-02-23 ENCOUNTER — Encounter: Payer: Self-pay | Admitting: Obstetrics and Gynecology

## 2021-02-23 ENCOUNTER — Telehealth: Payer: Self-pay | Admitting: Hematology

## 2021-02-23 NOTE — Telephone Encounter (Signed)
Sch per 10/20 inbasket, left msg

## 2021-02-27 ENCOUNTER — Ambulatory Visit: Payer: Medicare Other | Admitting: Hematology

## 2021-03-08 ENCOUNTER — Other Ambulatory Visit: Payer: Self-pay | Admitting: Radiology

## 2021-03-12 ENCOUNTER — Other Ambulatory Visit: Payer: Self-pay

## 2021-03-12 ENCOUNTER — Ambulatory Visit (HOSPITAL_COMMUNITY)
Admission: RE | Admit: 2021-03-12 | Discharge: 2021-03-12 | Disposition: A | Discharge status: Home / Self Care | Payer: Medicare Other | Source: Ambulatory Visit | Attending: Hematology | Admitting: Hematology

## 2021-03-12 ENCOUNTER — Encounter (HOSPITAL_COMMUNITY): Payer: Self-pay

## 2021-03-12 DIAGNOSIS — D649 Anemia, unspecified: Secondary | ICD-10-CM | POA: Insufficient documentation

## 2021-03-12 DIAGNOSIS — C9000 Multiple myeloma not having achieved remission: Secondary | ICD-10-CM | POA: Insufficient documentation

## 2021-03-12 DIAGNOSIS — I129 Hypertensive chronic kidney disease with stage 1 through stage 4 chronic kidney disease, or unspecified chronic kidney disease: Secondary | ICD-10-CM | POA: Insufficient documentation

## 2021-03-12 DIAGNOSIS — N189 Chronic kidney disease, unspecified: Secondary | ICD-10-CM | POA: Diagnosis not present

## 2021-03-12 DIAGNOSIS — R803 Bence Jones proteinuria: Secondary | ICD-10-CM | POA: Insufficient documentation

## 2021-03-12 DIAGNOSIS — Z853 Personal history of malignant neoplasm of breast: Secondary | ICD-10-CM | POA: Insufficient documentation

## 2021-03-12 DIAGNOSIS — E785 Hyperlipidemia, unspecified: Secondary | ICD-10-CM | POA: Insufficient documentation

## 2021-03-12 LAB — CBC WITH DIFFERENTIAL/PLATELET
Abs Immature Granulocytes: 0.01 10*3/uL (ref 0.00–0.07)
Basophils Absolute: 0.1 10*3/uL (ref 0.0–0.1)
Basophils Relative: 1 %
Eosinophils Absolute: 0.1 10*3/uL (ref 0.0–0.5)
Eosinophils Relative: 3 %
HCT: 33.4 % — ABNORMAL LOW (ref 36.0–46.0)
Hemoglobin: 10.6 g/dL — ABNORMAL LOW (ref 12.0–15.0)
Immature Granulocytes: 0 %
Lymphocytes Relative: 32 %
Lymphs Abs: 1.5 10*3/uL (ref 0.7–4.0)
MCH: 30.6 pg (ref 26.0–34.0)
MCHC: 31.7 g/dL (ref 30.0–36.0)
MCV: 96.5 fL (ref 80.0–100.0)
Monocytes Absolute: 0.4 10*3/uL (ref 0.1–1.0)
Monocytes Relative: 8 %
Neutro Abs: 2.6 10*3/uL (ref 1.7–7.7)
Neutrophils Relative %: 56 %
Platelets: 179 10*3/uL (ref 150–400)
RBC: 3.46 MIL/uL — ABNORMAL LOW (ref 3.87–5.11)
RDW: 13.5 % (ref 11.5–15.5)
WBC: 4.7 10*3/uL (ref 4.0–10.5)
nRBC: 0 % (ref 0.0–0.2)

## 2021-03-12 MED ORDER — MIDAZOLAM HCL 2 MG/2ML IJ SOLN
INTRAMUSCULAR | Status: AC | PRN
  Administered 2021-03-12: .5 mg via INTRAVENOUS
  Administered 2021-03-12: 1 mg via INTRAVENOUS
  Administered 2021-03-12: .5 mg via INTRAVENOUS

## 2021-03-12 MED ORDER — FENTANYL CITRATE (PF) 100 MCG/2ML IJ SOLN
INTRAMUSCULAR | Status: AC | PRN
  Administered 2021-03-12 (×2): 25 ug via INTRAVENOUS
  Administered 2021-03-12: 50 ug via INTRAVENOUS

## 2021-03-12 MED ORDER — MIDAZOLAM HCL 2 MG/2ML IJ SOLN
INTRAMUSCULAR | Status: AC
  Filled 2021-03-12: qty 4

## 2021-03-12 MED ORDER — FENTANYL CITRATE (PF) 100 MCG/2ML IJ SOLN
INTRAMUSCULAR | Status: AC
  Filled 2021-03-12: qty 2

## 2021-03-12 MED ORDER — SODIUM CHLORIDE 0.9 % IV SOLN: INTRAVENOUS | Status: DC

## 2021-03-12 NOTE — Procedures (Signed)
Vascular and Interventional Radiology Procedure Note  Patient: Traci [name] DOB: May 13, 1950 Medical Record Number: [id number] Note Date/Time: 03/12/21 9:40 AM   Performing Physician: Michaelle Birks, MD Assistant(s): None  Diagnosis: Proteinuria  Procedure: BONE MARROW ASPIRATION AND BIOPSY  Anesthesia: Conscious Sedation Complications: None Estimated Blood Loss: Minimal Specimens: Sent for Pathology  Findings:  Successful CT-guided bone marrow aspiration and biopsy A total of 2 cores were obtained. Hemostasis of the tract was achieved using Manual Pressure.  Plan: Bed rest for 1 hours.  See detailed procedure note with images in PACS. The patient tolerated the procedure well without incident or complication and was returned to Recovery in stable condition.    Michaelle Birks, MD Vascular [facility] Interventional Radiology [facility] Radiology   Pager. [place]

## 2021-03-12 NOTE — Discharge Instructions (Addendum)
Please call Interventional Radiology clinic [phone] with any questions or concerns.  You may remove your dressing and shower tomorrow. Keep site clean and dry and replace with clean bandaid as necessary.   Do not submerge in a tub or pool until site is healed.    Bone Marrow Aspiration and Bone Marrow Biopsy, Adult, Care After This sheet gives you information about how to care for yourself after your procedure. Your health care provider may also give you more specific instructions. If you have problems or questions, contact your health care provider. What can I expect after the procedure? After the procedure, it is common to have: Mild pain and tenderness. Swelling. Bruising. Follow these instructions at home: Puncture site care Follow instructions from your health care provider about how to take care of the puncture site. Make sure you: Wash your hands with soap and water before and after you change your bandage (dressing). If soap and water are not available, use hand sanitizer. Change your dressing as told by your health care provider. Check your puncture site every day for signs of infection. Check for: More redness, swelling, or pain. Fluid or blood. Warmth. Pus or a bad smell.   Activity Return to your normal activities as told by your health care provider. Ask your health care provider what activities are safe for you. Do not lift anything that is heavier than 10 lb (4.5 kg), or the limit that you are told, until your health care provider says that it is safe. Do not drive for 24 hours if you were given a sedative during your procedure. General instructions Take over-the-counter and prescription medicines only as told by your health care provider. Do not take baths, swim, or use a hot tub until your health care provider approves. Ask your health care provider if you may take showers. You may only be allowed to take sponge baths. If directed, put ice on the affected area. To  do this: Put ice in a plastic bag. Place a towel between your skin and the bag. Leave the ice on for 20 minutes, 2-3 times a day. Keep all follow-up visits as told by your health care provider. This is important.   Contact a health care provider if: Your pain is not controlled with medicine. You have a fever. You have more redness, swelling, or pain around the puncture site. You have fluid or blood coming from the puncture site. Your puncture site feels warm to the touch. You have pus or a bad smell coming from the puncture site. Summary After the procedure, it is common to have mild pain, tenderness, swelling, and bruising. Follow instructions from your health care provider about how to take care of the puncture site and what activities are safe for you. Take over-the-counter and prescription medicines only as told by your health care provider. Contact a health care provider if you have any signs of infection, such as fluid or blood coming from the puncture site. This information is not intended to replace advice given to you by your health care provider. Make sure you discuss any questions you have with your health care provider. Document Revised: 09/08/2018 Document Reviewed: 09/08/2018 Elsevier Patient Education  2021 [employer].   Moderate Conscious Sedation, Adult, Care After This sheet gives you information about how to care for yourself after your procedure. Your health care provider may also give you more specific instructions. If you have problems or questions, contact your health care provider. What can I expect after  the procedure? After the procedure, it is common to have: Sleepiness for several hours. Impaired judgment for several hours. Difficulty with balance. Vomiting if you eat too soon. Follow these instructions at home: For the time period you were told by your health care provider: Rest. Do not participate in activities where you could fall or become  injured. Do not drive or use machinery. Do not drink alcohol. Do not take sleeping pills or medicines that cause drowsiness. Do not make important decisions or sign legal documents. Do not take care of children on your own.      Eating and drinking Follow the diet recommended by your health care provider. Drink enough fluid to keep your urine pale yellow. If you vomit: Drink water, juice, or soup when you can drink without vomiting. Make sure you have little or no nausea before eating solid foods.   General instructions Take over-the-counter and prescription medicines only as told by your health care provider. Have a responsible adult stay with you for the time you are told. It is important to have someone help care for you until you are awake and alert. Do not smoke. Keep all follow-up visits as told by your health care provider. This is important. Contact a health care provider if: You are still sleepy or having trouble with balance after 24 hours. You feel light-headed. You keep feeling nauseous or you keep vomiting. You develop a rash. You have a fever. You have redness or swelling around the IV site. Get help right away if: You have trouble breathing. You have new-onset confusion at home. Summary After the procedure, it is common to feel sleepy, have impaired judgment, or feel nauseous if you eat too soon. Rest after you get home. Know the things you should not do after the procedure. Follow the diet recommended by your health care provider and drink enough fluid to keep your urine pale yellow. Get help right away if you have trouble breathing or new-onset confusion at home. This information is not intended to replace advice given to you by your health care provider. Make sure you discuss any questions you have with your health care provider. Document Revised: 08/20/2019 Document Reviewed: 03/18/2019 Elsevier Patient Education  2021 [employer].

## 2021-03-12 NOTE — H&P (Signed)
Chief Complaint: Patient was seen in consultation today for bone marrow biopsy.  Referring Physician(s): Brunetta Genera  Supervising Physician: Michaelle Birks  Patient Status: [facility] - Out-pt  History of Present Illness: Traci [name] is a 70 y.o. female with a past medical history significant for HTN, HLD, anemia, breast cancer and CKD who presents today for bone marrow biopsy with moderate sedation. Traci [name] was referred to heme/onc in September of this year due to the presence of Bence-Jones protein in a random urine sample. She underwent further lab work up which was concerning for possible multiple myeloma. She has been referred to IR for a bone marrow biopsy for further work up.  Past Medical History:  Diagnosis Date   ASHD (arteriosclerotic heart disease)    Mild septal hypertrophy, 2D ECHO 11/2006, Dr. Percival Spanish   Breast cancer [facility])    PMH of, Dr. Marylene Buerger, Dr. Truddie Coco  multilpe sites overlaping, ER +   Facet syndrome, lumbar    Dr. Lynann Bologna   Heart murmur    no problems   History of COVID-19 05/07/2019   Hyperlipidemia    Hyperlipidemia    Hypertension    Osteopenia    Skin cancer, basal cell    Dr. Amy Martinique    Past Surgical History:  Procedure Laterality Date   CATARACT EXTRACTION Right 06/2020   CHOLECYSTECTOMY  about 1993   COLONOSCOPY      X 3 negative (last 2008, due 2015), Dr. Olevia Perches   MASTECTOMY Left 11/2004   Dr. Marylene Buerger, also reduction of right breast and reconstruction on the left.   ROTATOR CUFF REPAIR Left    TONSILLECTOMY     TRANSESOPHAGEAL ECHOCARDIOGRAM  2012   Overall left ventricular systolic function was normal. Left ventricular ejection fraction was estimated, range being 55% to 60%. There were no left ventricular regional wall motional abnormalities. Left ventricular wall thickness was mildly increased. There was mild focal basal septal hypertrophy.   WISDOM TOOTH EXTRACTION      Allergies: Shrimp [shellfish allergy],  Hydrocodone, and Vicodin [hydrocodone-acetaminophen]  Medications: Prior to Admission medications   Medication Sig Start Date End Date Taking? Authorizing Provider  amLODipine (NORVASC) 10 MG tablet Take 1 tablet (10 mg total) by mouth daily. 10/31/14  Yes Hendricks Limes, MD  benazepril (LOTENSIN) 40 MG tablet Take 1 tablet (40 mg total) by mouth daily. 10/31/14  Yes Hendricks Limes, MD  iron polysaccharides (NIFEREX) 150 MG capsule Take 1 capsule (150 mg total) by mouth daily. 02/12/21  Yes Brunetta Genera, MD  rosuvastatin (CRESTOR) 20 MG tablet 1 tablet 08/01/20  Yes [provider]  denosumab (PROLIA) 60 MG/ML SOLN injection Inject 60 mg into the skin every 6 (six) months. Administer in upper arm, thigh, or abdomen    [provider]  pravastatin (PRAVACHOL) 20 MG tablet TAKE 1 TABLET (20 MG TOTAL) BY MOUTH DAILY. Patient not taking: Reported on 01/30/2021 07/19/13   Minus Breeding, MD     Family History  Problem Relation Age of Onset   Hypertension Mother    Coronary artery disease Mother        no MI   Lung cancer Mother        smoker   Colon cancer Father 80   Coronary artery disease Father        no MI   Prostate cancer Father    Diabetes Father    Hypertension Sister    Cancer Maternal Grandfather  GI   Diabetes Paternal Grandfather    Stroke Neg Hx    Esophageal cancer Neg Hx    Stomach cancer Neg Hx    Rectal cancer Neg Hx     Social History   Socioeconomic History   Marital status: Married    Spouse name: Not on file   Number of children: Not on file   Years of education: Not on file   Highest education level: Not on file  Occupational History   Occupation: Retired    Fish farm manager: UNEMPLOYED  Tobacco Use   Smoking status: Former    Packs/day: 1.00    Years: 10.00    Pack years: 10.00    Types: Cigarettes    Quit date: 05/06/1978    Years since quitting: 42.8   Smokeless tobacco: Never   Tobacco comments:    smoked [place], up to 1 ppd  Vaping Use   Vaping Use: Never used  Substance and Sexual Activity   Alcohol use: Yes    Alcohol/week: 4.0 standard drinks    Types: 4 Glasses of wine per week   Drug use: No   Sexual activity: Yes    Partners: Male    Birth control/protection: Post-menopausal  Other Topics Concern   Not on file  Social History Narrative   Married   Regular exercise: Yes, high level CVE, walks 2 mpd 5x/ week & gym   No specific diet   Social Determinants of Health   Financial Resource Strain: Not on file  Food Insecurity: Not on file  Transportation Needs: Not on file  Physical Activity: Not on file  Stress: Not on file  Social Connections: Not on file     Review of Systems: A 12 point ROS discussed and pertinent positives are indicated in the HPI above.  All other systems are negative.  Review of Systems  Constitutional:  Negative for chills and fever.  Respiratory:  Negative for cough and shortness of breath.   Cardiovascular:  Negative for chest pain.  Gastrointestinal:  Negative for abdominal pain, nausea and vomiting.  Musculoskeletal:  Negative for back pain.  Skin:  Negative for rash.  Neurological:  Negative for headaches.   Vital Signs: BP (!) 147/76   Pulse 65   Temp 97.9 F (36.6 C) (Oral)   Resp 15   Ht _0  (1.6 m)   Wt 120 lb (54.4 kg)   LMP 05/07/1999 (LMP Unknown)   SpO2 100%   BMI 21.26 kg/m   Physical Exam Vitals reviewed.  Constitutional:      General: She is not in acute distress. HENT:     Head: Normocephalic.     Mouth/Throat:     Mouth: Mucous membranes are moist.     Pharynx: Oropharynx is clear. No oropharyngeal exudate or posterior oropharyngeal erythema.  Cardiovascular:     Rate and Rhythm: Normal rate and regular rhythm.  Pulmonary:     Effort: Pulmonary effort is normal.     Breath sounds: Normal breath sounds.  Abdominal:     General: There is no distension.     Palpations: Abdomen is soft.     Tenderness: There  is no abdominal tenderness.  Skin:    General: Skin is warm and dry.  Neurological:     Mental Status: She is alert and oriented to person, place, and time.  Psychiatric:        Mood and Affect: Mood normal.        Behavior: Behavior  normal.        Thought Content: Thought content normal.        Judgment: Judgment normal.     MD Evaluation Airway: WNL Heart: WNL Abdomen: WNL Chest/ Lungs: WNL ASA  Classification: 2 Mallampati/Airway Score: One   Imaging: NM PET Image Initial (PI) Whole Body  Result Date: 02/21/2021 CLINICAL DATA:  Initial treatment strategy for Bence Jones proteinuria. EXAM: NUCLEAR MEDICINE PET WHOLE BODY TECHNIQUE: 5.9 mCi F-18 FDG was injected intravenously. Full-ring PET imaging was performed from the head to foot after the radiotracer. CT data was obtained and used for attenuation correction and anatomic localization. Fasting blood glucose: 88 mg/dl COMPARISON:  Bone scan 01/29/2021 and CT chest abdomen pelvis 07/31/2020. FINDINGS: Mediastinal blood pool activity: SUV max 2.2 HEAD/NECK: No abnormal hypermetabolism. Incidental CT findings: None. CHEST: No hypermetabolic mediastinal hilar or axillary nodes. Incidental CT findings: Aberrant right subclavian artery. Atherosclerotic calcification of the aorta, aortic valve and coronary arteries. Heart is enlarged. Decreased attenuation of the intravascular compartment is indicative of anemia. No pericardial or pleural effusion. ABDOMEN/PELVIS: No abnormal hypermetabolism in the liver, adrenal glands, spleen or pancreas. No hypermetabolic lymph nodes. Incidental CT findings: Liver is unremarkable. Cholecystectomy. Adrenal glands are unremarkable. Punctate stones in the kidneys bilaterally. Spleen, pancreas, stomach and bowel are grossly unremarkable. Atherosclerotic calcification of the aorta. SKELETON: No abnormal osseous hypermetabolism. Incidental CT findings: Degenerative changes in the spine and shoulders. EXTREMITIES:  No abnormal hypermetabolism. Incidental CT findings: None. IMPRESSION: 1. No evidence of hypermetabolic malignancy. 2. Punctate bilateral renal stones. 3. Aortic atherosclerosis (ICD10-I70.0). Coronary artery calcification. Electronically Signed   By: Lorin Picket M.D.   On: 02/21/2021 14:40    Labs:  CBC: Recent Labs    01/30/21 1211 03/12/21 0730  WBC 5.5 4.7  HGB 11.4* 10.6*  HCT 34.6* 33.4*  PLT 214 179    COAGS: No results for input(s): INR, APTT in the last 8760 hours.  BMP: Recent Labs    07/31/20 1612 01/30/21 1211  NA  --  142  K  --  3.7  CL  --  107  CO2  --  23  GLUCOSE  --  94  BUN  --  29*  CALCIUM  --  9.7  CREATININE 1.20* 1.19*  GFRNONAA  --  49*    LIVER FUNCTION TESTS: Recent Labs    01/30/21 1211  BILITOT 0.5  AST 30  ALT 20  ALKPHOS 42  PROT 7.5  ALBUMIN 4.6    TUMOR MARKERS: No results for input(s): AFPTM, CEA, CA199, CHROMGRNA in the last 8760 hours.  Assessment and Plan:  70 y/o F with history of CKD found to have Bence-Jones proteins in recent random urine sample concerning for possible multiple myeloma who presents today for bone marrow biopsy as part of further work up.  Risks and benefits of bone marrow biopsy was discussed with the patient and/or patient's family including, but not limited to bleeding, infection, damage to adjacent structures or low yield requiring additional tests.  All of the questions were answered and there is agreement to proceed.  Consent signed and in chart.   Thank you for this interesting consult.  I greatly enjoyed meeting Traci [name] and look forward to participating in their care.  A copy of this report was sent to the requesting provider on this date.  Electronically Signed: Joaquim Nam, PA-C 03/12/2021, 8:12 AM   I spent a total of 30 Minutes   in face to face  in clinical consultation, greater than 50% of which was counseling/coordinating care for bone marrow biopsy.

## 2021-03-14 LAB — SURGICAL PATHOLOGY

## 2021-03-19 DIAGNOSIS — R82998 Other abnormal findings in urine: Secondary | ICD-10-CM | POA: Diagnosis not present

## 2021-03-19 DIAGNOSIS — R7301 Impaired fasting glucose: Secondary | ICD-10-CM | POA: Diagnosis not present

## 2021-03-19 DIAGNOSIS — E785 Hyperlipidemia, unspecified: Secondary | ICD-10-CM | POA: Diagnosis not present

## 2021-03-19 DIAGNOSIS — I10 Essential (primary) hypertension: Secondary | ICD-10-CM | POA: Diagnosis not present

## 2021-03-20 ENCOUNTER — Inpatient Hospital Stay: Payer: Medicare Other | Attending: Hematology | Admitting: Hematology

## 2021-03-20 ENCOUNTER — Other Ambulatory Visit: Payer: Self-pay

## 2021-03-20 VITALS — BP 142/84 | HR 76 | Temp 97.7°F | Resp 18 | Wt 121.5 lb

## 2021-03-20 DIAGNOSIS — R803 Bence Jones proteinuria: Secondary | ICD-10-CM | POA: Diagnosis not present

## 2021-03-20 DIAGNOSIS — C9000 Multiple myeloma not having achieved remission: Secondary | ICD-10-CM | POA: Insufficient documentation

## 2021-03-20 DIAGNOSIS — D649 Anemia, unspecified: Secondary | ICD-10-CM | POA: Insufficient documentation

## 2021-03-21 ENCOUNTER — Encounter (HOSPITAL_COMMUNITY): Payer: Self-pay | Admitting: Hematology

## 2021-03-21 ENCOUNTER — Telehealth: Payer: Self-pay | Admitting: Hematology

## 2021-03-21 NOTE — Telephone Encounter (Signed)
Scheduled follow-up appointments per 11/15 los. Patient is aware.

## 2021-03-22 DIAGNOSIS — Z23 Encounter for immunization: Secondary | ICD-10-CM | POA: Diagnosis not present

## 2021-03-23 DIAGNOSIS — N1831 Chronic kidney disease, stage 3a: Secondary | ICD-10-CM | POA: Diagnosis not present

## 2021-03-23 DIAGNOSIS — E785 Hyperlipidemia, unspecified: Secondary | ICD-10-CM | POA: Diagnosis not present

## 2021-03-23 DIAGNOSIS — D649 Anemia, unspecified: Secondary | ICD-10-CM | POA: Diagnosis not present

## 2021-03-23 DIAGNOSIS — Z1331 Encounter for screening for depression: Secondary | ICD-10-CM | POA: Diagnosis not present

## 2021-03-23 DIAGNOSIS — I2510 Atherosclerotic heart disease of native coronary artery without angina pectoris: Secondary | ICD-10-CM | POA: Diagnosis not present

## 2021-03-23 DIAGNOSIS — Z1339 Encounter for screening examination for other mental health and behavioral disorders: Secondary | ICD-10-CM | POA: Diagnosis not present

## 2021-03-23 DIAGNOSIS — R803 Bence Jones proteinuria: Secondary | ICD-10-CM | POA: Diagnosis not present

## 2021-03-23 DIAGNOSIS — M810 Age-related osteoporosis without current pathological fracture: Secondary | ICD-10-CM | POA: Diagnosis not present

## 2021-03-23 DIAGNOSIS — Z0000 Encounter for general adult medical examination without abnormal findings: Secondary | ICD-10-CM | POA: Diagnosis not present

## 2021-03-23 DIAGNOSIS — I700 Atherosclerosis of aorta: Secondary | ICD-10-CM | POA: Diagnosis not present

## 2021-03-23 DIAGNOSIS — R7301 Impaired fasting glucose: Secondary | ICD-10-CM | POA: Diagnosis not present

## 2021-03-23 DIAGNOSIS — C9000 Multiple myeloma not having achieved remission: Secondary | ICD-10-CM | POA: Diagnosis not present

## 2021-03-23 DIAGNOSIS — I129 Hypertensive chronic kidney disease with stage 1 through stage 4 chronic kidney disease, or unspecified chronic kidney disease: Secondary | ICD-10-CM | POA: Diagnosis not present

## 2021-03-23 DIAGNOSIS — Z853 Personal history of malignant neoplasm of breast: Secondary | ICD-10-CM | POA: Diagnosis not present

## 2021-03-27 NOTE — Progress Notes (Signed)
PET/CT was to evaluate for multiple myeloma

## 2021-03-28 NOTE — Addendum Note (Signed)
Addended by: Sullivan Lone on: 03/28/2021 04:32 PM   Modules accepted: Orders

## 2021-03-28 NOTE — Progress Notes (Signed)
Traci [name]   HEMATOLOGY/ONCOLOGY CLINIC NOTE  Date of Service: .03/20/2021   Patient Care Team: Traci [name]., MD as PCP - General (Internal Medicine)  CHIEF COMPLAINTS/PURPOSE OF CONSULTATION:  R/o Myeloma  HISTORY OF PRESENTING ILLNESS:   Traci [name] is a wonderful 70 y.o. female who has been referred to [place] by Dr .Traci [name], Traci [name]., MD  for evaluation and management of Bence Jones Proteinuria.  Patient had work-up for mild anemia and progressive chronic kidney disease and was noted to have Bence-Jones proteinuria on a random urine sample.  This iwas not quantifiable.  Patient notes she has lost about 30 pounds after having COVID-19 infection in January 2021 but has since gained back some of that weight and is eating better. Meuth no focal bone pains , no new hypercalcemia.  Stable anemia.  Recent bone survey that did not show any obvious metastatic lesions..  We discussed the importance and diagnostic possibilities in the context of Bence-Jones proteinuria including the possibility of multiple myeloma or lymphomas.  INTERVAL HISTORY  Traci [name] is here for follow-up to discuss the evaluation for plasma cell dyscrasia.  Patient notes no acute new symptoms.   Since her last clinic visit she had PET CT scan on 02/20/2021 which showed no evidence of hypermetabolic malignancy. Patient had a bone marrow aspiration and biopsy on 03/12/2021 which showed -Normocellular bone marrow with plasma cell neoplasm. The bone marrow is generally normocellular for age with trilineage hematopoiesis.  In this background, the plasma cells are increased in number representing 12% of cells associated with small clusters in the clot and biopsy sections.  The plasma cells display lambda light chain restriction consistent with plasma cell neoplasm.  Patient's labs have shown mild anemia hemoglobin 11.4 on 01/30/2021 but on repeat labs on 03/12/2021 hemoglobin is 10.6. CMP creatinine of 1.19-1.2 24-hour  UPEP showed 271 mg /24h per 24 hours of protein with it predominantly being lambda light chains @ 144.57m/L making up 52 mg/24h of protein.  We discussed her presentation could be smoldering myeloma or could be very early active multiple myeloma.  She does have some anemia but part of it is from iron deficiency.  She does have some proteinuria and some mild renal insufficiency but it is unclear if this is related to her myeloma or known hypertension.    MEDICAL HISTORY:  Past Medical History:  Diagnosis Date   ASHD (arteriosclerotic heart disease)    Mild septal hypertrophy, 2D ECHO 11/2006, Dr. HPercival Spanish  Breast cancer ([facility]    PMH of, Dr. PMarylene Buerger Dr. RTruddie Coco multilpe sites overlaping, ER +   Facet syndrome, lumbar    Dr. VLynann Bologna  Heart murmur    no problems   History of COVID-19 05/07/2019   Hyperlipidemia    Hyperlipidemia    Hypertension    Osteopenia    Skin cancer, basal cell    Dr. Amy [name]    SURGICAL HISTORY: Past Surgical History:  Procedure Laterality Date   CATARACT EXTRACTION Right 06/2020   CHOLECYSTECTOMY  about 1993   COLONOSCOPY      X 3 negative (last 2008, due 2015), Dr. BOlevia Perches  MASTECTOMY Left 11/2004   Dr. PMarylene Buerger also reduction of right breast and reconstruction on the left.   ROTATOR CUFF REPAIR Left    TONSILLECTOMY     TRANSESOPHAGEAL ECHOCARDIOGRAM  2012   Overall left ventricular systolic function was normal. Left ventricular ejection fraction was estimated, range being  55% to 60%. There were no left ventricular regional wall motional abnormalities. Left ventricular wall thickness was mildly increased. There was mild focal basal septal hypertrophy.   WISDOM TOOTH EXTRACTION      SOCIAL HISTORY: Social History   Socioeconomic History   Marital status: Married    Spouse name: Not on file   Number of children: Not on file   Years of education: Not on file   Highest education level: Not on file  Occupational History   Occupation:  Retired    Fish farm manager: UNEMPLOYED  Tobacco Use   Smoking status: Former    Packs/day: 1.00    Years: 10.00    Pack years: 10.00    Types: Cigarettes    Quit date: 05/06/1978    Years since quitting: 42.9   Smokeless tobacco: Never   Tobacco comments:    smoked [place], up to 1 ppd  Vaping Use   Vaping Use: Never used  Substance and Sexual Activity   Alcohol use: Yes    Alcohol/week: 4.0 standard drinks    Types: 4 Glasses of wine per week   Drug use: No   Sexual activity: Yes    Partners: Male    Birth control/protection: Post-menopausal  Other Topics Concern   Not on file  Social History Narrative   Married   Regular exercise: Yes, high level CVE, walks 2 mpd 5x/ week & gym   No specific diet   Social Determinants of Health   Financial Resource Strain: Not on file  Food Insecurity: Not on file  Transportation Needs: Not on file  Physical Activity: Not on file  Stress: Not on file  Social Connections: Not on file  Intimate Partner Violence: Not on file    FAMILY HISTORY: Family History  Problem Relation Age of Onset   Hypertension Mother    Coronary artery disease Mother        no MI   Lung cancer Mother        smoker   Colon cancer Father 2   Coronary artery disease Father        no MI   Prostate cancer Father    Diabetes Father    Hypertension Sister    Cancer Maternal Grandfather        GI   Diabetes Paternal Grandfather    Stroke Neg Hx    Esophageal cancer Neg Hx    Stomach cancer Neg Hx    Rectal cancer Neg Hx     ALLERGIES:  is allergic to shrimp [shellfish allergy], hydrocodone, and vicodin [hydrocodone-acetaminophen].  MEDICATIONS:  Current Outpatient Medications  Medication Sig Dispense Refill   amLODipine (NORVASC) 10 MG tablet Take 1 tablet (10 mg total) by mouth daily. 30 tablet 0   benazepril (LOTENSIN) 40 MG tablet Take 1 tablet (40 mg total) by mouth daily. 30 tablet 0   denosumab (PROLIA) 60 MG/ML SOLN injection Inject 60 mg into  the skin every 6 (six) months. Administer in upper arm, thigh, or abdomen     iron polysaccharides (NIFEREX) 150 MG capsule Take 1 capsule (150 mg total) by mouth daily. 30 capsule 2   rosuvastatin (CRESTOR) 20 MG tablet 1 tablet     pravastatin (PRAVACHOL) 20 MG tablet TAKE 1 TABLET (20 MG TOTAL) BY MOUTH DAILY. (Patient not taking: No sig reported) 90 tablet 3   No current facility-administered medications for this visit.    REVIEW OF SYSTEMS:   .10 Point review of Systems was done is  negative except as noted above.  PHYSICAL EXAMINATION: Telemedicine visit  LABORATORY DATA:  I have reviewed the data as listed  . CBC Latest Ref Rng & Units 03/12/2021 01/30/2021 03/14/2014  WBC 4.0 - 10.5 K/uL 4.7 5.5 4.5  Hemoglobin 12.0 - 15.0 g/dL 10.6(L) 11.4(L) 12.1  Hematocrit 36.0 - 46.0 % 33.4(L) 34.6(L) 37.2  Platelets 150 - 400 K/uL 179 214 247.0  .CBC    Component Value Date/Time   WBC 4.7 03/12/2021 0730   RBC 3.46 (L) 03/12/2021 0730   HGB 10.6 (L) 03/12/2021 0730   HCT 33.4 (L) 03/12/2021 0730   PLT 179 03/12/2021 0730   MCV 96.5 03/12/2021 0730   MCH 30.6 03/12/2021 0730   MCHC 31.7 03/12/2021 0730   RDW 13.5 03/12/2021 0730   LYMPHSABS 1.5 03/12/2021 0730   MONOABS 0.4 03/12/2021 0730   EOSABS 0.1 03/12/2021 0730   BASOSABS 0.1 03/12/2021 0730    CMP Latest Ref Rng & Units 01/30/2021 07/31/2020 03/23/2018  Glucose 70 - 99 mg/dL 94 - -  BUN 8 - 23 mg/dL 29(H) - -  Creatinine 0.44 - 1.00 mg/dL 1.19(H) 1.20(H) 0.65  Sodium 135 - 145 mmol/L 142 - -  Potassium 3.5 - 5.1 mmol/L 3.7 - -  Chloride 98 - 111 mmol/L 107 - -  CO2 22 - 32 mmol/L 23 - -  Calcium 8.9 - 10.3 mg/dL 9.7 - -  Total Protein 6.5 - 8.1 g/dL 7.5 - -  Total Bilirubin 0.3 - 1.2 mg/dL 0.5 - -  Alkaline Phos 38 - 126 U/L 42 - -  AST 15 - 41 U/L 30 - -  ALT 0 - 44 U/L 20 - -   Component     Latest Ref Rng & Units 01/30/2021 02/01/2021  Total Protein, Urine-UPE24     Not Estab. mg/dL  12.2  Total Protein,  Urine-Ur/day     30 - 150 mg/24 hr  271 (H)  ALBUMIN, U     %  29.1  ALPHA 1 URINE     %  2.5  Alpha 2, Urine     %  6.2  % BETA, Urine     %  27.0  GAMMA GLOBULIN URINE     %  35.2  Free Kappa Lt Chains,Ur     1.17 - 86.46 mg/L  31.61  Free Lambda Lt Chains,Ur     0.27 - 15.21 mg/L  144.52 (H)  Free Kappa/Lambda Ratio     1.83 - 14.26  0.22 (L)  Immunofixation Result, Urine       Comment (A)  Total Volume       2,225  M-SPIKE %, Urine     Not Observed %  19.2 (H)  M-Spike, mg/24 hr     Not Observed mg/24 hr  52 (H)  NOTE:       Comment  IgG (Immunoglobin G), Serum     586 - 1,602 mg/dL 610   IgA     64 - 422 mg/dL 91   IgM (Immunoglobulin M), Srm     26 - 217 mg/dL 79   Total Protein ELP     6.0 - 8.5 g/dL 6.8   Albumin SerPl Elph-Mcnc     2.9 - 4.4 g/dL 4.3   Alpha 1     0.0 - 0.4 g/dL 0.2   Alpha2 Glob SerPl Elph-Mcnc     0.4 - 1.0 g/dL 0.7   B-Globulin SerPl Elph-Mcnc     0.7 -  1.3 g/dL 1.0   Gamma Glob SerPl Elph-Mcnc     0.4 - 1.8 g/dL 0.6   M Protein SerPl Elph-Mcnc     Not Observed g/dL Not Observed   Globulin, Total     2.2 - 3.9 g/dL 2.5   Albumin/Glob SerPl     0.7 - 1.7 1.8 (H)   IFE 1      Comment   Please Note (HCV):      Comment   Iron     41 - 142 ug/dL 65   TIBC     236 - 444 ug/dL 461 (H)   Saturation Ratios     21 - 57 % 14 (L)   UIBC     120 - 384 ug/dL 396 (H)   Kappa free light chain     3.3 - 19.4 mg/L 11.8   Lambda free light chains     5.7 - 26.3 mg/L 480.8 (H)   Kappa, lambda light chain ratio     0.26 - 1.65 0.02 (L)   Ferritin     11 - 307 ng/mL 22   Sed Rate     0 - 22 mm/hr 12   Beta-2 Microglobulin     0.6 - 2.4 mg/L 1.7   LDH     98 - 192 U/L 225 (H)    Surgical Pathology  CASE: [id number]  PATIENT: Melea Dunavant  Bone Marrow Report      Clinical History: Multiple myeloma , Bence-jones proteinuria  (BH)      DIAGNOSIS:   BONE MARROW, ASPIRATE, CLOT, CORE:  -Normocellular bone marrow with  plasma cell neoplasm  -See comment   PERIPHERAL BLOOD:  -Normocytic-normochromic anemia   COMMENT:   The bone marrow is generally normocellular for age with trilineage  hematopoiesis.  In this background, the plasma cells are increased in  number representing 12% of cells associated with small clusters in the  clot and biopsy sections.  The plasma cells display lambda light chain  restriction consistent with plasma cell neoplasm.  Correlation with  cytogenetic and FISH studies is recommended.     RADIOGRAPHIC STUDIES: I have personally reviewed the radiological images as listed and agreed with the findings in the report. CT Biopsy  Result Date: 03/12/2021 INDICATION: Multiple myeloma EXAM: CT GUIDED BONE MARROW ASPIRATION AND CORE BIOPSY MEDICATIONS: None. ANESTHESIA/SEDATION: Moderate (conscious) sedation was employed during this procedure. A total of 2 milligrams versed and 100 micrograms fentanyl were administered intravenously. The patient's level of consciousness and vital signs were monitored continuously by radiology nursing throughout the procedure under my direct supervision. Total monitored sedation time: 17 minutes FLUOROSCOPY TIME:  CT dose was not reported. COMPLICATIONS: None immediate. Estimated blood loss: <5 mL PROCEDURE: Informed written consent was obtained from the patient after a thorough discussion of the procedural risks, benefits and alternatives. All questions were addressed. Maximal Sterile Barrier Technique was utilized including caps, mask, sterile gowns, sterile gloves, sterile drape, hand hygiene and skin antiseptic. A timeout was performed prior to the initiation of the procedure. The patient was positioned prone and non-contrast localization CT was performed of the pelvis to demonstrate the iliac marrow spaces. Maximal barrier sterile technique utilized including caps, mask, sterile gowns, sterile gloves, large sterile drape, hand hygiene, and chlorhexidine prep.  Under sterile conditions and local anesthesia, an 11 gauge coaxial bone biopsy needle was advanced into the RIGHT iliac marrow space. Needle position was confirmed with CT imaging. Initially, bone marrow aspiration was performed. Next,  the 11 gauge outer cannula was utilized to obtain a 1 iliac bone marrow core biopsy. Needle was removed. Hemostasis was obtained with compression. The patient tolerated the procedure well. Samples were prepared with the cytotechnologist. IMPRESSION: Successful CT-guided bone marrow aspiration and biopsy, as above. Michaelle Birks, MD [facility] Radiology Electronically Signed   By: Michaelle Birks M.D.   On: 03/12/2021 18:32   CT BONE MARROW BIOPSY & ASPIRATION  Result Date: 03/12/2021 INDICATION: Multiple myeloma EXAM: CT GUIDED BONE MARROW ASPIRATION AND CORE BIOPSY MEDICATIONS: None. ANESTHESIA/SEDATION: Moderate (conscious) sedation was employed during this procedure. A total of 2 milligrams versed and 100 micrograms fentanyl were administered intravenously. The patient's level of consciousness and vital signs were monitored continuously by radiology nursing throughout the procedure under my direct supervision. Total monitored sedation time: 17 minutes FLUOROSCOPY TIME:  CT dose was not reported. COMPLICATIONS: None immediate. Estimated blood loss: <5 mL PROCEDURE: Informed written consent was obtained from the patient after a thorough discussion of the procedural risks, benefits and alternatives. All questions were addressed. Maximal Sterile Barrier Technique was utilized including caps, mask, sterile gowns, sterile gloves, sterile drape, hand hygiene and skin antiseptic. A timeout was performed prior to the initiation of the procedure. The patient was positioned prone and non-contrast localization CT was performed of the pelvis to demonstrate the iliac marrow spaces. Maximal barrier sterile technique utilized including caps, mask,  sterile gowns, sterile gloves, large sterile drape, hand hygiene, and chlorhexidine prep. Under sterile conditions and local anesthesia, an 11 gauge coaxial bone biopsy needle was advanced into the RIGHT iliac marrow space. Needle position was confirmed with CT imaging. Initially, bone marrow aspiration was performed. Next, the 11 gauge outer cannula was utilized to obtain a 1 iliac bone marrow core biopsy. Needle was removed. Hemostasis was obtained with compression. The patient tolerated the procedure well. Samples were prepared with the cytotechnologist. IMPRESSION: Successful CT-guided bone marrow aspiration and biopsy, as above. Michaelle Birks, MD [facility] Radiology Electronically Signed   By: Michaelle Birks M.D.   On: 03/12/2021 18:32    ASSESSMENT & PLAN:   70 year old female with history of hypertrophic cardiomyopathy, hypertension, dyslipidemia with  #1  Newly diagnosed multiple myeloma  Smoldering vs Active Bone marrow biopsy with 12% lambda restricted plasma cells PET CT scan with no hypermetabolic bone lesions Cytogenetics normal female karyotype Molecular cytogenetics atypical t (11;14) Component     Latest Ref Rng & Units 01/30/2021  IgG (Immunoglobin G), Serum     586 - 1,602 mg/dL 610  IgA     64 - 422 mg/dL 91  IgM (Immunoglobulin M), Srm     26 - 217 mg/dL 79  Total Protein ELP     6.0 - 8.5 g/dL 6.8  Albumin SerPl Elph-Mcnc     2.9 - 4.4 g/dL 4.3  Alpha 1     0.0 - 0.4 g/dL 0.2  Alpha2 Glob SerPl Elph-Mcnc     0.4 - 1.0 g/dL 0.7  B-Globulin SerPl Elph-Mcnc     0.7 - 1.3 g/dL 1.0  Gamma Glob SerPl Elph-Mcnc     0.4 - 1.8 g/dL 0.6  M Protein SerPl Elph-Mcnc     Not Observed g/dL Not Observed  Globulin, Total     2.2 - 3.9 g/dL 2.5  Albumin/Glob SerPl     0.7 - 1.7 1.8 (H)  IFE 1      Comment  Please Note (HCV):  Comment  Kappa free light chain     3.3 - 19.4 mg/L 11.8  Lambda free light chains     5.7 -  26.3 mg/L 480.8 (H)  Kappa, lambda light chain ratio     0.26 - 1.65 0.02 (L)   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  History of hypertension, dyslipidemia #5 normocytic anemia -some element of iron deficiency cannot rule out contribution from plasma cell dyscrasia though at this time anemia does not meet criteria for myeloma. . Lab Results  Component Value Date   IRON 65 01/30/2021   TIBC 461 (H) 01/30/2021   IRONPCTSAT 14 (L) 01/30/2021   (Iron and TIBC)  Lab Results  Component Value Date   FERRITIN 22 01/30/2021    Plan  CBC showed hemoglobin of 11.4 with an MCV of 93.5 WBC count of 5.5k and platelets of 214k.  Repeat CBC on 11/7 did show the CBC count down to 10.6. CMP shows creatinine of 1.19, normal calcium level of 9.7 LDH 225 Microglobulin level of 1.7 Myeloma panel showed no evidence of monoclonal protein with no M spike Serum free light chains showed significantly elevated lambda free light chains to 481 with normal serum free kappa light chains with a kappa lambda light chain ratio of 0.02. 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 PET CT scan did not show any evidence of active bone lesions Biopsy consistent with myeloma with 12% lambda restricted plasma cells and molecular cytogenetics showing t(11;14) mutation which would be consistent with myeloma. -Iron polysaccharide 125m po daily and if tolerated twice daily to optimize her iron level to ferritin of more than 50 and iron saturation of more than 20% to try to address her anemia. -We will refer her to nephrology for her chronic kidney disease with proteinuria ?  Myeloma kidney versus light chain  nephropathy  Versus hypertension  [facility] 04/13/2021 Phone visit with Dr KIrene Limbo12/15/2022  All of the patients questions were answered with apparent satisfaction. The patient knows to call the clinic with any problems, questions or concerns.  . The total time spent in the appointment was 40 minutes and more than 50% was on counseling and direct patient cares.    GSullivan LoneMD MOgdenAAHIVMS SSouth[facility] HospitalCBerkeley Medical CenterHematology/Oncology Physician [facility]

## 2021-04-02 DIAGNOSIS — M25511 Pain in right shoulder: Secondary | ICD-10-CM | POA: Diagnosis not present

## 2021-04-02 DIAGNOSIS — Z1212 Encounter for screening for malignant neoplasm of rectum: Secondary | ICD-10-CM | POA: Diagnosis not present

## 2021-04-12 ENCOUNTER — Other Ambulatory Visit: Payer: Self-pay

## 2021-04-12 DIAGNOSIS — C9000 Multiple myeloma not having achieved remission: Secondary | ICD-10-CM

## 2021-04-13 ENCOUNTER — Inpatient Hospital Stay: Payer: Medicare Other | Attending: Hematology

## 2021-04-13 ENCOUNTER — Other Ambulatory Visit: Payer: Self-pay

## 2021-04-13 DIAGNOSIS — C9000 Multiple myeloma not having achieved remission: Secondary | ICD-10-CM | POA: Diagnosis not present

## 2021-04-13 DIAGNOSIS — R803 Bence Jones proteinuria: Secondary | ICD-10-CM | POA: Insufficient documentation

## 2021-04-13 LAB — IRON AND TIBC
Iron: 74 ug/dL (ref 41–142)
Saturation Ratios: 18 % — ABNORMAL LOW (ref 21–57)
TIBC: 413 ug/dL (ref 236–444)
UIBC: 339 ug/dL (ref 120–384)

## 2021-04-13 LAB — FERRITIN: Ferritin: 47 ng/mL (ref 11–307)

## 2021-04-13 LAB — CMP (CANCER CENTER ONLY)
ALT: 22 U/L (ref 0–44)
AST: 33 U/L (ref 15–41)
Albumin: 4.4 g/dL (ref 3.5–5.0)
Alkaline Phosphatase: 43 U/L (ref 38–126)
Anion gap: 11 (ref 5–15)
BUN: 39 mg/dL — ABNORMAL HIGH (ref 8–23)
CO2: 22 mmol/L (ref 22–32)
Calcium: 9.5 mg/dL (ref 8.9–10.3)
Chloride: 105 mmol/L (ref 98–111)
Creatinine: 1.52 mg/dL — ABNORMAL HIGH (ref 0.44–1.00)
GFR, Estimated: 36 mL/min — ABNORMAL LOW (ref >60)
Glucose, Bld: 98 mg/dL (ref 70–99)
Potassium: 3.8 mmol/L (ref 3.5–5.1)
Sodium: 138 mmol/L (ref 135–145)
Total Bilirubin: 0.5 mg/dL (ref 0.3–1.2)
Total Protein: 7.2 g/dL (ref 6.5–8.1)

## 2021-04-13 LAB — CBC WITH DIFFERENTIAL (CANCER CENTER ONLY)
Abs Immature Granulocytes: 0.01 10*3/uL (ref 0.00–0.07)
Basophils Absolute: 0.1 10*3/uL (ref 0.0–0.1)
Basophils Relative: 1 %
Eosinophils Absolute: 0.2 10*3/uL (ref 0.0–0.5)
Eosinophils Relative: 3 %
HCT: 32.8 % — ABNORMAL LOW (ref 36.0–46.0)
Hemoglobin: 11.1 g/dL — ABNORMAL LOW (ref 12.0–15.0)
Immature Granulocytes: 0 %
Lymphocytes Relative: 33 %
Lymphs Abs: 1.7 10*3/uL (ref 0.7–4.0)
MCH: 30.7 pg (ref 26.0–34.0)
MCHC: 33.8 g/dL (ref 30.0–36.0)
MCV: 90.6 fL (ref 80.0–100.0)
Monocytes Absolute: 0.3 10*3/uL (ref 0.1–1.0)
Monocytes Relative: 6 %
Neutro Abs: 2.9 10*3/uL (ref 1.7–7.7)
Neutrophils Relative %: 57 %
Platelet Count: 187 10*3/uL (ref 150–400)
RBC: 3.62 MIL/uL — ABNORMAL LOW (ref 3.87–5.11)
RDW: 13.4 % (ref 11.5–15.5)
WBC Count: 5.2 10*3/uL (ref 4.0–10.5)
nRBC: 0 % (ref 0.0–0.2)

## 2021-04-13 LAB — LACTATE DEHYDROGENASE: LDH: 198 U/L — ABNORMAL HIGH (ref 98–192)

## 2021-04-13 LAB — SEDIMENTATION RATE: Sed Rate: 14 mm/hr (ref 0–22)

## 2021-04-16 LAB — KAPPA/LAMBDA LIGHT CHAINS
Kappa free light chain: 15.6 mg/L (ref 3.3–19.4)
Kappa, lambda light chain ratio: 0.03 — ABNORMAL LOW (ref 0.26–1.65)
Lambda free light chains: 589.5 mg/L — ABNORMAL HIGH (ref 5.7–26.3)

## 2021-04-17 DIAGNOSIS — M810 Age-related osteoporosis without current pathological fracture: Secondary | ICD-10-CM | POA: Diagnosis not present

## 2021-04-18 LAB — MULTIPLE MYELOMA PANEL, SERUM
Albumin SerPl Elph-Mcnc: 4 g/dL (ref 2.9–4.4)
Albumin/Glob SerPl: 1.7 (ref 0.7–1.7)
Alpha 1: 0.2 g/dL (ref 0.0–0.4)
Alpha2 Glob SerPl Elph-Mcnc: 0.7 g/dL (ref 0.4–1.0)
B-Globulin SerPl Elph-Mcnc: 0.9 g/dL (ref 0.7–1.3)
Gamma Glob SerPl Elph-Mcnc: 0.6 g/dL (ref 0.4–1.8)
Globulin, Total: 2.4 g/dL (ref 2.2–3.9)
IgA: 80 mg/dL (ref 64–422)
IgG (Immunoglobin G), Serum: 597 mg/dL (ref 586–1602)
IgM (Immunoglobulin M), Srm: 76 mg/dL (ref 26–217)
Total Protein ELP: 6.4 g/dL (ref 6.0–8.5)

## 2021-04-19 ENCOUNTER — Inpatient Hospital Stay (HOSPITAL_BASED_OUTPATIENT_CLINIC_OR_DEPARTMENT_OTHER): Payer: Medicare Other | Admitting: Hematology

## 2021-04-19 DIAGNOSIS — C9000 Multiple myeloma not having achieved remission: Secondary | ICD-10-CM

## 2021-04-19 DIAGNOSIS — Z7189 Other specified counseling: Secondary | ICD-10-CM | POA: Diagnosis not present

## 2021-04-19 DIAGNOSIS — E860 Dehydration: Secondary | ICD-10-CM | POA: Diagnosis not present

## 2021-04-19 DIAGNOSIS — R803 Bence Jones proteinuria: Secondary | ICD-10-CM | POA: Diagnosis not present

## 2021-04-19 DIAGNOSIS — N058 Unspecified nephritic syndrome with other morphologic changes: Secondary | ICD-10-CM | POA: Diagnosis not present

## 2021-04-19 DIAGNOSIS — H2511 Age-related nuclear cataract, right eye: Secondary | ICD-10-CM | POA: Diagnosis not present

## 2021-04-20 ENCOUNTER — Other Ambulatory Visit (HOSPITAL_COMMUNITY): Payer: Self-pay

## 2021-04-20 ENCOUNTER — Telehealth: Payer: Self-pay

## 2021-04-20 ENCOUNTER — Telehealth: Payer: Self-pay | Admitting: Pharmacist

## 2021-04-20 ENCOUNTER — Encounter: Payer: Self-pay | Admitting: Hematology

## 2021-04-20 DIAGNOSIS — Z7189 Other specified counseling: Secondary | ICD-10-CM | POA: Insufficient documentation

## 2021-04-20 DIAGNOSIS — C9000 Multiple myeloma not having achieved remission: Secondary | ICD-10-CM | POA: Insufficient documentation

## 2021-04-20 MED ORDER — PROCHLORPERAZINE MALEATE 10 MG PO TABS: 10.0000 mg | ORAL_TABLET | Freq: Four times a day (QID) | ORAL | 1 refills | Status: DC | PRN

## 2021-04-20 MED ORDER — ONDANSETRON HCL 8 MG PO TABS: 8.0000 mg | ORAL_TABLET | Freq: Two times a day (BID) | ORAL | 1 refills | Status: DC | PRN

## 2021-04-20 MED ORDER — LORAZEPAM 0.5 MG PO TABS: 0.5000 mg | ORAL_TABLET | Freq: Four times a day (QID) | ORAL | 0 refills | Status: DC | PRN

## 2021-04-20 MED ORDER — ACYCLOVIR 400 MG PO TABS: 400.0000 mg | ORAL_TABLET | Freq: Two times a day (BID) | ORAL | 3 refills | Status: DC

## 2021-04-20 MED ORDER — LENALIDOMIDE 10 MG PO CAPS: 10.0000 mg | ORAL_CAPSULE | Freq: Every day | ORAL | 0 refills | Status: DC

## 2021-04-20 NOTE — Telephone Encounter (Signed)
Oral Oncology Patient Advocate Encounter   Received notification from [facility] that prior authorization for Revlimid is required.   PA submitted on CoverMyMeds Key [id number] Status is pending   Oral Oncology Clinic will continue to follow.   Traci [name] Phone [phone] Fax [phone] 04/20/2021 2:27 PM

## 2021-04-20 NOTE — Telephone Encounter (Addendum)
Erroneous encounter

## 2021-04-20 NOTE — Progress Notes (Signed)
START ON PATHWAY REGIMEN - Multiple Myeloma and Other Plasma Cell Dyscrasias     A cycle is every 21 days:     Bortezomib      Lenalidomide      Dexamethasone   **Always confirm dose/schedule in your pharmacy ordering system**  Patient Characteristics: Multiple Myeloma, Newly Diagnosed, Transplant Eligible, Unknown or Awaiting Test Results Disease Classification: Multiple Myeloma R-ISS Staging: Unknown Therapeutic Status: Newly Diagnosed Is Patient Eligible for Transplant<= Transplant Eligible Risk Status: Awaiting Test Results Intent of Therapy: Non-Curative / Palliative Intent, Discussed with Patient 

## 2021-04-24 ENCOUNTER — Inpatient Hospital Stay: Payer: Medicare Other

## 2021-04-24 ENCOUNTER — Other Ambulatory Visit: Payer: Self-pay

## 2021-04-24 DIAGNOSIS — E860 Dehydration: Secondary | ICD-10-CM

## 2021-04-24 DIAGNOSIS — R803 Bence Jones proteinuria: Secondary | ICD-10-CM | POA: Diagnosis not present

## 2021-04-24 DIAGNOSIS — C9000 Multiple myeloma not having achieved remission: Secondary | ICD-10-CM

## 2021-04-24 DIAGNOSIS — N058 Unspecified nephritic syndrome with other morphologic changes: Secondary | ICD-10-CM

## 2021-04-24 MED ORDER — SODIUM CHLORIDE 0.9 % IV SOLN: INTRAVENOUS | Status: DC

## 2021-04-24 NOTE — Progress Notes (Signed)
Fluids given today per orders.  Patient tolerated it well without problems. Discharged home from clinic ambulatory. Follow up as scheduled.

## 2021-04-24 NOTE — Patient Instructions (Signed)
[facility] MEDICAL ONCOLOGY  Discharge Instructions: Thank you for choosing [facility] to provide your oncology and hematology care.   If you have a lab appointment with the Cancer Center, please go directly to the Cancer Center and check in at the registration area.   Wear comfortable clothing and clothing appropriate for easy access to any Portacath or PICC line.   We strive to give you quality time with your provider. You may need to reschedule your appointment if you arrive late (15 or more minutes).  Arriving late affects you and other patients whose appointments are after yours.  Also, if you miss three or more appointments without notifying the office, you may be dismissed from the clinic at the provider's discretion.      For prescription refill requests, have your pharmacy contact our office and allow 72 hours for refills to be completed.       To help prevent nausea and vomiting after your treatment, we encourage you to take your nausea medication as directed.  BELOW ARE SYMPTOMS THAT SHOULD BE REPORTED IMMEDIATELY: *FEVER GREATER THAN 100.4 F (38 C) OR HIGHER *CHILLS OR SWEATING *NAUSEA AND VOMITING THAT IS NOT CONTROLLED WITH YOUR NAUSEA MEDICATION *UNUSUAL SHORTNESS OF BREATH *UNUSUAL BRUISING OR BLEEDING *URINARY PROBLEMS (pain or burning when urinating, or frequent urination) *BOWEL PROBLEMS (unusual diarrhea, constipation, pain near the anus) TENDERNESS IN MOUTH AND THROAT WITH OR WITHOUT PRESENCE OF ULCERS (sore throat, sores in mouth, or a toothache) UNUSUAL RASH, SWELLING OR PAIN  UNUSUAL VAGINAL DISCHARGE OR ITCHING   Items with * indicate a potential emergency and should be followed up as soon as possible or go to the Emergency Department if any problems should occur.  Please show the CHEMOTHERAPY ALERT CARD or IMMUNOTHERAPY ALERT CARD at check-in to the Emergency Department and triage nurse.  Should you have questions after your  visit or need to cancel or reschedule your appointment, please contact [facility] MEDICAL ONCOLOGY  Dept: [phone]  and follow the prompts.  Office hours are 8:00 a.m. to 4:30 p.m. Monday - Friday. Please note that voicemails left after 4:00 p.m. may not be returned until the following business day.  We are closed weekends and major holidays. You have access to a nurse at all times for urgent questions. Please call the main number to the clinic Dept: [phone] and follow the prompts.   For any non-urgent questions, you may also contact your provider using MyChart. We now offer e-Visits for anyone 18 and older to request care online for non-urgent symptoms. For details visit [email or web address].   Also download the MyChart app! Go to the app store, search "MyChart", open the app, select [place], and log in with your MyChart username and password.  Due to Covid, a mask is required upon entering the hospital/clinic. If you do not have a mask, one will be given to you upon arrival. For doctor visits, patients may have 1 support person aged 18 or older with them. For treatment visits, patients cannot have anyone with them due to current Covid guidelines and our immunocompromised population.   

## 2021-04-25 ENCOUNTER — Telehealth: Payer: Self-pay | Admitting: Hematology

## 2021-04-25 NOTE — Telephone Encounter (Signed)
Left message with follow-up appointments per 12/15 los.

## 2021-04-26 DIAGNOSIS — C9000 Multiple myeloma not having achieved remission: Secondary | ICD-10-CM | POA: Diagnosis not present

## 2021-04-27 ENCOUNTER — Ambulatory Visit: Payer: Medicare Other

## 2021-04-27 ENCOUNTER — Other Ambulatory Visit: Payer: Self-pay

## 2021-04-27 ENCOUNTER — Inpatient Hospital Stay: Payer: Medicare Other

## 2021-04-27 VITALS — BP 150/80 | HR 69 | Resp 16

## 2021-04-27 DIAGNOSIS — D4989 Neoplasm of unspecified behavior of other specified sites: Secondary | ICD-10-CM | POA: Diagnosis not present

## 2021-04-27 DIAGNOSIS — E860 Dehydration: Secondary | ICD-10-CM

## 2021-04-27 DIAGNOSIS — R803 Bence Jones proteinuria: Secondary | ICD-10-CM | POA: Diagnosis not present

## 2021-04-27 DIAGNOSIS — C9000 Multiple myeloma not having achieved remission: Secondary | ICD-10-CM | POA: Diagnosis not present

## 2021-04-27 MED ORDER — SODIUM CHLORIDE 0.9 % IV SOLN: INTRAVENOUS | Status: DC

## 2021-04-27 NOTE — Patient Instructions (Signed)
[facility]COL[facility]  Discharge Instructions: Thank you for choosing [facility] to provide your oncology and hematology care.   If you have a lab appointment with the Cancer Center, please go directly to the Cancer Center and check in at the registration area.   Wear comfortable clothing and clothing appropriate for easy access to any Portacath or PICC line.   We strive to give you quality time with your provider. You may need to reschedule your appointment if you arrive late (15 or more minutes).  Arriving late affects you and other patients whose appointments are after yours.  Also, if you miss three or more appointments without notifying the office, you may be dismissed from the clinic at the provider's discretion.      For prescription refill requests, have your pharmacy contact our office and allow 72 hours for refills to be completed.    Today you received the following chemotherapy and/or immunotherapy agents   To help prevent nausea and vomiting after your treatment, we encourage you to take your nausea medication as directed.  BELOW ARE SYMPTOMS THAT SHOULD BE REPORTED IMMEDIATELY: *FEVER GREATER THAN 100.4 F (38 C) OR HIGHER *CHILLS OR SWEATING *NAUSEA AND VOMITING THAT IS NOT CONTROLLED WITH YOUR NAUSEA MEDICATION *UNUSUAL SHORTNESS OF BREATH *UNUSUAL BRUISING OR BLEEDING *URINARY PROBLEMS (pain or burning when urinating, or frequent urination) *BOWEL PROBLEMS (unusual diarrhea, constipation, pain near the anus) TENDERNESS IN MOUTH AND THROAT WITH OR WITHOUT PRESENCE OF ULCERS (sore throat, sores in mouth, or a toothache) UNUSUAL RASH, SWELLING OR PAIN  UNUSUAL VAGINAL DISCHARGE OR ITCHING   Items with * indicate a potential emergency and should be followed up as soon as possible or go to the Emergency Department if any problems should occur.  Please show the CHEMOTHERAPY ALERT CARD or IMMUNOTHERAPY ALERT CARD at check-in to the Emergency  Department and triage nurse.  Should you have questions after your visit or need to cancel or reschedule your appointment, please contact [facility]  Dept: [phone]  and follow the prompts.  Office hours are 8:00 a.m. to 4:30 p.m. Monday - Friday. Please note that voicemails left after 4:00 p.m. may not be returned until the following business day.  We are closed weekends and major holidays. You have access to a nurse at all times for urgent questions. Please call the main number to the clinic Dept: [phone] and follow the prompts.   For any non-urgent questions, you may also contact your provider using MyChart. We now offer e-Visits for anyone 18 and older to request care online for non-urgent symptoms. For details visit [email or web address].   Also download the MyChart app! Go to the app store, search "MyChart", open the app, select [facility], and log in with your MyChart username and password.  Due to Covid, a mask is required upon entering the hospital/clinic. If you do not have a mask, one will be given to you upon arrival. For doctor visits, patients may have 1 support person aged 18 or older with them. For treatment visits, patients cannot have anyone with them due to current Covid guidelines and our immunocompromised population.   

## 2021-05-02 DIAGNOSIS — C9000 Multiple myeloma not having achieved remission: Secondary | ICD-10-CM | POA: Diagnosis not present

## 2021-05-02 DIAGNOSIS — Z79899 Other long term (current) drug therapy: Secondary | ICD-10-CM | POA: Diagnosis not present

## 2021-05-02 DIAGNOSIS — D4989 Neoplasm of unspecified behavior of other specified sites: Secondary | ICD-10-CM | POA: Diagnosis not present

## 2021-05-02 DIAGNOSIS — Z1159 Encounter for screening for other viral diseases: Secondary | ICD-10-CM | POA: Diagnosis not present

## 2021-05-02 DIAGNOSIS — Z885 Allergy status to narcotic agent status: Secondary | ICD-10-CM | POA: Diagnosis not present

## 2021-05-02 DIAGNOSIS — M19019 Primary osteoarthritis, unspecified shoulder: Secondary | ICD-10-CM | POA: Diagnosis not present

## 2021-05-02 DIAGNOSIS — Z801 Family history of malignant neoplasm of trachea, bronchus and lung: Secondary | ICD-10-CM | POA: Diagnosis not present

## 2021-05-02 DIAGNOSIS — I129 Hypertensive chronic kidney disease with stage 1 through stage 4 chronic kidney disease, or unspecified chronic kidney disease: Secondary | ICD-10-CM | POA: Diagnosis not present

## 2021-05-02 DIAGNOSIS — Z8042 Family history of malignant neoplasm of prostate: Secondary | ICD-10-CM | POA: Diagnosis not present

## 2021-05-02 DIAGNOSIS — N1831 Chronic kidney disease, stage 3a: Secondary | ICD-10-CM | POA: Diagnosis not present

## 2021-05-02 DIAGNOSIS — I422 Other hypertrophic cardiomyopathy: Secondary | ICD-10-CM | POA: Diagnosis not present

## 2021-05-04 ENCOUNTER — Other Ambulatory Visit: Payer: Medicare Other

## 2021-05-04 NOTE — Progress Notes (Signed)
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  Patient had work-up for mild anemia and progressive chronic kidney disease and was noted to have Bence-Jones proteinuria on a random urine sample.  This iwas not quantifiable.  Patient notes she has lost about 30 pounds after having COVID-19 infection in January 2021 but has since gained back some of that weight and is eating better. Meuth no focal bone pains , no new hypercalcemia.  Stable anemia.  Recent bone survey that did not show any obvious metastatic lesions..  We discussed the importance and diagnostic possibilities in the context of Bence-Jones proteinuria including the possibility of multiple myeloma or lymphomas.  INTERVAL HISTORY  .I connected with Deon Pilling on .04/19/2021 at 10:20 AM EST by telephone visit and verified that I am speaking with the correct person using two identifiers.   I discussed the limitations, risks, security and privacy concerns of performing an evaluation and management service by telemedicine and the availability of in-person appointments. I also discussed with the patient that there may be a patient responsible charge related to this service. The patient expressed understanding and agreed to proceed.   Other persons participating in the visit and their role in the encounter: Husband   Patients location: Home  Providers location: [place]  Chief Complaint: Follow-up to discuss interval labs and close monitoring of smoldering myeloma  versus multiple myeloma.  Patient notes that she is feeling about the same.  Notes no other acute new focal symptoms.  Has not had a visit with nephrology yet and does not believe she was called for an appointment yet though the referral was placed.  She was called to discuss her interval labs done on 04/13/2021 which showed hemoglobin of 11.1 with normal WBC count and platelets CMP showed increase in her creatinine from 1.19 up to 1.52 which is concerning. Serum lambda free light chains increased from 480.8 up to 590  Ferritin 47 with an iron saturation of 18% Patient notes no other acute new symptoms.  No new bone pains no new fatigue.    MEDICAL HISTORY:  Past Medical History:  Diagnosis Date   ASHD (arteriosclerotic heart disease)    Mild septal hypertrophy, 2D ECHO 11/2006, Dr. Percival Spanish   Breast cancer [facility])    PMH of, Dr. Marylene Buerger, Dr. Truddie Coco  multilpe sites overlaping, ER +   Facet syndrome, lumbar    Dr. Lynann Bologna   Heart murmur    no problems   History of COVID-19 05/07/2019   Hyperlipidemia    Hyperlipidemia    Hypertension    Osteopenia    Skin cancer, basal cell    Dr. Amy Martinique     SURGICAL HISTORY: Past Surgical History:  Procedure Laterality Date   CATARACT EXTRACTION Right 06/2020   CHOLECYSTECTOMY  about 1993   COLONOSCOPY      X 3 negative (last 2008, due 2015), Dr. Olevia Perches   MASTECTOMY Left 11/2004   Dr. Marylene Buerger, also reduction of right breast and reconstruction on the left.   ROTATOR  CUFF REPAIR Left    TONSILLECTOMY     TRANSESOPHAGEAL ECHOCARDIOGRAM  2012   Overall left ventricular systolic function was normal. Left ventricular ejection fraction was estimated, range being 55% to 60%. There were no left ventricular regional wall motional abnormalities. Left ventricular wall thickness was mildly increased. There was mild focal basal septal hypertrophy.   WISDOM TOOTH EXTRACTION      SOCIAL HISTORY: Social History   Socioeconomic History    Marital status: Married    Spouse name: Not on file   Number of children: Not on file   Years of education: Not on file   Highest education level: Not on file  Occupational History   Occupation: Retired    Fish farm manager: UNEMPLOYED  Tobacco Use   Smoking status: Former    Packs/day: 1.00    Years: 10.00    Pack years: 10.00    Types: Cigarettes    Quit date: 05/06/1978    Years since quitting: 43.0   Smokeless tobacco: Never   Tobacco comments:    smoked [place], up to 1 ppd  Vaping Use   Vaping Use: Never used  Substance and Sexual Activity   Alcohol use: Yes    Alcohol/week: 4.0 standard drinks    Types: 4 Glasses of wine per week   Drug use: No   Sexual activity: Yes    Partners: Male    Birth control/protection: Post-menopausal  Other Topics Concern   Not on file  Social History Narrative   Married   Regular exercise: Yes, high level CVE, walks 2 mpd 5x/ week & gym   No specific diet   Social Determinants of Health   Financial Resource Strain: Not on file  Food Insecurity: Not on file  Transportation Needs: Not on file  Physical Activity: Not on file  Stress: Not on file  Social Connections: Not on file  Intimate Partner Violence: Not on file    FAMILY HISTORY: Family History  Problem Relation Age of Onset   Hypertension Mother    Coronary artery disease Mother        no MI   Lung cancer Mother        smoker   Colon cancer Father 79   Coronary artery disease Father        no MI   Prostate cancer Father    Diabetes Father    Hypertension Sister    Cancer Maternal Grandfather        GI   Diabetes Paternal Grandfather    Stroke Neg Hx    Esophageal cancer Neg Hx    Stomach cancer Neg Hx    Rectal cancer Neg Hx     ALLERGIES:  is allergic to shrimp [shellfish allergy], hydrocodone, and vicodin [hydrocodone-acetaminophen].  MEDICATIONS:  Current Outpatient Medications  Medication Sig Dispense Refill   acyclovir (ZOVIRAX) 400 MG tablet Take 1  tablet (400 mg total) by mouth 2 (two) times daily. 60 tablet 3   amLODipine (NORVASC) 10 MG tablet Take 1 tablet (10 mg total) by mouth daily. 30 tablet 0   amLODipine (NORVASC) 10 MG tablet 1 tablet     benazepril (LOTENSIN) 40 MG tablet Take 1 tablet (40 mg total) by mouth daily. 30 tablet 0   benazepril (LOTENSIN) 40 MG tablet 1 tablet     denosumab (PROLIA) 60 MG/ML SOLN injection Inject 60 mg into the skin every 6 (six) months. Administer in upper arm, thigh, or abdomen     iron polysaccharides (NIFEREX)  150 MG capsule Take 1 capsule (150 mg total) by mouth daily. 30 capsule 2   lenalidomide (REVLIMID) 10 MG capsule Take 1 capsule (10 mg total) by mouth daily. Take 14 days on, 7 days off, repeat every 21 days. 14 capsule 0   LORazepam (ATIVAN) 0.5 MG tablet Take 1 tablet (0.5 mg total) by mouth every 6 (six) hours as needed (Nausea or vomiting). 30 tablet 0   ondansetron (ZOFRAN) 8 MG tablet Take 1 tablet (8 mg total) by mouth 2 (two) times daily as needed (Nausea or vomiting). 30 tablet 1   pravastatin (PRAVACHOL) 20 MG tablet TAKE 1 TABLET (20 MG TOTAL) BY MOUTH DAILY. (Patient not taking: No sig reported) 90 tablet 3   prochlorperazine (COMPAZINE) 10 MG tablet Take 1 tablet (10 mg total) by mouth every 6 (six) hours as needed (Nausea or vomiting). 30 tablet 1   rosuvastatin (CRESTOR) 20 MG tablet 1 tablet     No current facility-administered medications for this visit.    REVIEW OF SYSTEMS:   .10 Point review of Systems was done is negative except as noted above.  PHYSICAL EXAMINATION: Telemedicine visit  LABORATORY DATA:  I have reviewed the data as listed  . CBC Latest Ref Rng & Units 04/13/2021 03/12/2021 01/30/2021  WBC 4.0 - 10.5 K/uL 5.2 4.7 5.5  Hemoglobin 12.0 - 15.0 g/dL 11.1(L) 10.6(L) 11.4(L)  Hematocrit 36.0 - 46.0 % 32.8(L) 33.4(L) 34.6(L)  Platelets 150 - 400 K/uL 187 179 214  .CBC    Component Value Date/Time   WBC 5.2 04/13/2021 1023   WBC 4.7 03/12/2021 0730    RBC 3.62 (L) 04/13/2021 1023   HGB 11.1 (L) 04/13/2021 1023   HCT 32.8 (L) 04/13/2021 1023   PLT 187 04/13/2021 1023   MCV 90.6 04/13/2021 1023   MCH 30.7 04/13/2021 1023   MCHC 33.8 04/13/2021 1023   RDW 13.4 04/13/2021 1023   LYMPHSABS 1.7 04/13/2021 1023   MONOABS 0.3 04/13/2021 1023   EOSABS 0.2 04/13/2021 1023   BASOSABS 0.1 04/13/2021 1023    CMP Latest Ref Rng & Units 04/13/2021 01/30/2021 07/31/2020  Glucose 70 - 99 mg/dL 98 94 -  BUN 8 - 23 mg/dL 39(H) 29(H) -  Creatinine 0.44 - 1.00 mg/dL 1.52(H) 1.19(H) 1.20(H)  Sodium 135 - 145 mmol/L 138 142 -  Potassium 3.5 - 5.1 mmol/L 3.8 3.7 -  Chloride 98 - 111 mmol/L 105 107 -  CO2 22 - 32 mmol/L 22 23 -  Calcium 8.9 - 10.3 mg/dL 9.5 9.7 -  Total Protein 6.5 - 8.1 g/dL 7.2 7.5 -  Total Bilirubin 0.3 - 1.2 mg/dL 0.5 0.5 -  Alkaline Phos 38 - 126 U/L 43 42 -  AST 15 - 41 U/L 33 30 -  ALT 0 - 44 U/L 22 20 -   Component     Latest Ref Rng & Units 01/30/2021 02/01/2021  Total Protein, Urine-UPE24     Not Estab. mg/dL  12.2  Total Protein, Urine-Ur/day     30 - 150 mg/24 hr  271 (H)  ALBUMIN, U     %  29.1  ALPHA 1 URINE     %  2.5  Alpha 2, Urine     %  6.2  % BETA, Urine     %  27.0  GAMMA GLOBULIN URINE     %  35.2  Free Kappa Lt Chains,Ur     1.17 - 86.46 mg/L  31.61  Free Lambda Lt Chains,Ur  0.27 - 15.21 mg/L  144.52 (H)  Free Kappa/Lambda Ratio     1.83 - 14.26  0.22 (L)  Immunofixation Result, Urine       Comment (A)  Total Volume       2,225  M-SPIKE %, Urine     Not Observed %  19.2 (H)  M-Spike, mg/24 hr     Not Observed mg/24 hr  52 (H)  NOTE:       Comment  IgG (Immunoglobin G), Serum     586 - 1,602 mg/dL 610   IgA     64 - 422 mg/dL 91   IgM (Immunoglobulin M), Srm     26 - 217 mg/dL 79   Total Protein ELP     6.0 - 8.5 g/dL 6.8   Albumin SerPl Elph-Mcnc     2.9 - 4.4 g/dL 4.3   Alpha 1     0.0 - 0.4 g/dL 0.2   Alpha2 Glob SerPl Elph-Mcnc     0.4 - 1.0 g/dL 0.7   B-Globulin SerPl  Elph-Mcnc     0.7 - 1.3 g/dL 1.0   Gamma Glob SerPl Elph-Mcnc     0.4 - 1.8 g/dL 0.6   M Protein SerPl Elph-Mcnc     Not Observed g/dL Not Observed   Globulin, Total     2.2 - 3.9 g/dL 2.5   Albumin/Glob SerPl     0.7 - 1.7 1.8 (H)   IFE 1      Comment   Please Note (HCV):      Comment   Iron     41 - 142 ug/dL 65   TIBC     236 - 444 ug/dL 461 (H)   Saturation Ratios     21 - 57 % 14 (L)   UIBC     120 - 384 ug/dL 396 (H)   Kappa free light chain     3.3 - 19.4 mg/L 11.8   Lambda free light chains     5.7 - 26.3 mg/L 480.8 (H)   Kappa, lambda light chain ratio     0.26 - 1.65 0.02 (L)   Ferritin     11 - 307 ng/mL 22   Sed Rate     0 - 22 mm/hr 12   Beta-2 Microglobulin     0.6 - 2.4 mg/L 1.7   LDH     98 - 192 U/L 225 (H)    Surgical Pathology  CASE: [id number]  PATIENT: Heavyn Skalla  Bone Marrow Report      Clinical History: Multiple myeloma , Bence-jones proteinuria  (BH)      DIAGNOSIS:   BONE MARROW, ASPIRATE, CLOT, CORE:  -Normocellular bone marrow with plasma cell neoplasm  -See comment   PERIPHERAL BLOOD:  -Normocytic-normochromic anemia   COMMENT:   The bone marrow is generally normocellular for age with trilineage  hematopoiesis.  In this background, the plasma cells are increased in  number representing 12% of cells associated with small clusters in the  clot and biopsy sections.  The plasma cells display lambda light chain  restriction consistent with plasma cell neoplasm.  Correlation with  cytogenetic and FISH studies is recommended.     RADIOGRAPHIC STUDIES: I have personally reviewed the radiological images as listed and agreed with the findings in the report. No results found.  ASSESSMENT & PLAN:   70 year old female with history of hypertrophic cardiomyopathy, hypertension, dyslipidemia with  #1  Newly diagnosed multiple myeloma  Smoldering vs Active Bone marrow biopsy with 12% lambda restricted plasma cells PET CT  scan with no hypermetabolic bone lesions Cytogenetics normal female karyotype Molecular cytogenetics atypical t (11;14) Component     Latest Ref Rng & Units 01/30/2021  IgG (Immunoglobin G), Serum     586 - 1,602 mg/dL 610  IgA     64 - 422 mg/dL 91  IgM (Immunoglobulin M), Srm     26 - 217 mg/dL 79  Total Protein ELP     6.0 - 8.5 g/dL 6.8  Albumin SerPl Elph-Mcnc     2.9 - 4.4 g/dL 4.3  Alpha 1     0.0 - 0.4 g/dL 0.2  Alpha2 Glob SerPl Elph-Mcnc     0.4 - 1.0 g/dL 0.7  B-Globulin SerPl Elph-Mcnc     0.7 - 1.3 g/dL 1.0  Gamma Glob SerPl Elph-Mcnc     0.4 - 1.8 g/dL 0.6  M Protein SerPl Elph-Mcnc     Not Observed g/dL Not Observed  Globulin, Total     2.2 - 3.9 g/dL 2.5  Albumin/Glob SerPl     0.7 - 1.7 1.8 (H)  IFE 1      Comment  Please Note (HCV):      Comment  Kappa free light chain     3.3 - 19.4 mg/L 11.8  Lambda free light chains     5.7 - 26.3 mg/L 480.8 (H)  Kappa, lambda light chain ratio     0.26 - 1.65 0.02 (L)   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  History of hypertension, dyslipidemia #5 normocytic anemia -some element of iron deficiency cannot rule out contribution from plasma cell dyscrasia though at this time anemia does not meet criteria for myeloma. . Lab Results  Component Value Date   IRON 74 04/13/2021   TIBC 413 04/13/2021   IRONPCTSAT 18 (L) 04/13/2021   (Iron and TIBC)  Lab Results  Component Value Date   FERRITIN 47 04/13/2021    Plan I called and discussed the patient's labs from 04/13/2021 with her in details. -Her blood counts are stable with some mild anemia but her bump in her creatinine is somewhat concerning. -We discussed that her light chains also went up a little bit from 480-590  lambda free light chains -We discussed that she does need to see the nephrologist urgently to determine if her worsening renal function is related to her excessive light chain.  We discussed that typically for light chain nephropathy the serum free light chains are usually more than 1000 however myeloma can affect the kidney in different ways. -We tentatively discussed that if nephrology does fail that her worsening renal function is related to her monoclonal paraproteinemia that would be an indication to call this active multiple myeloma and start treating her as opposed to monitoring her as smoldering myeloma. -We discussed standard treatment options including a triple drug regimen like Velcade Revlimid and dexamethasone and discussed the pros and cons of initiating treatment early versus monitoring. -We discussed that we will set her up for IV fluids and she was strongly counseled to drink at least 2 L of water daily to try to help improve her renal function and keep her kidneys flushed out. -Improvement in her urine output might also improve her renal  function and light chain burden. -We will tentatively set her up to start treatment but she will follow-up with her to reevaluate labs prior to starting treatment and await nephrology input.  This will be done in the early part of January 2023.  Follow-up IV NS 1liter twice weekly x 2 weeks Labs on 12/30 Chemo-counseling for VRD 05/04/2021 Plz schedule for labs MD visit and start of VRD on 05/10/2021  Total time spent discussing lab results and counseling regarding indications for treatment 22 minutes  All of the patients questions were answered with apparent satisfaction. The patient knows to call the clinic with any problems, questions or concerns.   Sullivan Lone MD [place] [facility] Hematology/Oncology Physician [facility]

## 2021-05-07 ENCOUNTER — Encounter: Payer: Self-pay | Admitting: Hematology

## 2021-05-08 ENCOUNTER — Inpatient Hospital Stay: Payer: Medicare Other

## 2021-05-08 DIAGNOSIS — E7800 Pure hypercholesterolemia, unspecified: Secondary | ICD-10-CM | POA: Diagnosis not present

## 2021-05-08 DIAGNOSIS — Z801 Family history of malignant neoplasm of trachea, bronchus and lung: Secondary | ICD-10-CM | POA: Diagnosis not present

## 2021-05-08 DIAGNOSIS — Z79899 Other long term (current) drug therapy: Secondary | ICD-10-CM | POA: Diagnosis not present

## 2021-05-08 DIAGNOSIS — I129 Hypertensive chronic kidney disease with stage 1 through stage 4 chronic kidney disease, or unspecified chronic kidney disease: Secondary | ICD-10-CM | POA: Diagnosis not present

## 2021-05-08 DIAGNOSIS — N29 Other disorders of kidney and ureter in diseases classified elsewhere: Secondary | ICD-10-CM | POA: Diagnosis not present

## 2021-05-08 DIAGNOSIS — N183 Chronic kidney disease, stage 3 unspecified: Secondary | ICD-10-CM | POA: Diagnosis not present

## 2021-05-08 DIAGNOSIS — Z87891 Personal history of nicotine dependence: Secondary | ICD-10-CM | POA: Diagnosis not present

## 2021-05-08 DIAGNOSIS — Z885 Allergy status to narcotic agent status: Secondary | ICD-10-CM | POA: Diagnosis not present

## 2021-05-08 DIAGNOSIS — Z6822 Body mass index (BMI) 22.0-22.9, adult: Secondary | ICD-10-CM | POA: Diagnosis not present

## 2021-05-08 DIAGNOSIS — Z8042 Family history of malignant neoplasm of prostate: Secondary | ICD-10-CM | POA: Diagnosis not present

## 2021-05-08 DIAGNOSIS — Z888 Allergy status to other drugs, medicaments and biological substances status: Secondary | ICD-10-CM | POA: Diagnosis not present

## 2021-05-08 DIAGNOSIS — Z8616 Personal history of COVID-19: Secondary | ICD-10-CM | POA: Diagnosis not present

## 2021-05-08 DIAGNOSIS — D472 Monoclonal gammopathy: Secondary | ICD-10-CM | POA: Diagnosis not present

## 2021-05-14 ENCOUNTER — Ambulatory Visit: Payer: Medicare Other | Admitting: Hematology

## 2021-05-14 ENCOUNTER — Other Ambulatory Visit: Payer: Medicare Other

## 2021-05-14 ENCOUNTER — Ambulatory Visit: Payer: Medicare Other

## 2021-05-15 DIAGNOSIS — M810 Age-related osteoporosis without current pathological fracture: Secondary | ICD-10-CM | POA: Diagnosis not present

## 2021-05-15 DIAGNOSIS — Z841 Family history of disorders of kidney and ureter: Secondary | ICD-10-CM | POA: Diagnosis not present

## 2021-05-15 DIAGNOSIS — I129 Hypertensive chronic kidney disease with stage 1 through stage 4 chronic kidney disease, or unspecified chronic kidney disease: Secondary | ICD-10-CM | POA: Diagnosis not present

## 2021-05-15 DIAGNOSIS — N1831 Chronic kidney disease, stage 3a: Secondary | ICD-10-CM | POA: Diagnosis not present

## 2021-05-15 DIAGNOSIS — Z853 Personal history of malignant neoplasm of breast: Secondary | ICD-10-CM | POA: Diagnosis not present

## 2021-05-15 DIAGNOSIS — N179 Acute kidney failure, unspecified: Secondary | ICD-10-CM | POA: Diagnosis not present

## 2021-05-15 DIAGNOSIS — N200 Calculus of kidney: Secondary | ICD-10-CM | POA: Diagnosis not present

## 2021-05-15 DIAGNOSIS — Z87891 Personal history of nicotine dependence: Secondary | ICD-10-CM | POA: Diagnosis not present

## 2021-05-15 DIAGNOSIS — D472 Monoclonal gammopathy: Secondary | ICD-10-CM | POA: Diagnosis not present

## 2021-05-15 DIAGNOSIS — N183 Chronic kidney disease, stage 3 unspecified: Secondary | ICD-10-CM | POA: Diagnosis not present

## 2021-05-15 DIAGNOSIS — E7800 Pure hypercholesterolemia, unspecified: Secondary | ICD-10-CM | POA: Diagnosis not present

## 2021-05-15 DIAGNOSIS — Z9010 Acquired absence of unspecified breast and nipple: Secondary | ICD-10-CM | POA: Diagnosis not present

## 2021-05-15 DIAGNOSIS — Z9049 Acquired absence of other specified parts of digestive tract: Secondary | ICD-10-CM | POA: Diagnosis not present

## 2021-05-15 DIAGNOSIS — Z8249 Family history of ischemic heart disease and other diseases of the circulatory system: Secondary | ICD-10-CM | POA: Diagnosis not present

## 2021-05-15 DIAGNOSIS — Z79899 Other long term (current) drug therapy: Secondary | ICD-10-CM | POA: Diagnosis not present

## 2021-05-15 DIAGNOSIS — C9000 Multiple myeloma not having achieved remission: Secondary | ICD-10-CM | POA: Diagnosis not present

## 2021-05-15 DIAGNOSIS — R809 Proteinuria, unspecified: Secondary | ICD-10-CM | POA: Diagnosis not present

## 2021-05-15 DIAGNOSIS — Z885 Allergy status to narcotic agent status: Secondary | ICD-10-CM | POA: Diagnosis not present

## 2021-05-15 DIAGNOSIS — N049 Nephrotic syndrome with unspecified morphologic changes: Secondary | ICD-10-CM | POA: Diagnosis not present

## 2021-05-15 NOTE — Progress Notes (Signed)
Pharmacist Chemotherapy Monitoring - Initial Assessment    Anticipated start date: 05/22/21   The following has been reviewed per standard work regarding the patient's treatment regimen: The patient's diagnosis, treatment plan and drug doses, and organ/hematologic function Lab orders and baseline tests specific to treatment regimen  The treatment plan start date, drug sequencing, and pre-medications Prior authorization status  Patient's documented medication list, including drug-drug interaction screen and prescriptions for anti-emetics and supportive care specific to the treatment regimen The drug concentrations, fluid compatibility, administration routes, and timing of the medications to be used The patient's access for treatment and lifetime cumulative dose history, if applicable  The patient's medication allergies and previous infusion related reactions, if applicable   Changes made to treatment plan:  treatment plan date  Follow up needed:  F/u renal biopsy and decision to start tx for myeloma.    Kennith Center, Pharm.D., CPP 05/15/2021@2 :17 PM

## 2021-05-16 NOTE — Telephone Encounter (Signed)
Oral Oncology Patient Advocate Encounter  Prior Authorization for Revlimid has been approved.    PA# [id number] Effective dates: 04/20/21 through 10/17/21   Oral Oncology Clinic will continue to follow.   [place] Patient [place] Phone [phone] Fax [phone] 05/16/2021 9:33 AM

## 2021-05-17 ENCOUNTER — Ambulatory Visit: Payer: Medicare Other

## 2021-05-17 DIAGNOSIS — E785 Hyperlipidemia, unspecified: Secondary | ICD-10-CM | POA: Diagnosis not present

## 2021-05-22 ENCOUNTER — Other Ambulatory Visit: Payer: Medicare Other

## 2021-05-22 ENCOUNTER — Ambulatory Visit: Payer: Medicare Other

## 2021-05-22 ENCOUNTER — Ambulatory Visit: Payer: Medicare Other | Admitting: Hematology

## 2021-05-25 ENCOUNTER — Ambulatory Visit: Payer: Medicare Other

## 2021-05-29 ENCOUNTER — Ambulatory Visit: Payer: Medicare Other

## 2021-05-29 ENCOUNTER — Ambulatory Visit: Payer: Medicare Other | Admitting: Hematology

## 2021-05-29 ENCOUNTER — Other Ambulatory Visit: Payer: Medicare Other

## 2021-06-01 ENCOUNTER — Ambulatory Visit: Payer: Medicare Other

## 2021-06-01 ENCOUNTER — Ambulatory Visit: Payer: Medicare Other | Admitting: Cardiology

## 2021-06-01 DIAGNOSIS — N183 Chronic kidney disease, stage 3 unspecified: Secondary | ICD-10-CM | POA: Diagnosis not present

## 2021-06-01 DIAGNOSIS — Z6821 Body mass index (BMI) 21.0-21.9, adult: Secondary | ICD-10-CM | POA: Diagnosis not present

## 2021-06-01 DIAGNOSIS — D631 Anemia in chronic kidney disease: Secondary | ICD-10-CM | POA: Diagnosis not present

## 2021-06-01 DIAGNOSIS — D8989 Other specified disorders involving the immune mechanism, not elsewhere classified: Secondary | ICD-10-CM | POA: Diagnosis not present

## 2021-06-01 DIAGNOSIS — E8581 Light chain (AL) amyloidosis: Secondary | ICD-10-CM | POA: Diagnosis not present

## 2021-06-01 DIAGNOSIS — Z885 Allergy status to narcotic agent status: Secondary | ICD-10-CM | POA: Diagnosis not present

## 2021-06-01 DIAGNOSIS — C9000 Multiple myeloma not having achieved remission: Secondary | ICD-10-CM | POA: Diagnosis not present

## 2021-06-15 ENCOUNTER — Ambulatory Visit: Payer: Medicare Other | Admitting: Cardiology

## 2021-06-21 ENCOUNTER — Other Ambulatory Visit: Payer: Self-pay | Admitting: Hematology

## 2021-06-21 ENCOUNTER — Encounter: Payer: Self-pay | Admitting: Hematology

## 2021-06-21 ENCOUNTER — Telehealth: Payer: Self-pay | Admitting: Pharmacist

## 2021-06-21 ENCOUNTER — Other Ambulatory Visit (HOSPITAL_COMMUNITY): Payer: Self-pay

## 2021-06-21 ENCOUNTER — Telehealth: Payer: Self-pay

## 2021-06-21 DIAGNOSIS — D8989 Other specified disorders involving the immune mechanism, not elsewhere classified: Secondary | ICD-10-CM | POA: Insufficient documentation

## 2021-06-21 DIAGNOSIS — Z7189 Other specified counseling: Secondary | ICD-10-CM

## 2021-06-21 MED ORDER — CYCLOPHOSPHAMIDE 50 MG PO CAPS
300.0000 mg/m2 | ORAL_CAPSULE | ORAL | 2 refills | Status: DC
  Filled 2021-06-21 – 2021-06-22 (×2): qty 40, 28d supply, fill #0

## 2021-06-21 MED ORDER — PROCHLORPERAZINE MALEATE 10 MG PO TABS: 10.0000 mg | ORAL_TABLET | Freq: Four times a day (QID) | ORAL | 1 refills | Status: DC | PRN

## 2021-06-21 MED ORDER — LORAZEPAM 0.5 MG PO TABS: 0.5000 mg | ORAL_TABLET | Freq: Four times a day (QID) | ORAL | 0 refills | Status: DC | PRN

## 2021-06-21 MED ORDER — ACYCLOVIR 400 MG PO TABS: 400.0000 mg | ORAL_TABLET | Freq: Two times a day (BID) | ORAL | 5 refills | Status: DC

## 2021-06-21 MED ORDER — ONDANSETRON HCL 8 MG PO TABS: ORAL_TABLET | ORAL | 1 refills | Status: DC

## 2021-06-21 NOTE — Progress Notes (Signed)
DISCONTINUE ON PATHWAY REGIMEN - Multiple Myeloma and Other Plasma Cell Dyscrasias     A cycle is every 21 days:     Bortezomib      Lenalidomide      Dexamethasone   **Always confirm dose/schedule in your pharmacy ordering system**  REASON: Other Reason PRIOR TREATMENT: [id number]: VRd (Bortezomib 1.3 mg/m2 SUBQ D1, 4, 8, 11 + Lenalidomide 25 mg + Dexamethasone 20 mg) q21 Days x 4-6 Cycles Maximum Prior to Stem Cell Harvest Concurrent with Referral to Transplant Service TREATMENT RESPONSE: Unable to Evaluate  START ON PATHWAY REGIMEN - Multiple Myeloma and Other Plasma Cell Dyscrasias     Cycles 1 and 2: A cycle is every 28 days:     Cyclophosphamide      Dexamethasone      Daratumumab and hyaluronidase-fihj      Bortezomib    Cycles 3 through 6: A cycle is every 28 days:     Cyclophosphamide      Dexamethasone      Dexamethasone      Daratumumab and hyaluronidase-fihj      Bortezomib    Cycles 7 and beyond (up to 2 years): A cycle is every 28 days:     Daratumumab and hyaluronidase-fihj   **Always confirm dose/schedule in your pharmacy ordering system**  Patient Characteristics: Primary AL Amyloidosis, First Line, Eligible for Transplant Disease Classification: Primary AL Amyloidosis Line of therapy: First Line Transplant Eligibility: Eligible for Transplant Intent of Therapy: Non-Curative / Palliative Intent, Discussed with Patient

## 2021-06-21 NOTE — Telephone Encounter (Signed)
Oral Oncology Pharmacist Encounter  Received new prescription for Cytoxan (cyclophosphamide) for the treatment of systemic lamda light chain amyloidosis in conjunction with bortezomib and dexamethasone. Daratumumab may be added in subsequent cycles if inadequate response. Planned duration until disease progression or unacceptable drug toxicity.  CBC w/ Diff and CMP from 06/01/21 assessed, Scr 1.12 mg/dL (CrCl ~39.5 mL/min). Prescription dose and frequency assessed for appropriateness. Appropriate for therapy initiation.   Current medication list in Epic reviewed, no relevant/significant DDIs with Cytoxan identified.  Evaluated chart and no patient barriers to medication adherence noted.   Prescription has been e-scribed to the [facility] for benefits analysis and approval.  Oral Oncology Clinic will continue to follow for insurance authorization, copayment issues, initial counseling and start date.  Leron Croak, PharmD, BCPS Hematology/Oncology Clinical Pharmacist [facility] and [place] [phone] 06/21/2021 11:01 AM

## 2021-06-21 NOTE — Telephone Encounter (Signed)
Oral Oncology Patient Advocate Encounter  After completing a benefits investigation, prior authorization for Cytoxan is not required at this time through Medicare B.  Patient's copay is $15.57.    [place] Patient [place] Phone [phone] Fax [phone] 06/21/2021 10:58 AM

## 2021-06-22 ENCOUNTER — Other Ambulatory Visit (HOSPITAL_COMMUNITY): Payer: Self-pay

## 2021-06-22 NOTE — Telephone Encounter (Signed)
Oral Chemotherapy Pharmacist Encounter   Spoke with patient today to follow up regarding patient's oral chemotherapy medication: Cytoxan (cyclophosphamide)  Patient request that she pick this up from the [facility] on 06/26/21. Patient request call back on Monday, 06/25/21 to discuss medication in further detail.  Leron Croak, PharmD, BCPS Hematology/Oncology Clinical Pharmacist [facility] and [place] [phone] 06/22/2021 11:08 AM

## 2021-06-23 NOTE — Progress Notes (Signed)
Cardiology Office Note   Date:  06/28/2021   ID:  Faiga, Stones 1951/03/24, MRN [id number]  PCP:  Ginger Organ., MD  Cardiologist:   Minus Breeding, MD   Chief Complaint  Patient presents with   Cardiomyopathy      History of Present Illness: Traci [name] is a 71 y.o. female who presents for evaluation of hypertrophic cardiomyopathy.    The MRI is consistent with moderate basal septal hypertrophy.  She is now being evaluated for possible length chain deposition disease.  Diagnosed with multiple myeloma.   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     She has been doing very well.  The patient denies any new symptoms such as chest discomfort, neck or arm discomfort. There has been no new shortness of breath, PND or orthopnea. There have been no reported palpitations, presyncope or syncope.  She is still exercising routinely.     Past Medical History:  Diagnosis Date   ASHD (arteriosclerotic heart disease)    Mild septal hypertrophy, 2D ECHO 11/2006, Dr. Percival Spanish   Breast cancer [facility])    PMH of, Dr. Marylene Buerger, Dr. Truddie Coco  multilpe sites overlaping, ER +   Facet syndrome, lumbar    Dr. Lynann Bologna   Heart murmur    no problems   History of COVID-19 05/07/2019   Hyperlipidemia    Hyperlipidemia    Hypertension    Osteopenia    Skin cancer, basal cell    Dr. Amy Martinique    Past Surgical History:  Procedure Laterality Date   CATARACT EXTRACTION Right 06/2020   CHOLECYSTECTOMY  about 1993   COLONOSCOPY      X 3 negative (last 2008, due 2015), Dr. Olevia Perches   MASTECTOMY Left 11/2004   Dr. Marylene Buerger, also reduction of right breast and reconstruction on the left.   ROTATOR CUFF REPAIR Left    TONSILLECTOMY     TRANSESOPHAGEAL ECHOCARDIOGRAM  2012   Overall left ventricular systolic function was normal. Left ventricular ejection fraction was estimated, range being 55% to 60%. There were no left ventricular regional wall motional abnormalities. Left ventricular wall  thickness was mildly increased. There was mild focal basal septal hypertrophy.   WISDOM TOOTH EXTRACTION       Current Outpatient Medications  Medication Sig Dispense Refill   amLODipine (NORVASC) 10 MG tablet Take 1 tablet (10 mg total) by mouth daily. 30 tablet 0   benazepril (LOTENSIN) 40 MG tablet Take 1 tablet (40 mg total) by mouth daily. 30 tablet 0   denosumab (PROLIA) 60 MG/ML SOLN injection Inject 60 mg into the skin every 6 (six) months. Administer in upper arm, thigh, or abdomen     iron polysaccharides (NIFEREX) 150 MG capsule Take 1 capsule (150 mg total) by mouth daily. 30 capsule 2   LORazepam (ATIVAN) 0.5 MG tablet Take 1 tablet (0.5 mg total) by mouth every 6 (six) hours as needed (Nausea or vomiting). 30 tablet 0   ondansetron (ZOFRAN) 8 MG tablet Take 8 mg by mouth 30 to 60 min prior to Cytoxan administration then take 8 mg twice daily as needed for nausea and vomiting. 30 tablet 1   prochlorperazine (COMPAZINE) 10 MG tablet Take 1 tablet (10 mg total) by mouth every 6 (six) hours as needed (Nausea or vomiting). 30 tablet 1   rosuvastatin (CRESTOR) 20 MG tablet 1 tablet     acyclovir (ZOVIRAX) 400 MG tablet Take 1 tablet (400 mg total)  by mouth 2 (two) times daily. 30 tablet 6   cyclophosphamide (CYTOXAN) 50 MG capsule Take 10 capsules (500 mg total) by mouth once a week. Take with food to minimize GI upset. Take early in the day and maintain hydration. (Bring to cancer center and take with Velcade injection after nausea medications). 40 capsule 2   No current facility-administered medications for this visit.    Allergies:   Shrimp [shellfish allergy], Hydrocodone, and Vicodin [hydrocodone-acetaminophen]    ROS:  Please see the history of present illness.   Otherwise, review of systems are positive for none.   All other systems are reviewed and negative.    PHYSICAL EXAM: VS:  BP 128/82    Pulse 60    Ht _0  (1.6 m)    Wt 122 lb 12.8 oz (55.7 kg)    LMP 05/07/1999  (LMP Unknown)    SpO2 98%    BMI 21.75 kg/m  , BMI Body mass index is 21.75 kg/m. GENERAL:  Well appearing HEENT:  Pupils equal round and reactive, fundi not visualized, oral mucosa unremarkable NECK:  No jugular venous distention, waveform within normal limits, carotid upstroke brisk and symmetric, no bruits, no thyromegaly LUNGS:  Clear to auscultation BACK:  No CVA tenderness CHEST:  Unremarkable HEART:  PMI not displaced or sustained,S1 and S2 within normal limits, no S3, no S4, no clicks, no rubs, 2 out of 6 apical systolic murmur radiating slightly at aortic outflow tract, no diastolic murmurs ABD:  Flat, positive bowel sounds normal in frequency in pitch, no bruits, no rebound, no guarding, no midline pulsatile mass, no hepatomegaly, no splenomegaly EXT:  2 plus pulses throughout, no edema, no cyanosis no clubbing   EKG:  EKG is ordered today. The ekg ordered today demonstrates sinus rhythm, rate 60, axis within normal limits, intervals within normal limits, no acute ST-T wave changes.   Recent Labs: 06/26/2021: ALT 19; BUN 28; Creatinine 1.32; Hemoglobin 11.2; Platelet Count 235; Potassium 3.8; Sodium 134    Lipid Panel    Component Value Date/Time   CHOL 191 03/14/2014 1532   CHOL 195 03/14/2014 1424   TRIG 62 03/14/2014 1532   TRIG 46.0 03/14/2014 1424   HDL 81 03/14/2014 1532   HDL 69.90 03/14/2014 1424   CHOLHDL 3 03/14/2014 1424   VLDL 9.2 03/14/2014 1424   LDLCALC 98 03/14/2014 1532   LDLCALC 116 (H) 03/14/2014 1424   LDLDIRECT 116.9 12/21/2012 0735      Wt Readings from Last 3 Encounters:  06/26/21 120 lb 9.6 oz (54.7 kg)  06/25/21 122 lb 12.8 oz (55.7 kg)  03/20/21 121 lb 8 oz (55.1 kg)      Other studies Reviewed: Additional studies/ records that were reviewed today include: [facility] records. Review of the above records demonstrates:  Please see elsewhere in the note.     ASSESSMENT AND PLAN:  HYPERTROPHIC CARDIOMYOPATHY -  I will follow up with an  echo.   Of note, we did discuss briefly that LCDD can be associated with non amyloid light chain depositions in organs including the heart.  She will discuss this with her [place].    HYPERTENSION - The blood pressure is at target.  No change in therapy.   Current medicines are reviewed at length with the patient today.  The patient does not have concerns regarding medicines.  The following changes have been made:  no change  Labs/ tests ordered today include:   Orders Placed This Encounter  Procedures  EKG 12-Lead   ECHOCARDIOGRAM COMPLETE     Disposition:   FU with in one year or sooner if needed.      Signed, Minus Breeding, MD  06/28/2021 1:24 PM    [facility]

## 2021-06-25 ENCOUNTER — Other Ambulatory Visit: Payer: Self-pay

## 2021-06-25 ENCOUNTER — Encounter: Payer: Self-pay | Admitting: Cardiology

## 2021-06-25 ENCOUNTER — Ambulatory Visit (INDEPENDENT_AMBULATORY_CARE_PROVIDER_SITE_OTHER): Payer: Medicare Other | Admitting: Cardiology

## 2021-06-25 ENCOUNTER — Other Ambulatory Visit (HOSPITAL_COMMUNITY): Payer: Self-pay

## 2021-06-25 VITALS — BP 128/82 | HR 60 | Ht 63.0 in | Wt 122.8 lb

## 2021-06-25 DIAGNOSIS — I10 Essential (primary) hypertension: Secondary | ICD-10-CM | POA: Diagnosis not present

## 2021-06-25 DIAGNOSIS — E785 Hyperlipidemia, unspecified: Secondary | ICD-10-CM | POA: Diagnosis not present

## 2021-06-25 DIAGNOSIS — I422 Other hypertrophic cardiomyopathy: Secondary | ICD-10-CM

## 2021-06-25 DIAGNOSIS — C9000 Multiple myeloma not having achieved remission: Secondary | ICD-10-CM

## 2021-06-25 MED ORDER — CYCLOPHOSPHAMIDE 50 MG PO CAPS
300.0000 mg/m2 | ORAL_CAPSULE | ORAL | 2 refills | Status: DC
  Filled 2021-06-25 (×2): qty 40, 28d supply, fill #0
  Filled 2021-07-13: qty 40, 28d supply, fill #1
  Filled 2021-08-16: qty 40, 28d supply, fill #2

## 2021-06-25 NOTE — Telephone Encounter (Signed)
Oral Chemotherapy Pharmacist Encounter  I spoke with patient for overview of: cyclophosphamide for the treatment of systemic lamda light chain amyloidosis in conjunction with bortezomib and dexamethasone. Daratumumab may be added in subsequent cycles if inadequate response. Planned duration until disease progression or unacceptable drug toxicity.  Counseled patient on administration, dosing, side effects, monitoring, drug-food interactions, safe handling, storage, and disposal.  Patient will take cyclophosphamide 50mg  capsules, 10 capsules (500mg ) by mouth once weekly, to be given on the days of Velcade injection.  Patient will take cyclophosphamide without regard to food, however, administration with food may decrease GI upset. Patient instructed to take cyclophosphamide early in the day and to maintain adequate hydration to prevent bladder toxicity.  Cyclophosphamide start date: pending start day of therapy. MD appt scheduled for 06/26/21 along with patient education.    Adverse effects include but are not limited to: decreased blood counts, nausea, vomiting, diarrhea, hair loss, and hemorrhagic cystitis.    Reviewed with patient importance of keeping a medication schedule and plan for any missed doses. No barriers to medication adherence identified.  Medication reconciliation performed and medication/allergy list updated. Discussed with patient about picking up acyclovir prescription from [facility] on [address] for VZV PPX for bortezomib.   Insurance authorization for cyclophosphamide has been obtained. Patient will pick this up from the [place] on 06/26/21.  Patient informed the pharmacy will reach out 5-7 days prior to needing next fill of cyclophosphamide to coordinate continued medication acquisition to prevent break in therapy.  All questions answered.  [name] voiced understanding and appreciation.   Medication education handout placed in mail for patient.  Patient knows to call the office with questions or concerns. Oral Chemotherapy Clinic phone number provided to patient.   Leron Croak, PharmD, BCPS Hematology/Oncology Clinical Pharmacist [facility] [place] [phone] 06/25/2021 2:46 PM

## 2021-06-25 NOTE — Patient Instructions (Signed)
Medication Instructions:  Your physician recommends that you continue on your current medications as directed. Please refer to the Current Medication list given to you today.  *If you need a refill on your cardiac medications before your next appointment, please call your pharmacy*   Lab Work: NONE ordered at this time of appointment   If you have labs (blood work) drawn today and your tests are completely normal, you will receive your results only by: [place] (if you have MyChart) OR A paper copy in the mail If you have any lab test that is abnormal or we need to change your treatment, we will call you to review the results.  Testing/Procedures: Your physician has requested that you have an echocardiogram. Echocardiography is a painless test that uses sound waves to create images of your heart. It provides your doctor with information about the size and shape of your heart and how well your hearts chambers and valves are working. This procedure takes approximately one hour. There are no restrictions for this procedure.  Follow-Up: At [facility], you and your health needs are our priority.  As part of our continuing mission to provide you with exceptional heart care, we have created designated Provider Care Teams.  These Care Teams include your primary Cardiologist (physician) and Advanced Practice Providers (APPs -  Physician Assistants and Nurse Practitioners) who all work together to provide you with the care you need, when you need it.  Your next appointment:   1 year(s)  The format for your next appointment:   In Person  Provider:   Minus Breeding, MD    Other Instructions

## 2021-06-26 ENCOUNTER — Inpatient Hospital Stay: Payer: Medicare Other

## 2021-06-26 ENCOUNTER — Inpatient Hospital Stay: Payer: Medicare Other | Attending: Hematology | Admitting: Hematology

## 2021-06-26 ENCOUNTER — Other Ambulatory Visit (HOSPITAL_COMMUNITY): Payer: Self-pay

## 2021-06-26 ENCOUNTER — Other Ambulatory Visit: Payer: Self-pay | Admitting: Hematology

## 2021-06-26 ENCOUNTER — Encounter: Payer: Self-pay | Admitting: Hematology

## 2021-06-26 VITALS — BP 129/80 | HR 70 | Temp 97.7°F | Resp 18 | Wt 120.6 lb

## 2021-06-26 DIAGNOSIS — Z801 Family history of malignant neoplasm of trachea, bronchus and lung: Secondary | ICD-10-CM | POA: Insufficient documentation

## 2021-06-26 DIAGNOSIS — D8989 Other specified disorders involving the immune mechanism, not elsewhere classified: Secondary | ICD-10-CM

## 2021-06-26 DIAGNOSIS — Z833 Family history of diabetes mellitus: Secondary | ICD-10-CM | POA: Insufficient documentation

## 2021-06-26 DIAGNOSIS — Z5112 Encounter for antineoplastic immunotherapy: Secondary | ICD-10-CM | POA: Diagnosis not present

## 2021-06-26 DIAGNOSIS — Z87891 Personal history of nicotine dependence: Secondary | ICD-10-CM | POA: Diagnosis not present

## 2021-06-26 DIAGNOSIS — D472 Monoclonal gammopathy: Secondary | ICD-10-CM | POA: Diagnosis not present

## 2021-06-26 DIAGNOSIS — Z800 Family history of malignant neoplasm of digestive organs: Secondary | ICD-10-CM | POA: Insufficient documentation

## 2021-06-26 DIAGNOSIS — Z79899 Other long term (current) drug therapy: Secondary | ICD-10-CM | POA: Diagnosis not present

## 2021-06-26 DIAGNOSIS — Z8249 Family history of ischemic heart disease and other diseases of the circulatory system: Secondary | ICD-10-CM | POA: Insufficient documentation

## 2021-06-26 DIAGNOSIS — N189 Chronic kidney disease, unspecified: Secondary | ICD-10-CM | POA: Diagnosis not present

## 2021-06-26 DIAGNOSIS — Z8042 Family history of malignant neoplasm of prostate: Secondary | ICD-10-CM | POA: Insufficient documentation

## 2021-06-26 DIAGNOSIS — I129 Hypertensive chronic kidney disease with stage 1 through stage 4 chronic kidney disease, or unspecified chronic kidney disease: Secondary | ICD-10-CM | POA: Insufficient documentation

## 2021-06-26 DIAGNOSIS — C9000 Multiple myeloma not having achieved remission: Secondary | ICD-10-CM | POA: Diagnosis not present

## 2021-06-26 DIAGNOSIS — D649 Anemia, unspecified: Secondary | ICD-10-CM | POA: Diagnosis not present

## 2021-06-26 DIAGNOSIS — Z5111 Encounter for antineoplastic chemotherapy: Secondary | ICD-10-CM

## 2021-06-26 DIAGNOSIS — R803 Bence Jones proteinuria: Secondary | ICD-10-CM | POA: Insufficient documentation

## 2021-06-26 LAB — CMP (CANCER CENTER ONLY)
ALT: 19 U/L (ref 0–44)
AST: 28 U/L (ref 15–41)
Albumin: 4.6 g/dL (ref 3.5–5.0)
Alkaline Phosphatase: 38 U/L (ref 38–126)
Anion gap: 9 (ref 5–15)
BUN: 28 mg/dL — ABNORMAL HIGH (ref 8–23)
CO2: 24 mmol/L (ref 22–32)
Calcium: 9.5 mg/dL (ref 8.9–10.3)
Chloride: 101 mmol/L (ref 98–111)
Creatinine: 1.32 mg/dL — ABNORMAL HIGH (ref 0.44–1.00)
GFR, Estimated: 43 mL/min — ABNORMAL LOW (ref >60)
Glucose, Bld: 96 mg/dL (ref 70–99)
Potassium: 3.8 mmol/L (ref 3.5–5.1)
Sodium: 134 mmol/L — ABNORMAL LOW (ref 135–145)
Total Bilirubin: 0.5 mg/dL (ref 0.3–1.2)
Total Protein: 7.1 g/dL (ref 6.5–8.1)

## 2021-06-26 LAB — CBC WITH DIFFERENTIAL (CANCER CENTER ONLY)
Abs Immature Granulocytes: 0.01 10*3/uL (ref 0.00–0.07)
Basophils Absolute: 0.1 10*3/uL (ref 0.0–0.1)
Basophils Relative: 1 %
Eosinophils Absolute: 0.2 10*3/uL (ref 0.0–0.5)
Eosinophils Relative: 3 %
HCT: 34.1 % — ABNORMAL LOW (ref 36.0–46.0)
Hemoglobin: 11.2 g/dL — ABNORMAL LOW (ref 12.0–15.0)
Immature Granulocytes: 0 %
Lymphocytes Relative: 30 %
Lymphs Abs: 1.6 10*3/uL (ref 0.7–4.0)
MCH: 30.6 pg (ref 26.0–34.0)
MCHC: 32.8 g/dL (ref 30.0–36.0)
MCV: 93.2 fL (ref 80.0–100.0)
Monocytes Absolute: 0.3 10*3/uL (ref 0.1–1.0)
Monocytes Relative: 7 %
Neutro Abs: 3 10*3/uL (ref 1.7–7.7)
Neutrophils Relative %: 59 %
Platelet Count: 235 10*3/uL (ref 150–400)
RBC: 3.66 MIL/uL — ABNORMAL LOW (ref 3.87–5.11)
RDW: 13.5 % (ref 11.5–15.5)
WBC Count: 5.1 10*3/uL (ref 4.0–10.5)
nRBC: 0 % (ref 0.0–0.2)

## 2021-06-26 LAB — IRON AND IRON BINDING CAPACITY (CC-WL,HP ONLY)
Iron: 75 ug/dL (ref 28–170)
Saturation Ratios: 16 % (ref 10.4–31.8)
TIBC: 466 ug/dL — ABNORMAL HIGH (ref 250–450)
UIBC: 391 ug/dL (ref 148–442)

## 2021-06-26 LAB — LACTATE DEHYDROGENASE: LDH: 180 U/L (ref 98–192)

## 2021-06-26 LAB — SEDIMENTATION RATE: Sed Rate: 10 mm/hr (ref 0–22)

## 2021-06-26 LAB — FERRITIN: Ferritin: 40 ng/mL (ref 11–307)

## 2021-06-26 MED ORDER — ACYCLOVIR 400 MG PO TABS: 400.0000 mg | ORAL_TABLET | Freq: Two times a day (BID) | ORAL | 6 refills | Status: DC

## 2021-06-26 NOTE — Progress Notes (Signed)
[name]   HEMATOLOGY/ONCOLOGY CLINIC NOTE  Date of Service: .04/19/2021   Patient Care Team: Ginger Organ., MD as PCP - General (Internal Medicine)  CHIEF COMPLAINTS/PURPOSE OF CONSULTATION:  Follow-up for evaluation and management of smoldering multiple myeloma with renal LCDD  HISTORY OF PRESENTING ILLNESS:   Traci [name] is a wonderful 71 y.o. 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  Patient had work-up for mild anemia and progressive chronic kidney disease and was noted to have Bence-Jones proteinuria on a random urine sample.  This iwas not quantifiable.  Patient notes she has lost about 30 pounds after having COVID-19 infection in January 2021 but has since gained back some of that weight and is eating better. Meuth no focal bone pains , no new hypercalcemia.  Stable anemia.  Recent bone survey that did not show any obvious metastatic lesions..  We discussed the importance and diagnostic possibilities in the context of Bence-Jones proteinuria including the possibility of multiple myeloma or lymphomas.  INTERVAL HISTORY Traci [name] is here for follow-up to discuss the evaluation for plasma cell dyscrasia.   She reports she is feeling very well overall, no new symptoms.  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 We discussed the effect this has on her kidneys. We had a long discussion regarding treatment.   MEDICAL HISTORY:  Past Medical History:  Diagnosis Date   ASHD (arteriosclerotic heart disease)    Mild septal hypertrophy, 2D ECHO 11/2006, Dr. Percival Spanish   Breast cancer [facility])    PMH of, Dr. Marylene Buerger, Dr. Truddie Coco  multilpe sites overlaping, ER +   Facet syndrome, lumbar    Dr. Lynann Bologna   Heart murmur    no problems   History of COVID-19 05/07/2019   Hyperlipidemia    Hyperlipidemia    Hypertension     Osteopenia    Skin cancer, basal cell    Dr. Amy Martinique     SURGICAL HISTORY: Past Surgical History:  Procedure Laterality Date   CATARACT EXTRACTION Right 06/2020   CHOLECYSTECTOMY  about 1993   COLONOSCOPY      X 3 negative (last 2008, due 2015), Dr. Olevia Perches   MASTECTOMY Left 11/2004   Dr. Marylene Buerger, also reduction of right breast and reconstruction on the left.   ROTATOR CUFF REPAIR Left    TONSILLECTOMY     TRANSESOPHAGEAL ECHOCARDIOGRAM  2012   Overall left ventricular systolic function was normal. Left ventricular ejection fraction was estimated, range being 55% to 60%. There were no left ventricular regional wall motional abnormalities. Left ventricular wall thickness was mildly increased. There was mild focal basal septal hypertrophy.   WISDOM TOOTH EXTRACTION      SOCIAL HISTORY: Social History   Socioeconomic History   Marital status: Married    Spouse name: Not on file   Number of children: Not on file   Years of education: Not on file   Highest education level: Not on file  Occupational History   Occupation: Retired    Fish farm manager: UNEMPLOYED  Tobacco Use   Smoking status: Former    Packs/day: 1.00    Years: 10.00    Pack years: 10.00    Types: Cigarettes    Quit date: 05/06/1978    Years since quitting: 43.0   Smokeless tobacco: Never   Tobacco comments:  smoked [place], up to 1 ppd  Vaping Use   Vaping Use: Never used  Substance and Sexual Activity   Alcohol use: Yes    Alcohol/week: 4.0 standard drinks    Types: 4 Glasses of wine per week   Drug use: No   Sexual activity: Yes    Partners: Male    Birth control/protection: Post-menopausal  Other Topics Concern   Not on file  Social History Narrative   Married   Regular exercise: Yes, high level CVE, walks 2 mpd 5x/ week & gym   No specific diet   Social Determinants of Health   Financial Resource Strain: Not on file  Food Insecurity: Not on file  Transportation Needs: Not on file   Physical Activity: Not on file  Stress: Not on file  Social Connections: Not on file  Intimate Partner Violence: Not on file    FAMILY HISTORY: Family History  Problem Relation Age of Onset   Hypertension Mother    Coronary artery disease Mother        no MI   Lung cancer Mother        smoker   Colon cancer Father 82   Coronary artery disease Father        no MI   Prostate cancer Father    Diabetes Father    Hypertension Sister    Cancer Maternal Grandfather        GI   Diabetes Paternal Grandfather    Stroke Neg Hx    Esophageal cancer Neg Hx    Stomach cancer Neg Hx    Rectal cancer Neg Hx     ALLERGIES:  is allergic to shrimp [shellfish allergy], hydrocodone, and vicodin [hydrocodone-acetaminophen].  MEDICATIONS:  Current Outpatient Medications  Medication Sig Dispense Refill   acyclovir (ZOVIRAX) 400 MG tablet Take 1 tablet (400 mg total) by mouth 2 (two) times daily. 60 tablet 3   amLODipine (NORVASC) 10 MG tablet Take 1 tablet (10 mg total) by mouth daily. 30 tablet 0   amLODipine (NORVASC) 10 MG tablet 1 tablet     benazepril (LOTENSIN) 40 MG tablet Take 1 tablet (40 mg total) by mouth daily. 30 tablet 0   benazepril (LOTENSIN) 40 MG tablet 1 tablet     denosumab (PROLIA) 60 MG/ML SOLN injection Inject 60 mg into the skin every 6 (six) months. Administer in upper arm, thigh, or abdomen     iron polysaccharides (NIFEREX) 150 MG capsule Take 1 capsule (150 mg total) by mouth daily. 30 capsule 2   lenalidomide (REVLIMID) 10 MG capsule Take 1 capsule (10 mg total) by mouth daily. Take 14 days on, 7 days off, repeat every 21 days. 14 capsule 0   LORazepam (ATIVAN) 0.5 MG tablet Take 1 tablet (0.5 mg total) by mouth every 6 (six) hours as needed (Nausea or vomiting). 30 tablet 0   ondansetron (ZOFRAN) 8 MG tablet Take 1 tablet (8 mg total) by mouth 2 (two) times daily as needed (Nausea or vomiting). 30 tablet 1   pravastatin (PRAVACHOL) 20 MG tablet TAKE 1 TABLET (20 MG  TOTAL) BY MOUTH DAILY. (Patient not taking: No sig reported) 90 tablet 3   prochlorperazine (COMPAZINE) 10 MG tablet Take 1 tablet (10 mg total) by mouth every 6 (six) hours as needed (Nausea or vomiting). 30 tablet 1   rosuvastatin (CRESTOR) 20 MG tablet 1 tablet     No current facility-administered medications for this visit.    REVIEW OF SYSTEMS:   .  10 Point review of Systems was done is negative except as noted above.  PHYSICAL EXAMINATION: GENERAL:alert, in no acute distress and comfortable SKIN: no acute rashes, no significant lesions EYES: conjunctiva are pink and non-injected, sclera anicteric OROPHARYNX: MMM, no exudates, no oropharyngeal erythema or ulceration NECK: supple, no JVD LYMPH:  no palpable lymphadenopathy in the cervical, axillary or inguinal regions LUNGS: clear to auscultation b/l with normal respiratory effort HEART: regular rate & rhythm ABDOMEN:  normoactive bowel sounds , non tender, not distended. Extremity: no pedal edema PSYCH: alert & oriented x 3 with fluent speech NEURO: no focal motor/sensory deficits  LABORATORY DATA:  I have reviewed the data as listed  . CBC Latest Ref Rng & Units 04/13/2021 03/12/2021 01/30/2021  WBC 4.0 - 10.5 K/uL 5.2 4.7 5.5  Hemoglobin 12.0 - 15.0 g/dL 11.1(L) 10.6(L) 11.4(L)  Hematocrit 36.0 - 46.0 % 32.8(L) 33.4(L) 34.6(L)  Platelets 150 - 400 K/uL 187 179 214  .CBC    Component Value Date/Time   WBC 5.2 04/13/2021 1023   WBC 4.7 03/12/2021 0730   RBC 3.62 (L) 04/13/2021 1023   HGB 11.1 (L) 04/13/2021 1023   HCT 32.8 (L) 04/13/2021 1023   PLT 187 04/13/2021 1023   MCV 90.6 04/13/2021 1023   MCH 30.7 04/13/2021 1023   MCHC 33.8 04/13/2021 1023   RDW 13.4 04/13/2021 1023   LYMPHSABS 1.7 04/13/2021 1023   MONOABS 0.3 04/13/2021 1023   EOSABS 0.2 04/13/2021 1023   BASOSABS 0.1 04/13/2021 1023    CMP Latest Ref Rng & Units 04/13/2021 01/30/2021 07/31/2020  Glucose 70 - 99 mg/dL 98 94 -  BUN 8 - 23 mg/dL 39(H)  29(H) -  Creatinine 0.44 - 1.00 mg/dL 1.52(H) 1.19(H) 1.20(H)  Sodium 135 - 145 mmol/L 138 142 -  Potassium 3.5 - 5.1 mmol/L 3.8 3.7 -  Chloride 98 - 111 mmol/L 105 107 -  CO2 22 - 32 mmol/L 22 23 -  Calcium 8.9 - 10.3 mg/dL 9.5 9.7 -  Total Protein 6.5 - 8.1 g/dL 7.2 7.5 -  Total Bilirubin 0.3 - 1.2 mg/dL 0.5 0.5 -  Alkaline Phos 38 - 126 U/L 43 42 -  AST 15 - 41 U/L 33 30 -  ALT 0 - 44 U/L 22 20 -   Component     Latest Ref Rng & Units 01/30/2021 02/01/2021  Total Protein, Urine-UPE24     Not Estab. mg/dL  12.2  Total Protein, Urine-Ur/day     30 - 150 mg/24 hr  271 (H)  ALBUMIN, U     %  29.1  ALPHA 1 URINE     %  2.5  Alpha 2, Urine     %  6.2  % BETA, Urine     %  27.0  GAMMA GLOBULIN URINE     %  35.2  Free Kappa Lt Chains,Ur     1.17 - 86.46 mg/L  31.61  Free Lambda Lt Chains,Ur     0.27 - 15.21 mg/L  144.52 (H)  Free Kappa/Lambda Ratio     1.83 - 14.26  0.22 (L)  Immunofixation Result, Urine       Comment (A)  Total Volume       2,225  M-SPIKE %, Urine     Not Observed %  19.2 (H)  M-Spike, mg/24 hr     Not Observed mg/24 hr  52 (H)  NOTE:       Comment  IgG (Immunoglobin G), Serum  586 - 1,602 mg/dL 610   IgA     64 - 422 mg/dL 91   IgM (Immunoglobulin M), Srm     26 - 217 mg/dL 79   Total Protein ELP     6.0 - 8.5 g/dL 6.8   Albumin SerPl Elph-Mcnc     2.9 - 4.4 g/dL 4.3   Alpha 1     0.0 - 0.4 g/dL 0.2   Alpha2 Glob SerPl Elph-Mcnc     0.4 - 1.0 g/dL 0.7   B-Globulin SerPl Elph-Mcnc     0.7 - 1.3 g/dL 1.0   Gamma Glob SerPl Elph-Mcnc     0.4 - 1.8 g/dL 0.6   M Protein SerPl Elph-Mcnc     Not Observed g/dL Not Observed   Globulin, Total     2.2 - 3.9 g/dL 2.5   Albumin/Glob SerPl     0.7 - 1.7 1.8 (H)   IFE 1      Comment   Please Note (HCV):      Comment   Iron     41 - 142 ug/dL 65   TIBC     236 - 444 ug/dL 461 (H)   Saturation Ratios     21 - 57 % 14 (L)   UIBC     120 - 384 ug/dL 396 (H)   Kappa free light chain     3.3  - 19.4 mg/L 11.8   Lambda free light chains     5.7 - 26.3 mg/L 480.8 (H)   Kappa, lambda light chain ratio     0.26 - 1.65 0.02 (L)   Ferritin     11 - 307 ng/mL 22   Sed Rate     0 - 22 mm/hr 12   Beta-2 Microglobulin     0.6 - 2.4 mg/L 1.7   LDH     98 - 192 U/L 225 (H)    Surgical Pathology  CASE: [id number]  PATIENT: Traci [name]  Bone Marrow Report      Clinical History: Multiple myeloma , Bence-jones proteinuria  (BH)      DIAGNOSIS:   BONE MARROW, ASPIRATE, CLOT, CORE:  -Normocellular bone marrow with plasma cell neoplasm  -See comment   PERIPHERAL BLOOD:  -Normocytic-normochromic anemia   COMMENT:   The bone marrow is generally normocellular for age with trilineage  hematopoiesis.  In this background, the plasma cells are increased in  number representing 12% of cells associated with small clusters in the  clot and biopsy sections.  The plasma cells display lambda light chain  restriction consistent with plasma cell neoplasm.  Correlation with  cytogenetic and FISH studies is recommended.     RADIOGRAPHIC STUDIES: I have personally reviewed the radiological images as listed and agreed with the findings in the report. No results found.  ASSESSMENT & PLAN:   71 year old female with history of hypertrophic cardiomyopathy, hypertension, dyslipidemia with  #1  Newly diagnosed multiple myeloma  Smoldering with renal LCDD Bone marrow biopsy with 12% lambda restricted plasma cells PET CT scan with no hypermetabolic bone lesions Cytogenetics normal female karyotype Molecular cytogenetics atypical t (11;14) Component     Latest Ref Rng & Units 01/30/2021  IgG (Immunoglobin G), Serum     586 - 1,602 mg/dL 610  IgA     64 - 422 mg/dL 91  IgM (Immunoglobulin M), Srm     26 - 217 mg/dL 79  Total Protein ELP     6.0 -  8.5 g/dL 6.8  Albumin SerPl Elph-Mcnc     2.9 - 4.4 g/dL 4.3  Alpha 1     0.0 - 0.4 g/dL 0.2  Alpha2 Glob SerPl Elph-Mcnc     0.4 -  1.0 g/dL 0.7  B-Globulin SerPl Elph-Mcnc     0.7 - 1.3 g/dL 1.0  Gamma Glob SerPl Elph-Mcnc     0.4 - 1.8 g/dL 0.6  M Protein SerPl Elph-Mcnc     Not Observed g/dL Not Observed  Globulin, Total     2.2 - 3.9 g/dL 2.5  Albumin/Glob SerPl     0.7 - 1.7 1.8 (H)  IFE 1      Comment  Please Note (HCV):      Comment  Kappa free light chain     3.3 - 19.4 mg/L 11.8  Lambda free light chains     5.7 - 26.3 mg/L 480.8 (H)  Kappa, lambda light chain ratio     0.26 - 1.65 0.02 (L)   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  History of hypertension, dyslipidemia #5 normocytic anemia -some element of iron deficiency cannot rule out contribution from plasma cell dyscrasia though at this time anemia does not meet criteria for myeloma. . Lab Results  Component Value Date   IRON 74 04/13/2021   TIBC 413 04/13/2021   IRONPCTSAT 18 (L) 04/13/2021   (Iron and TIBC)  Lab Results  Component Value Date   FERRITIN 47 04/13/2021    Plan -We reviewed pt's renal biopsy from 05/15/2021 showing lambda light chain deposition disease (LCDD)   -We discussed standard treatment options including a triple drug regimen like Velcade Cytoxan and dexamethasone and discussed the indications and side effects. -will add Dara if inadequate response after 3-4 cycles of treatment.  Follow-up -Please schedule the first 2 months of weekly CyBorD treatment with labs -MD visit with cycle 1 day 8 of CyBorD for toxicity check  The total time spent in the appointment was 32 minutes*.  All of the patient's questions were answered with apparent satisfaction. The patient knows to call the clinic with any problems, questions or concerns.   Sullivan Lone MD MS AAHIVMS [facility]  [facility] Hematology/Oncology Physician [facility]  .*Total Encounter Time as defined by the Centers for Medicare and Medicaid Services includes, in addition to the face-to-face time of a patient visit (documented in the note above) non-face-to-face time: obtaining and reviewing outside history, ordering and reviewing medications, tests or procedures, care coordination (communications with other health care professionals or caregivers) and documentation in the medical record.    I, Wilburn Mylar, am acting as scribe for Sullivan Lone, MD.   .I have reviewed the above documentation for accuracy and completeness, and I agree with the above. Brunetta Genera MD

## 2021-06-27 LAB — KAPPA/LAMBDA LIGHT CHAINS
Kappa free light chain: 15.9 mg/L (ref 3.3–19.4)
Kappa, lambda light chain ratio: 0.03 — ABNORMAL LOW (ref 0.26–1.65)
Lambda free light chains: 575.9 mg/L — ABNORMAL HIGH (ref 5.7–26.3)

## 2021-06-28 ENCOUNTER — Encounter: Payer: Self-pay | Admitting: Cardiology

## 2021-06-29 ENCOUNTER — Other Ambulatory Visit: Payer: Self-pay

## 2021-06-29 DIAGNOSIS — C9000 Multiple myeloma not having achieved remission: Secondary | ICD-10-CM

## 2021-06-29 LAB — MULTIPLE MYELOMA PANEL, SERUM
Albumin SerPl Elph-Mcnc: 4.1 g/dL (ref 2.9–4.4)
Albumin/Glob SerPl: 1.7 (ref 0.7–1.7)
Alpha 1: 0.3 g/dL (ref 0.0–0.4)
Alpha2 Glob SerPl Elph-Mcnc: 0.7 g/dL (ref 0.4–1.0)
B-Globulin SerPl Elph-Mcnc: 1 g/dL (ref 0.7–1.3)
Gamma Glob SerPl Elph-Mcnc: 0.6 g/dL (ref 0.4–1.8)
Globulin, Total: 2.5 g/dL (ref 2.2–3.9)
IgA: 85 mg/dL (ref 64–422)
IgG (Immunoglobin G), Serum: 623 mg/dL (ref 586–1602)
IgM (Immunoglobulin M), Srm: 88 mg/dL (ref 26–217)
Total Protein ELP: 6.6 g/dL (ref 6.0–8.5)

## 2021-07-02 ENCOUNTER — Other Ambulatory Visit: Payer: Self-pay | Admitting: Hematology

## 2021-07-02 ENCOUNTER — Other Ambulatory Visit: Payer: Self-pay

## 2021-07-02 ENCOUNTER — Inpatient Hospital Stay: Payer: Medicare Other

## 2021-07-02 ENCOUNTER — Encounter: Payer: Self-pay | Admitting: Hematology

## 2021-07-02 ENCOUNTER — Other Ambulatory Visit (HOSPITAL_COMMUNITY): Payer: Self-pay

## 2021-07-02 ENCOUNTER — Telehealth: Payer: Self-pay | Admitting: Hematology

## 2021-07-02 VITALS — BP 138/77 | HR 65 | Temp 98.0°F | Resp 18

## 2021-07-02 DIAGNOSIS — D8989 Other specified disorders involving the immune mechanism, not elsewhere classified: Secondary | ICD-10-CM

## 2021-07-02 DIAGNOSIS — I129 Hypertensive chronic kidney disease with stage 1 through stage 4 chronic kidney disease, or unspecified chronic kidney disease: Secondary | ICD-10-CM | POA: Diagnosis not present

## 2021-07-02 DIAGNOSIS — Z5112 Encounter for antineoplastic immunotherapy: Secondary | ICD-10-CM | POA: Diagnosis not present

## 2021-07-02 DIAGNOSIS — D472 Monoclonal gammopathy: Secondary | ICD-10-CM | POA: Diagnosis not present

## 2021-07-02 DIAGNOSIS — Z7189 Other specified counseling: Secondary | ICD-10-CM

## 2021-07-02 DIAGNOSIS — R803 Bence Jones proteinuria: Secondary | ICD-10-CM | POA: Diagnosis not present

## 2021-07-02 DIAGNOSIS — N189 Chronic kidney disease, unspecified: Secondary | ICD-10-CM | POA: Diagnosis not present

## 2021-07-02 DIAGNOSIS — C9000 Multiple myeloma not having achieved remission: Secondary | ICD-10-CM

## 2021-07-02 LAB — CBC WITH DIFFERENTIAL (CANCER CENTER ONLY)
Abs Immature Granulocytes: 0.01 10*3/uL (ref 0.00–0.07)
Basophils Absolute: 0.1 10*3/uL (ref 0.0–0.1)
Basophils Relative: 2 %
Eosinophils Absolute: 0.1 10*3/uL (ref 0.0–0.5)
Eosinophils Relative: 2 %
HCT: 32.4 % — ABNORMAL LOW (ref 36.0–46.0)
Hemoglobin: 10.7 g/dL — ABNORMAL LOW (ref 12.0–15.0)
Immature Granulocytes: 0 %
Lymphocytes Relative: 27 %
Lymphs Abs: 1.4 10*3/uL (ref 0.7–4.0)
MCH: 30.6 pg (ref 26.0–34.0)
MCHC: 33 g/dL (ref 30.0–36.0)
MCV: 92.6 fL (ref 80.0–100.0)
Monocytes Absolute: 0.3 10*3/uL (ref 0.1–1.0)
Monocytes Relative: 6 %
Neutro Abs: 3.4 10*3/uL (ref 1.7–7.7)
Neutrophils Relative %: 63 %
Platelet Count: 228 10*3/uL (ref 150–400)
RBC: 3.5 MIL/uL — ABNORMAL LOW (ref 3.87–5.11)
RDW: 13.4 % (ref 11.5–15.5)
WBC Count: 5.3 10*3/uL (ref 4.0–10.5)
nRBC: 0 % (ref 0.0–0.2)

## 2021-07-02 LAB — CMP (CANCER CENTER ONLY)
ALT: 20 U/L (ref 0–44)
AST: 29 U/L (ref 15–41)
Albumin: 4.5 g/dL (ref 3.5–5.0)
Alkaline Phosphatase: 32 U/L — ABNORMAL LOW (ref 38–126)
Anion gap: 8 (ref 5–15)
BUN: 31 mg/dL — ABNORMAL HIGH (ref 8–23)
CO2: 24 mmol/L (ref 22–32)
Calcium: 9.2 mg/dL (ref 8.9–10.3)
Chloride: 107 mmol/L (ref 98–111)
Creatinine: 1.25 mg/dL — ABNORMAL HIGH (ref 0.44–1.00)
GFR, Estimated: 46 mL/min — ABNORMAL LOW (ref >60)
Glucose, Bld: 93 mg/dL (ref 70–99)
Potassium: 3.8 mmol/L (ref 3.5–5.1)
Sodium: 139 mmol/L (ref 135–145)
Total Bilirubin: 0.6 mg/dL (ref 0.3–1.2)
Total Protein: 7 g/dL (ref 6.5–8.1)

## 2021-07-02 LAB — LACTATE DEHYDROGENASE: LDH: 175 U/L (ref 98–192)

## 2021-07-02 MED ORDER — DEXAMETHASONE 4 MG PO TABS
40.0000 mg | ORAL_TABLET | Freq: Once | ORAL | Status: AC
  Administered 2021-07-02: 40 mg via ORAL
  Filled 2021-07-02: qty 10

## 2021-07-02 MED ORDER — CYCLOPHOSPHAMIDE 50 MG PO CAPS
300.0000 mg/m2 | ORAL_CAPSULE | Freq: Every day | ORAL | 2 refills | Status: DC
  Filled 2021-07-02: qty 40, 4d supply, fill #0

## 2021-07-02 MED ORDER — ACETAMINOPHEN 325 MG PO TABS
650.0000 mg | ORAL_TABLET | Freq: Once | ORAL | Status: AC
  Administered 2021-07-02: 650 mg via ORAL
  Filled 2021-07-02: qty 2

## 2021-07-02 MED ORDER — BORTEZOMIB CHEMO SQ INJECTION 3.5 MG (2.5MG/ML)
1.5000 mg/m2 | Freq: Once | INTRAMUSCULAR | Status: AC
  Administered 2021-07-02: 2.25 mg via SUBCUTANEOUS
  Filled 2021-07-02: qty 0.9

## 2021-07-02 MED ORDER — ONDANSETRON HCL 8 MG PO TABS
8.0000 mg | ORAL_TABLET | Freq: Once | ORAL | Status: AC
  Administered 2021-07-02: 8 mg via ORAL
  Filled 2021-07-02: qty 1

## 2021-07-02 NOTE — Progress Notes (Signed)
500 mg cyclophosphomide po given while in the infusion room

## 2021-07-02 NOTE — Telephone Encounter (Signed)
Scheduled follow-up appointments per 2/21 los. Patient is aware.

## 2021-07-02 NOTE — Progress Notes (Signed)
Daratumumab not ordered today. May be added in the future per Dr. Irene Limbo

## 2021-07-02 NOTE — Patient Instructions (Signed)
[facility]  Discharge Instructions: Thank you for choosing [facility] to provide your oncology and hematology care.   If you have a lab appointment with the [facility], please go directly to the [facility] and check in at the registration area.   Wear comfortable clothing and clothing appropriate for easy access to any Portacath or PICC line.   We strive to give you quality time with your provider. You may need to reschedule your appointment if you arrive late (15 or more minutes).  Arriving late affects you and other patients whose appointments are after yours.  Also, if you miss three or more appointments without notifying the office, you may be dismissed from the clinic at the providers discretion.      For prescription refill requests, have your pharmacy contact our office and allow 72 hours for refills to be completed.    Today you received the following chemotherapy and/or immunotherapy agents:  Bortezomib (Velcade)  Cyclophosphamide (Cytoxan)   To help prevent nausea and vomiting after your treatment, we encourage you to take your nausea medication as directed.  BELOW ARE SYMPTOMS THAT SHOULD BE REPORTED IMMEDIATELY: *FEVER GREATER THAN 100.4 F (38 C) OR HIGHER *CHILLS OR SWEATING *NAUSEA AND VOMITING THAT IS NOT CONTROLLED WITH YOUR NAUSEA MEDICATION *UNUSUAL SHORTNESS OF BREATH *UNUSUAL BRUISING OR BLEEDING *URINARY PROBLEMS (pain or burning when urinating, or frequent urination) *BOWEL PROBLEMS (unusual diarrhea, constipation, pain near the anus) TENDERNESS IN MOUTH AND THROAT WITH OR WITHOUT PRESENCE OF ULCERS (sore throat, sores in mouth, or a toothache) UNUSUAL RASH, SWELLING OR PAIN  UNUSUAL VAGINAL DISCHARGE OR ITCHING   Items with * indicate a potential emergency and should be followed up as soon as possible or go to the Emergency Department if any problems should occur.  Please show the CHEMOTHERAPY ALERT CARD or  IMMUNOTHERAPY ALERT CARD at check-in to the Emergency Department and triage nurse.  Should you have questions after your visit or need to cancel or reschedule your appointment, please contact [facility]  Dept: [phone]  and follow the prompts.  Office hours are 8:00 a.m. to 4:30 p.m. Monday - Friday. Please note that voicemails left after 4:00 p.m. may not be returned until the following business day.  We are closed weekends and major holidays. You have access to a nurse at all times for urgent questions. Please call the main number to the clinic Dept: [phone] and follow the prompts.   For any non-urgent questions, you may also contact your provider using MyChart. We now offer e-Visits for anyone 64 and older to request care online for non-urgent symptoms. For details visit [email or web address].   Also download the MyChart app! Go to the app store, search "MyChart", open the app, select [facility], and log in with your MyChart username and password.  Due to Covid, a mask is required upon entering the hospital/clinic. If you do not have a mask, one will be given to you upon arrival. For doctor visits, patients may have 1 support person aged 25 or older with them. For treatment visits, patients cannot have anyone with them due to current Covid guidelines and our immunocompromised population.   Bortezomib (Velcade) injection What is this medication? BORTEZOMIB (bor TEZ oh mib) targets proteins in cancer cells and stops the cancer cells from growing. It treats multiple myeloma and mantle cell lymphoma. This medicine may be used for other purposes; ask your health care provider or pharmacist  if you have questions. COMMON BRAND NAME(S): Velcade What should I tell my care team before I take this medication? They need to know if you have any of these conditions: dehydration diabetes (high blood sugar) heart disease liver disease tingling of the fingers or  toes or other nerve disorder an unusual or allergic reaction to bortezomib, mannitol, boron, other medicines, foods, dyes, or preservatives pregnant or trying to get pregnant breast-feeding How should I use this medication? This medicine is injected into a vein or under the skin. It is given by a health care provider in a hospital or clinic setting. Talk to your health care provider about the use of this medicine in children. Special care may be needed. Overdosage: If you think you have taken too much of this medicine contact a poison control center or emergency room at once. NOTE: This medicine is only for you. Do not share this medicine with others. What if I miss a dose? Keep appointments for follow-up doses. It is important not to miss your dose. Call your health care provider if you are unable to keep an appointment. What may interact with this medication? This medicine may interact with the following medications: ketoconazole rifampin This list may not describe all possible interactions. Give your health care provider a list of all the medicines, herbs, non-prescription drugs, or dietary supplements you use. Also tell them if you smoke, drink alcohol, or use illegal drugs. Some items may interact with your medicine. What should I watch for while using this medication? Your condition will be monitored carefully while you are receiving this medicine. You may need blood work done while you are taking this medicine. You may get drowsy or dizzy. Do not drive, use machinery, or do anything that needs mental alertness until you know how this medicine affects you. Do not stand up or sit up quickly, especially if you are an older patient. This reduces the risk of dizzy or fainting spells This medicine may increase your risk of getting an infection. Call your health care provider for advice if you get a fever, chills, sore throat, or other symptoms of a cold or flu. Do not treat yourself. Try to  avoid being around people who are sick. Check with your health care provider if you have severe diarrhea, nausea, and vomiting, or if you sweat a lot. The loss of too much body fluid may make it dangerous for you to take this medicine. Do not become pregnant while taking this medicine or for 7 months after stopping it. Women should inform their health care provider if they wish to become pregnant or think they might be pregnant. Men should not father a child while taking this medicine and for 4 months after stopping it. There is a potential for serious harm to an unborn child. Talk to your health care provider for more information. Do not breast-feed an infant while taking this medicine or for 2 months after stopping it. This medicine may make it more difficult to get pregnant or father a child. Talk to your health care provider if you are concerned about your fertility. What side effects may I notice from receiving this medication? Side effects that you should report to your doctor or health care professional as soon as possible: allergic reactions (skin rash; itching or hives; swelling of the face, lips, or tongue) bleeding (bloody or black, tarry stools; red or dark brown urine; spitting up blood or brown material that looks like coffee grounds; red spots on  the skin; unusual bruising or bleeding from the eye, gums, or nose) blurred vision or changes in vision confusion constipation headache heart failure (trouble breathing; fast, irregular heartbeat; sudden weight gain; swelling of the ankles, feet, hands) infection (fever, chills, cough, sore throat, pain or trouble passing urine) lack or loss of appetite liver injury (dark yellow or brown urine; general ill feeling or flu-like symptoms; loss of appetite, right upper belly pain; yellowing of the eyes or skin) low blood pressure (dizziness; feeling faint or lightheaded, falls; unusually weak or tired) muscle cramps pain, redness, or irritation  at site where injected pain, tingling, numbness in the hands or feet seizures trouble breathing unusual bruising or bleeding Side effects that usually do not require medical attention (report to your doctor or health care professional if they continue or are bothersome): diarrhea nausea stomach pain trouble sleeping vomiting This list may not describe all possible side effects. Call your doctor for medical advice about side effects. You may report side effects to FDA at 1-800-FDA-1088. Where should I keep my medication? This medicine is given in a hospital or clinic. It will not be stored at home. NOTE: This sheet is a summary. It may not cover all possible information. If you have questions about this medicine, talk to your doctor, pharmacist, or health care provider.  2022 Elsevier/Gold Standard (2020-04-13 00:00:00)  Cyclophosphamide (Cytoxan)Tablets or Capsules What is this medication? CYCLOPHOSPHAMIDE (sye kloe FOSS fa mide) is a chemotherapy drug. It slows the growth of cancer cells. This medicine is used to treat many types of cancer like lymphoma, myeloma, leukemia, breast cancer, and ovarian cancer, to name a few. It is also used to treat nephrotic syndrome in children. This medicine may be used for other purposes; ask your health care provider or pharmacist if you have questions. COMMON BRAND NAME(S): Cytoxan What should I tell my care team before I take this medication? They need to know if you have any of these conditions: heart disease history of irregular heartbeat infection kidney disease liver disease low blood counts, like Weissman cells, platelets, or red blood cells on hemodialysis recent or ongoing radiation therapy scarring or thickening of the lungs trouble passing urine an unusual or allergic reaction to cyclophosphamide, other medicines, foods, dyes, or preservatives pregnant or trying to get pregnant breast-feeding How should I use this medication? Take  this medicine by mouth with a glass of water. Follow the directions on the prescription label. Do not cut, crush or chew this medicine. Take your medicine at regular intervals. Do not take it more often than directed. Do not stop taking except on your doctor's advice. Talk to your pediatrician regarding the use of this medicine in children. Special care may be needed. Overdosage: If you think you have taken too much of this medicine contact a poison control center or emergency room at once. NOTE: This medicine is only for you. Do not share this medicine with others. What if I miss a dose? If you miss a dose, take it as soon as you can. If it is almost time for your next dose, take only that dose. Do not take double or extra doses. What may interact with this medication? amphotericin B azathioprine certain antivirals for HIV or hepatitis certain medicines for blood pressure, heart disease, irregular heart beat certain medicines that treat or prevent blood clots like warfarin certain other medicines for cancer cyclosporine etanercept indomethacin medicines that relax muscles for surgery medicines to increase blood counts metronidazole This list may not  describe all possible interactions. Give your health care provider a list of all the medicines, herbs, non-prescription drugs, or dietary supplements you use. Also tell them if you smoke, drink alcohol, or use illegal drugs. Some items may interact with your medicine. What should I watch for while using this medication? Your condition will be monitored carefully while you are receiving this medicine. You may need blood work done while you are taking this medicine. Drink water or other fluids as directed. Urinate often, even at night. Do not become pregnant while taking this medicine or for 1 year after stopping it. Women should inform their health care professional if they wish to become pregnant or think they might be pregnant. Men should not  father a child while taking this medicine and for 4 months after stopping it. There is potential for serious side effects to an unborn child. Talk to your health care professional for more information. Do not breast-feed an infant while taking this medicine or for 1 week after stopping it. This medicine has caused ovarian failure in some women. This medicine may make it more difficult to get pregnant. Talk to your health care professional if you are concerned about your fertility. This medicine has caused decreased sperm counts in some men. This may make it more difficult to father a child. Talk to your health care professional if you are concerned about your fertility. Call your health care professional for advice if you get a fever, chills, or sore throat, or other symptoms of a cold or flu. Do not treat yourself. This medicine decreases your body's ability to fight infections. Try to avoid being around people who are sick. Avoid taking medicines that contain aspirin, acetaminophen, ibuprofen, naproxen, or ketoprofen unless instructed by your health care professional. These medicines may hide a fever. Talk to your health care professional about your risk of cancer. You may be more at risk for certain types of cancer if you take this medicine. If you are going to need surgery or other procedure, tell your health care professional that you are using this medicine. Be careful brushing or flossing your teeth or using a toothpick because you may get an infection or bleed more easily. If you have any dental work done, tell your dentist you are receiving this medicine. What side effects may I notice from receiving this medication? Side effects that you should report to your doctor or health care professional as soon as possible: allergic reactions like skin rash, itching or hives, swelling of the face, lips, or tongue breathing problems nausea, vomiting signs and symptoms of bleeding such as bloody or  black, tarry stools; red or dark brown urine; spitting up blood or brown material that looks like coffee grounds; red spots on the skin; unusual bruising or bleeding from the eyes, gums, or nose signs and symptoms of heart failure like fast, irregular heartbeat, sudden weight gain; swelling of the ankles, feet, hands signs and symptoms of infection like fever; chills; cough; sore throat; pain or trouble passing urine signs and symptoms of kidney injury like trouble passing urine or change in the amount of urine signs and symptoms of liver injury like dark yellow or brown urine; general ill feeling or flu-like symptoms; light-colored stools; loss of appetite; nausea; right upper belly pain; unusually weak or tired; yellowing of the eyes or skin Side effects that usually do not require medical attention (report to your doctor or health care professional if they continue or are bothersome): confusion decreased hearing  diarrhea facial flushing hair loss headache loss of appetite missed menstrual periods signs and symptoms of low red blood cells or anemia such as unusually weak or tired; feeling faint or lightheaded; falls skin discoloration This list may not describe all possible side effects. Call your doctor for medical advice about side effects. You may report side effects to FDA at 1-800-FDA-1088. Where should I keep my medication? Keep out of the reach of children. Store at room temperature between 20 and 25 degrees C (68 and 77 degrees F). Throw away any unused medicine after the expiration date. NOTE: This sheet is a summary. It may not cover all possible information. If you have questions about this medicine, talk to your doctor, pharmacist, or health care provider.  2022 Elsevier/Gold Standard (2021-01-09 00:00:00)

## 2021-07-03 ENCOUNTER — Telehealth: Payer: Self-pay | Admitting: *Deleted

## 2021-07-03 NOTE — Telephone Encounter (Signed)
Attempted to call pt for chemo f/u call.  No answer.  Left  message on voice mail requesting a call back to nurse.

## 2021-07-03 NOTE — Telephone Encounter (Signed)
Spoke with pt for chemo f/u call.  Per pt,  she is doing fine.  Has mild nausea but no vomiting.  Takes antiemetic as prescribed with relief.  Eating and drinking lots of fluids as tolerated.  Bowel and bladder function fine.  No concerns nor questions at this time.  Pt aware of next f/u appt with Dr. Irene Limbo on 3/7.

## 2021-07-06 ENCOUNTER — Other Ambulatory Visit: Payer: Self-pay | Admitting: *Deleted

## 2021-07-06 DIAGNOSIS — C9000 Multiple myeloma not having achieved remission: Secondary | ICD-10-CM

## 2021-07-09 ENCOUNTER — Encounter: Payer: Self-pay | Admitting: Hematology

## 2021-07-09 NOTE — Progress Notes (Signed)
Called pt to introduce myself as her Arboriculturist and to discuss the [employer].  Pt has 2 insurances so copay assistance isn't needed.  I was unable to leave a msg because her mailbox is full.  I will plan to meet with her on 07/10/21 regarding the [employer].  ?

## 2021-07-10 ENCOUNTER — Inpatient Hospital Stay (HOSPITAL_BASED_OUTPATIENT_CLINIC_OR_DEPARTMENT_OTHER): Payer: Medicare Other | Admitting: Hematology

## 2021-07-10 ENCOUNTER — Inpatient Hospital Stay: Payer: Medicare Other

## 2021-07-10 ENCOUNTER — Encounter: Payer: Self-pay | Admitting: Hematology

## 2021-07-10 ENCOUNTER — Inpatient Hospital Stay: Payer: Medicare Other | Attending: Hematology

## 2021-07-10 ENCOUNTER — Other Ambulatory Visit: Payer: Self-pay

## 2021-07-10 VITALS — BP 128/76 | HR 57 | Resp 18

## 2021-07-10 DIAGNOSIS — Z7289 Other problems related to lifestyle: Secondary | ICD-10-CM | POA: Insufficient documentation

## 2021-07-10 DIAGNOSIS — R110 Nausea: Secondary | ICD-10-CM | POA: Diagnosis not present

## 2021-07-10 DIAGNOSIS — Z800 Family history of malignant neoplasm of digestive organs: Secondary | ICD-10-CM | POA: Insufficient documentation

## 2021-07-10 DIAGNOSIS — F419 Anxiety disorder, unspecified: Secondary | ICD-10-CM | POA: Insufficient documentation

## 2021-07-10 DIAGNOSIS — Z79899 Other long term (current) drug therapy: Secondary | ICD-10-CM | POA: Insufficient documentation

## 2021-07-10 DIAGNOSIS — R803 Bence Jones proteinuria: Secondary | ICD-10-CM | POA: Diagnosis present

## 2021-07-10 DIAGNOSIS — C9000 Multiple myeloma not having achieved remission: Secondary | ICD-10-CM | POA: Diagnosis present

## 2021-07-10 DIAGNOSIS — Z833 Family history of diabetes mellitus: Secondary | ICD-10-CM | POA: Insufficient documentation

## 2021-07-10 DIAGNOSIS — Z8616 Personal history of COVID-19: Secondary | ICD-10-CM | POA: Insufficient documentation

## 2021-07-10 DIAGNOSIS — Z8249 Family history of ischemic heart disease and other diseases of the circulatory system: Secondary | ICD-10-CM | POA: Diagnosis not present

## 2021-07-10 DIAGNOSIS — Z801 Family history of malignant neoplasm of trachea, bronchus and lung: Secondary | ICD-10-CM | POA: Diagnosis not present

## 2021-07-10 DIAGNOSIS — Z7189 Other specified counseling: Secondary | ICD-10-CM | POA: Diagnosis not present

## 2021-07-10 DIAGNOSIS — I10 Essential (primary) hypertension: Secondary | ICD-10-CM | POA: Diagnosis not present

## 2021-07-10 DIAGNOSIS — D8989 Other specified disorders involving the immune mechanism, not elsewhere classified: Secondary | ICD-10-CM

## 2021-07-10 DIAGNOSIS — Z5112 Encounter for antineoplastic immunotherapy: Secondary | ICD-10-CM | POA: Diagnosis present

## 2021-07-10 DIAGNOSIS — Z8042 Family history of malignant neoplasm of prostate: Secondary | ICD-10-CM | POA: Diagnosis not present

## 2021-07-10 DIAGNOSIS — F15982 Other stimulant use, unspecified with stimulant-induced sleep disorder: Secondary | ICD-10-CM | POA: Diagnosis not present

## 2021-07-10 DIAGNOSIS — Z853 Personal history of malignant neoplasm of breast: Secondary | ICD-10-CM | POA: Insufficient documentation

## 2021-07-10 DIAGNOSIS — Z87891 Personal history of nicotine dependence: Secondary | ICD-10-CM | POA: Diagnosis not present

## 2021-07-10 LAB — CMP (CANCER CENTER ONLY)
ALT: 20 U/L (ref 0–44)
AST: 24 U/L (ref 15–41)
Albumin: 4.4 g/dL (ref 3.5–5.0)
Alkaline Phosphatase: 30 U/L — ABNORMAL LOW (ref 38–126)
Anion gap: 7 (ref 5–15)
BUN: 32 mg/dL — ABNORMAL HIGH (ref 8–23)
CO2: 27 mmol/L (ref 22–32)
Calcium: 9.2 mg/dL (ref 8.9–10.3)
Chloride: 102 mmol/L (ref 98–111)
Creatinine: 1.26 mg/dL — ABNORMAL HIGH (ref 0.44–1.00)
GFR, Estimated: 45 mL/min — ABNORMAL LOW (ref >60)
Glucose, Bld: 99 mg/dL (ref 70–99)
Potassium: 3.7 mmol/L (ref 3.5–5.1)
Sodium: 136 mmol/L (ref 135–145)
Total Bilirubin: 0.5 mg/dL (ref 0.3–1.2)
Total Protein: 6.7 g/dL (ref 6.5–8.1)

## 2021-07-10 LAB — CBC WITH DIFFERENTIAL (CANCER CENTER ONLY)
Abs Immature Granulocytes: 0.03 10*3/uL (ref 0.00–0.07)
Basophils Absolute: 0.1 10*3/uL (ref 0.0–0.1)
Basophils Relative: 1 %
Eosinophils Absolute: 0.1 10*3/uL (ref 0.0–0.5)
Eosinophils Relative: 2 %
HCT: 29.3 % — ABNORMAL LOW (ref 36.0–46.0)
Hemoglobin: 10 g/dL — ABNORMAL LOW (ref 12.0–15.0)
Immature Granulocytes: 1 %
Lymphocytes Relative: 31 %
Lymphs Abs: 1.9 10*3/uL (ref 0.7–4.0)
MCH: 31.1 pg (ref 26.0–34.0)
MCHC: 34.1 g/dL (ref 30.0–36.0)
MCV: 91 fL (ref 80.0–100.0)
Monocytes Absolute: 0.4 10*3/uL (ref 0.1–1.0)
Monocytes Relative: 6 %
Neutro Abs: 3.7 10*3/uL (ref 1.7–7.7)
Neutrophils Relative %: 59 %
Platelet Count: 224 10*3/uL (ref 150–400)
RBC: 3.22 MIL/uL — ABNORMAL LOW (ref 3.87–5.11)
RDW: 13.3 % (ref 11.5–15.5)
WBC Count: 6.1 10*3/uL (ref 4.0–10.5)
nRBC: 0 % (ref 0.0–0.2)

## 2021-07-10 LAB — LACTATE DEHYDROGENASE: LDH: 153 U/L (ref 98–192)

## 2021-07-10 MED ORDER — ACETAMINOPHEN 325 MG PO TABS
650.0000 mg | ORAL_TABLET | Freq: Once | ORAL | Status: AC
  Administered 2021-07-10: 650 mg via ORAL
  Filled 2021-07-10: qty 2

## 2021-07-10 MED ORDER — DEXAMETHASONE 4 MG PO TABS
20.0000 mg | ORAL_TABLET | Freq: Once | ORAL | Status: AC
  Administered 2021-07-10: 20 mg via ORAL
  Filled 2021-07-10: qty 5

## 2021-07-10 MED ORDER — ONDANSETRON HCL 8 MG PO TABS
12.0000 mg | ORAL_TABLET | Freq: Once | ORAL | Status: AC
  Administered 2021-07-10: 12 mg via ORAL
  Filled 2021-07-10: qty 2

## 2021-07-10 MED ORDER — CYCLOPHOSPHAMIDE 50 MG PO CAPS: 300.0000 mg/m2 | ORAL_CAPSULE | Freq: Every day | ORAL | 2 refills | Status: DC

## 2021-07-10 MED ORDER — FAMOTIDINE 20 MG PO TABS
20.0000 mg | ORAL_TABLET | Freq: Once | ORAL | Status: AC
  Administered 2021-07-10: 20 mg via ORAL
  Filled 2021-07-10: qty 1

## 2021-07-10 MED ORDER — BORTEZOMIB CHEMO SQ INJECTION 3.5 MG (2.5MG/ML)
1.5000 mg/m2 | Freq: Once | INTRAMUSCULAR | Status: AC
  Administered 2021-07-10: 2.25 mg via SUBCUTANEOUS
  Filled 2021-07-10: qty 0.9

## 2021-07-10 NOTE — Patient Instructions (Signed)
Bortezomib injection ?What is this medication? ?BORTEZOMIB (bor TEZ oh mib) targets proteins in cancer cells and stops the cancer cells from growing. It treats multiple myeloma and mantle cell lymphoma. ?This medicine may be used for other purposes; ask your health care provider or pharmacist if you have questions. ?COMMON BRAND NAME(S): Velcade ?What should I tell my care team before I take this medication? ?They need to know if you have any of these conditions: ?dehydration ?diabetes (high blood sugar) ?heart disease ?liver disease ?tingling of the fingers or toes or other nerve disorder ?an unusual or allergic reaction to bortezomib, mannitol, boron, other medicines, foods, dyes, or preservatives ?pregnant or trying to get pregnant ?breast-feeding ?How should I use this medication? ?This medicine is injected into a vein or under the skin. It is given by a health care provider in a hospital or clinic setting. ?Talk to your health care provider about the use of this medicine in children. Special care may be needed. ?Overdosage: If you think you have taken too much of this medicine contact a poison control center or emergency room at once. ?NOTE: This medicine is only for you. Do not share this medicine with others. ?What if I miss a dose? ?Keep appointments for follow-up doses. It is important not to miss your dose. Call your health care provider if you are unable to keep an appointment. ?What may interact with this medication? ?This medicine may interact with the following medications: ?ketoconazole ?rifampin ?This list may not describe all possible interactions. Give your health care provider a list of all the medicines, herbs, non-prescription drugs, or dietary supplements you use. Also tell them if you smoke, drink alcohol, or use illegal drugs. Some items may interact with your medicine. ?What should I watch for while using this medication? ?Your condition will be monitored carefully while you are receiving  this medicine. ?You may need blood work done while you are taking this medicine. ?You may get drowsy or dizzy. Do not drive, use machinery, or do anything that needs mental alertness until you know how this medicine affects you. Do not stand up or sit up quickly, especially if you are an older patient. This reduces the risk of dizzy or fainting spells ?This medicine may increase your risk of getting an infection. Call your health care provider for advice if you get a fever, chills, sore throat, or other symptoms of a cold or flu. Do not treat yourself. Try to avoid being around people who are sick. ?Check with your health care provider if you have severe diarrhea, nausea, and vomiting, or if you sweat a lot. The loss of too much body fluid may make it dangerous for you to take this medicine. ?Do not become pregnant while taking this medicine or for 7 months after stopping it. Women should inform their health care provider if they wish to become pregnant or think they might be pregnant. Men should not father a child while taking this medicine and for 4 months after stopping it. There is a potential for serious harm to an unborn child. Talk to your health care provider for more information. Do not breast-feed an infant while taking this medicine or for 2 months after stopping it. ?This medicine may make it more difficult to get pregnant or father a child. Talk to your health care provider if you are concerned about your fertility. ?What side effects may I notice from receiving this medication? ?Side effects that you should report to your doctor or health  care professional as soon as possible: ?allergic reactions (skin rash; itching or hives; swelling of the face, lips, or tongue) ?bleeding (bloody or black, tarry stools; red or dark brown urine; spitting up blood or brown material that looks like coffee grounds; red spots on the skin; unusual bruising or bleeding from the eye, gums, or nose) ?blurred vision or changes  in vision ?confusion ?constipation ?headache ?heart failure (trouble breathing; fast, irregular heartbeat; sudden weight gain; swelling of the ankles, feet, hands) ?infection (fever, chills, cough, sore throat, pain or trouble passing urine) ?lack or loss of appetite ?liver injury (dark yellow or brown urine; general ill feeling or flu-like symptoms; loss of appetite, right upper belly pain; yellowing of the eyes or skin) ?low blood pressure (dizziness; feeling faint or lightheaded, falls; unusually weak or tired) ?muscle cramps ?pain, redness, or irritation at site where injected ?pain, tingling, numbness in the hands or feet ?seizures ?trouble breathing ?unusual bruising or bleeding ?Side effects that usually do not require medical attention (report to your doctor or health care professional if they continue or are bothersome): ?diarrhea ?nausea ?stomach pain ?trouble sleeping ?vomiting ?This list may not describe all possible side effects. Call your doctor for medical advice about side effects. You may report side effects to FDA at 1-800-FDA-1088. ?Where should I keep my medication? ?This medicine is given in a hospital or clinic. It will not be stored at home. ?NOTE: This sheet is a summary. It may not cover all possible information. If you have questions about this medicine, talk to your doctor, pharmacist, or health care provider. ?? 2022 Elsevier/Gold Standard (2020-04-13 00:00:00) ?

## 2021-07-10 NOTE — Progress Notes (Signed)
Met w/ pt to introduce myself as her Arboriculturist.  Pt has 2 insurances so copay assistance isn't needed.  I offered the [employer] and went over what it covers, pt declined the grant wanting to leave it for someone in greater need.  I gave her my card for any questions or concerns she may have in the future.  ?

## 2021-07-11 ENCOUNTER — Telehealth: Payer: Self-pay | Admitting: Hematology

## 2021-07-11 ENCOUNTER — Ambulatory Visit (HOSPITAL_COMMUNITY): Payer: Medicare Other | Attending: Cardiovascular Disease

## 2021-07-11 DIAGNOSIS — I422 Other hypertrophic cardiomyopathy: Secondary | ICD-10-CM | POA: Diagnosis not present

## 2021-07-11 LAB — ECHOCARDIOGRAM COMPLETE
AR max vel: 3.09 cm2
AV Area VTI: 3.2 cm2
AV Area mean vel: 2.99 cm2
AV Mean grad: 7.3 mmHg
AV Peak grad: 13.5 mmHg
Ao pk vel: 1.84 m/s
Area-P 1/2: 3.81 cm2
S' Lateral: 2.2 cm

## 2021-07-11 NOTE — Telephone Encounter (Signed)
Left message with follow-up appointments per 3/7 los. ?

## 2021-07-12 ENCOUNTER — Other Ambulatory Visit (HOSPITAL_COMMUNITY): Payer: Self-pay

## 2021-07-13 ENCOUNTER — Other Ambulatory Visit: Payer: Self-pay | Admitting: *Deleted

## 2021-07-13 ENCOUNTER — Other Ambulatory Visit (HOSPITAL_COMMUNITY): Payer: Self-pay

## 2021-07-13 ENCOUNTER — Other Ambulatory Visit: Payer: Self-pay

## 2021-07-13 ENCOUNTER — Telehealth: Payer: Self-pay | Admitting: Hematology

## 2021-07-13 DIAGNOSIS — I3139 Other pericardial effusion (noninflammatory): Secondary | ICD-10-CM

## 2021-07-13 DIAGNOSIS — C9000 Multiple myeloma not having achieved remission: Secondary | ICD-10-CM

## 2021-07-13 NOTE — Telephone Encounter (Signed)
Patient called to reschedule her upcoming appointments. ?

## 2021-07-16 ENCOUNTER — Other Ambulatory Visit: Payer: Self-pay

## 2021-07-16 ENCOUNTER — Inpatient Hospital Stay: Payer: Medicare Other

## 2021-07-16 VITALS — BP 136/64 | HR 63 | Temp 97.8°F | Resp 16 | Ht 63.0 in | Wt 117.6 lb

## 2021-07-16 DIAGNOSIS — D8989 Other specified disorders involving the immune mechanism, not elsewhere classified: Secondary | ICD-10-CM

## 2021-07-16 DIAGNOSIS — Z5112 Encounter for antineoplastic immunotherapy: Secondary | ICD-10-CM | POA: Diagnosis not present

## 2021-07-16 DIAGNOSIS — C9000 Multiple myeloma not having achieved remission: Secondary | ICD-10-CM

## 2021-07-16 DIAGNOSIS — Z7189 Other specified counseling: Secondary | ICD-10-CM

## 2021-07-16 LAB — CMP (CANCER CENTER ONLY)
ALT: 23 U/L (ref 0–44)
AST: 25 U/L (ref 15–41)
Albumin: 4.4 g/dL (ref 3.5–5.0)
Alkaline Phosphatase: 35 U/L — ABNORMAL LOW (ref 38–126)
Anion gap: 8 (ref 5–15)
BUN: 37 mg/dL — ABNORMAL HIGH (ref 8–23)
CO2: 26 mmol/L (ref 22–32)
Calcium: 9.7 mg/dL (ref 8.9–10.3)
Chloride: 103 mmol/L (ref 98–111)
Creatinine: 1.24 mg/dL — ABNORMAL HIGH (ref 0.44–1.00)
GFR, Estimated: 46 mL/min — ABNORMAL LOW (ref >60)
Glucose, Bld: 98 mg/dL (ref 70–99)
Potassium: 4.4 mmol/L (ref 3.5–5.1)
Sodium: 137 mmol/L (ref 135–145)
Total Bilirubin: 0.5 mg/dL (ref 0.3–1.2)
Total Protein: 6.6 g/dL (ref 6.5–8.1)

## 2021-07-16 LAB — CBC WITH DIFFERENTIAL (CANCER CENTER ONLY)
Abs Immature Granulocytes: 0.01 10*3/uL (ref 0.00–0.07)
Basophils Absolute: 0 10*3/uL (ref 0.0–0.1)
Basophils Relative: 1 %
Eosinophils Absolute: 0.1 10*3/uL (ref 0.0–0.5)
Eosinophils Relative: 2 %
HCT: 32.1 % — ABNORMAL LOW (ref 36.0–46.0)
Hemoglobin: 10.7 g/dL — ABNORMAL LOW (ref 12.0–15.0)
Immature Granulocytes: 0 %
Lymphocytes Relative: 24 %
Lymphs Abs: 1.2 10*3/uL (ref 0.7–4.0)
MCH: 30.6 pg (ref 26.0–34.0)
MCHC: 33.3 g/dL (ref 30.0–36.0)
MCV: 91.7 fL (ref 80.0–100.0)
Monocytes Absolute: 0.3 10*3/uL (ref 0.1–1.0)
Monocytes Relative: 5 %
Neutro Abs: 3.3 10*3/uL (ref 1.7–7.7)
Neutrophils Relative %: 68 %
Platelet Count: 221 10*3/uL (ref 150–400)
RBC: 3.5 MIL/uL — ABNORMAL LOW (ref 3.87–5.11)
RDW: 13.2 % (ref 11.5–15.5)
WBC Count: 4.9 10*3/uL (ref 4.0–10.5)
nRBC: 0 % (ref 0.0–0.2)

## 2021-07-16 LAB — LACTATE DEHYDROGENASE: LDH: 145 U/L (ref 98–192)

## 2021-07-16 MED ORDER — ACETAMINOPHEN 325 MG PO TABS
650.0000 mg | ORAL_TABLET | Freq: Once | ORAL | Status: AC
  Administered 2021-07-16: 650 mg via ORAL
  Filled 2021-07-16: qty 2

## 2021-07-16 MED ORDER — ONDANSETRON HCL 8 MG PO TABS
12.0000 mg | ORAL_TABLET | Freq: Once | ORAL | Status: AC
  Administered 2021-07-16: 12 mg via ORAL
  Filled 2021-07-16: qty 2

## 2021-07-16 MED ORDER — FAMOTIDINE 20 MG PO TABS
20.0000 mg | ORAL_TABLET | Freq: Once | ORAL | Status: AC
  Administered 2021-07-16: 20 mg via ORAL
  Filled 2021-07-16: qty 1

## 2021-07-16 MED ORDER — BORTEZOMIB CHEMO SQ INJECTION 3.5 MG (2.5MG/ML)
1.5000 mg/m2 | Freq: Once | INTRAMUSCULAR | Status: AC
  Administered 2021-07-16: 2.25 mg via SUBCUTANEOUS
  Filled 2021-07-16: qty 0.9

## 2021-07-16 MED ORDER — DEXAMETHASONE 4 MG PO TABS
20.0000 mg | ORAL_TABLET | Freq: Once | ORAL | Status: AC
  Administered 2021-07-16: 20 mg via ORAL
  Filled 2021-07-16: qty 5

## 2021-07-16 NOTE — Patient Instructions (Signed)
[facility]ICAL [facility]COLOG[facility]  Discharge Instructions: Thank you for choosing [facility] to provide your oncology and hematology care.   If you have a lab appointment with the Cancer Center, please go directly to the Cancer Center and check in at the registration area.   Wear comfortable clothing and clothing appropriate for easy access to any Portacath or PICC line.   We strive to give you quality time with your provider. You may need to reschedule your appointment if you arrive late (15 or more minutes).  Arriving late affects you and other patients whose appointments are after yours.  Also, if you miss three or more appointments without notifying the office, you may be dismissed from the clinic at the provider's discretion.      For prescription refill requests, have your pharmacy contact our office and allow 72 hours for refills to be completed.    Today you received the following chemotherapy and/or immunotherapy agents Velcade       To help prevent nausea and vomiting after your treatment, we encourage you to take your nausea medication as directed.  BELOW ARE SYMPTOMS THAT SHOULD BE REPORTED IMMEDIATELY: *FEVER GREATER THAN 100.4 F (38 C) OR HIGHER *CHILLS OR SWEATING *NAUSEA AND VOMITING THAT IS NOT CONTROLLED WITH YOUR NAUSEA MEDICATION *UNUSUAL SHORTNESS OF BREATH *UNUSUAL BRUISING OR BLEEDING *URINARY PROBLEMS (pain or burning when urinating, or frequent urination) *BOWEL PROBLEMS (unusual diarrhea, constipation, pain near the anus) TENDERNESS IN MOUTH AND THROAT WITH OR WITHOUT PRESENCE OF ULCERS (sore throat, sores in mouth, or a toothache) UNUSUAL RASH, SWELLING OR PAIN  UNUSUAL VAGINAL DISCHARGE OR ITCHING   Items with * indicate a potential emergency and should be followed up as soon as possible or go to the Emergency Department if any problems should occur.  Please show the CHEMOTHERAPY ALERT CARD or IMMUNOTHERAPY ALERT CARD at check-in to  the Emergency Department and triage nurse.  Should you have questions after your visit or need to cancel or reschedule your appointment, please contact [facility]  Dept: [phone]  and follow the prompts.  Office hours are 8:00 a.m. to 4:30 p.m. Monday - Friday. Please note that voicemails left after 4:00 p.m. may not be returned until the following business day.  We are closed weekends and major holidays. You have access to a nurse at all times for urgent questions. Please call the main number to the clinic Dept: [phone] and follow the prompts.   For any non-urgent questions, you may also contact your provider using MyChart. We now offer e-Visits for anyone 18 and older to request care online for non-urgent symptoms. For details visit [email or web address].   Also download the MyChart app! Go to the app store, search "MyChart", open the app, select [facility], and log in with your MyChart username and password.  Due to Covid, a mask is required upon entering the hospital/clinic. If you do not have a mask, one will be given to you upon arrival. For doctor visits, patients may have 1 support person aged 18 or older with them. For treatment visits, patients cannot have anyone with them due to current Covid guidelines and our immunocompromised population.   

## 2021-07-16 NOTE — Progress Notes (Signed)
This RN witnessed pt take 10 PO caps of cyclophosphamide while in infusion room ?

## 2021-07-16 NOTE — Progress Notes (Signed)
Ok to use labs from 07/10/21 for treatment today per Dr Irene Limbo ?

## 2021-07-17 ENCOUNTER — Encounter: Payer: Self-pay | Admitting: Hematology

## 2021-07-17 NOTE — Progress Notes (Signed)
. ? ? ?HEMATOLOGY/ONCOLOGY CLINIC NOTE ? ?Date of Service: .07/10/2021 ? ? ?Patient Care Team: ?Ginger Organ., MD as PCP - General (Internal Medicine) ?Minus Breeding, MD as PCP - Cardiology (Cardiology) ? 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 ? ?HISTORY OF PRESENTING ILLNESS:  ? ?Traci [name] is a wonderful 71 y.o. female who has been referred to [place] by Dr .Brigitte Pulse, Emily Filbert., MD ? 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 ? ?Patient had work-up for mild anemia and progressive chronic kidney disease and was noted to have Bence-Jones proteinuria on a random urine sample.  This iwas not quantifiable. ? ?Patient notes she has lost about 30 pounds after having COVID-19 infection in January 2021 but has since gained back some of that weight and is eating better. ?Meuth no focal bone pains , no new hypercalcemia.  Stable anemia.  Recent bone survey that did not show any obvious metastatic lesions.. ? ?We discussed the importance and diagnostic possibilities in the context of Bence-Jones proteinuria including the possibility of multiple myeloma or lymphomas. ? ?INTERVAL HISTORY ? 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 ?She received her cycle 1 day 1 of CyBorD 1 week ago and notes that she tolerated reasonably well overall other than some mild nausea.  She needed some clarifications about the use of nausea medications which we discussed. ?She also felt somewhat anxious and jittery with the high-dose steroids and we discussed cutting the dexamethasone dose down to 20 mg weekly ?No fevers no chills no night sweats.  Good appetite. ?No infection issues. ?No other acute new symptoms. ?She notes that she had a good vacation prior to  starting her treatment and had a good time. ?Lab results from today were discussed in details. ? ?MEDICAL HISTORY:  ?Past Medical History:  ?Diagnosis Date  ? ASHD (arteriosclerotic heart disease)   ? Mild septal hypertrophy, 2D ECHO 11/2006, Dr. Percival Spanish  ? Breast cancer ([place])   ? PMH of, Dr. Marylene Buerger, Dr. Truddie Coco  multilpe sites overlaping, ER +  ? Facet syndrome, lumbar   ? Dr. Lynann Bologna  ? Heart murmur   ? no problems  ? History of COVID-19 05/07/2019  ? Hyperlipidemia   ? Hyperlipidemia   ? Hypertension   ? Osteopenia   ? Skin cancer, basal cell   ? Dr. Amy Martinique  ? ? ? ?SURGICAL HISTORY: ?Past Surgical History:  ?Procedure Laterality Date  ? CATARACT EXTRACTION Right 06/2020  ? CHOLECYSTECTOMY  about 1993  ? COLONOSCOPY    ?  X 3 negative (last 2008, due 2015), Dr. Olevia Perches  ? MASTECTOMY Left 11/2004  ? Dr. Marylene Buerger, also reduction of right breast and reconstruction on the left.  ? ROTATOR CUFF REPAIR Left   ? TONSILLECTOMY    ? TRANSESOPHAGEAL ECHOCARDIOGRAM  2012  ? Overall left ventricular systolic function was normal. Left ventricular ejection fraction was estimated, range being 55% to 60%. There were no left ventricular regional wall motional abnormalities. Left ventricular wall thickness was mildly increased. There was mild focal basal septal hypertrophy.  ? WISDOM TOOTH EXTRACTION    ? ? ?SOCIAL HISTORY: ?Social History  ? ?Socioeconomic History  ? Marital status: Married  ?  Spouse name: Not on file  ? Number of children: Not on file  ?  Years of education: Not on file  ? Highest education level: Not on file  ?Occupational History  ? Occupation: Retired  ?  Employer: UNEMPLOYED  ?Tobacco Use  ? Smoking status: Former  ?  Packs/day: 1.00  ?  Years: 10.00  ?  Pack years: 10.00  ?  Types: Cigarettes  ?  Quit date: 05/06/1978  ?  Years since quitting: 43.2  ? Smokeless tobacco: Never  ? Tobacco comments:  ?  smoked [place], up to 1 ppd  ?Vaping Use  ? Vaping Use: Never used  ?Substance and Sexual Activity   ? Alcohol use: Yes  ?  Alcohol/week: 4.0 standard drinks  ?  Types: 4 Glasses of wine per week  ? Drug use: No  ? Sexual activity: Yes  ?  Partners: Male  ?  Birth control/protection: Post-menopausal  ?Other Topics Concern  ? Not on file  ?Social History Narrative  ? Married  ? Regular exercise: Yes, high level CVE, walks 2 mpd 5x/ week & gym  ? No specific diet  ? ?Social Determinants of Health  ? ?Financial Resource Strain: Not on file  ?Food Insecurity: Not on file  ?Transportation Needs: Not on file  ?Physical Activity: Not on file  ?Stress: Not on file  ?Social Connections: Not on file  ?Intimate Partner Violence: Not on file  ? ? ?FAMILY HISTORY: ?Family History  ?Problem Relation Age of Onset  ? Hypertension Mother   ? Coronary artery disease Mother   ?     no MI  ? Lung cancer Mother   ?     smoker  ? Colon cancer Father 35  ? Coronary artery disease Father   ?     no MI  ? Prostate cancer Father   ? Diabetes Father   ? Hypertension Sister   ? Cancer Maternal Grandfather   ?     GI  ? Diabetes Paternal Grandfather   ? Stroke Neg Hx   ? Esophageal cancer Neg Hx   ? Stomach cancer Neg Hx   ? Rectal cancer Neg Hx   ? ? ?ALLERGIES:  is allergic to shrimp [shellfish allergy], hydrocodone, and vicodin [hydrocodone-acetaminophen]. ? ?MEDICATIONS:  ?Current Outpatient Medications  ?Medication Sig Dispense Refill  ? acyclovir (ZOVIRAX) 400 MG tablet Take 1 tablet (400 mg total) by mouth 2 (two) times daily. 30 tablet 6  ? amLODipine (NORVASC) 10 MG tablet Take 1 tablet (10 mg total) by mouth daily. 30 tablet 0  ? benazepril (LOTENSIN) 40 MG tablet Take 1 tablet (40 mg total) by mouth daily. 30 tablet 0  ? cyclophosphamide (CYTOXAN) 50 MG capsule Take 10 capsules (500 mg total) by mouth once a week. Take with food to minimize GI upset. Take early in the day and maintain hydration. (Bring to cancer center and take with Velcade injection after nausea medications). 40 capsule 2  ? cyclophosphamide (CYTOXAN) 50 MG  capsule Take 10 capsules (500 mg total) by mouth daily. Take with food to minimize GI upset. Take early in the day and maintain hydration. 40 capsule 2  ? denosumab (PROLIA) 60 MG/ML SOLN injection Inject 60 mg into the skin every 6 (six) months. Administer in upper arm, thigh, or abdomen    ? iron polysaccharides (NIFEREX) 150 MG capsule Take 1 capsule (150 mg total) by mouth daily. 30 capsule 2  ? LORazepam (ATIVAN) 0.5 MG tablet Take 1 tablet (0.5 mg total) by mouth every 6 (six) hours as needed (Nausea or vomiting).  30 tablet 0  ? ondansetron (ZOFRAN) 8 MG tablet Take 8 mg by mouth 30 to 60 min prior to Cytoxan administration then take 8 mg twice daily as needed for nausea and vomiting. 30 tablet 1  ? prochlorperazine (COMPAZINE) 10 MG tablet Take 1 tablet (10 mg total) by mouth every 6 (six) hours as needed (Nausea or vomiting). 30 tablet 1  ? rosuvastatin (CRESTOR) 20 MG tablet 1 tablet    ? ?No current facility-administered medications for this visit.  ? ? ?REVIEW OF SYSTEMS:   ?10 Point review of Systems was done is negative except as noted above. ? ?PHYSICAL EXAMINATION: ?.BP 133/80   Pulse 64   Temp 97.7 ?F (36.5 ?C)   Resp 18   Wt 119 lb 8 oz (54.2 kg)   LMP 05/07/1999 (LMP Unknown)   SpO2 99%   BMI 21.17 kg/m?  ?NAD ?GENERAL:alert, in no acute distress and comfortable ?SKIN: no acute rashes, no significant lesions ?EYES: conjunctiva are pink and non-injected, sclera anicteric ?OROPHARYNX: MMM, no exudates, no oropharyngeal erythema or ulceration ?NECK: supple, no JVD ?LYMPH:  no palpable lymphadenopathy in the cervical, axillary or inguinal regions ?LUNGS: clear to auscultation b/l with normal respiratory effort ?HEART: regular rate & rhythm ?ABDOMEN:  normoactive bowel sounds , non tender, not distended. ?Extremity: no pedal edema ?PSYCH: alert & oriented x 3 with fluent speech ?NEURO: no focal motor/sensory deficits ? ?LABORATORY DATA:  ?I have reviewed the data as listed ? ?. ?CBC Latest Ref  Rng & Units 07/16/2021 07/10/2021 07/02/2021  ?WBC 4.0 - 10.5 K/uL 4.9 6.1 5.3  ?Hemoglobin 12.0 - 15.0 g/dL 10.7(L) 10.0(L) 10.7(L)  ?Hematocrit 36.0 - 46.0 % 32.1(L) 29.3(L) 32.4(L)  ?Platelets 150 - 400 K/uL 221 224 228

## 2021-07-20 ENCOUNTER — Other Ambulatory Visit: Payer: Self-pay

## 2021-07-20 DIAGNOSIS — C9000 Multiple myeloma not having achieved remission: Secondary | ICD-10-CM

## 2021-07-23 ENCOUNTER — Ambulatory Visit: Payer: Medicare Other

## 2021-07-23 ENCOUNTER — Inpatient Hospital Stay: Payer: Medicare Other

## 2021-07-23 ENCOUNTER — Other Ambulatory Visit: Payer: Medicare Other

## 2021-07-23 ENCOUNTER — Other Ambulatory Visit: Payer: Self-pay

## 2021-07-23 ENCOUNTER — Other Ambulatory Visit: Payer: Self-pay | Admitting: Hematology

## 2021-07-23 ENCOUNTER — Other Ambulatory Visit (HOSPITAL_COMMUNITY): Payer: Self-pay

## 2021-07-23 VITALS — BP 124/68 | HR 74 | Temp 97.9°F | Resp 17 | Wt 119.5 lb

## 2021-07-23 DIAGNOSIS — Z5112 Encounter for antineoplastic immunotherapy: Secondary | ICD-10-CM | POA: Diagnosis not present

## 2021-07-23 DIAGNOSIS — Z7189 Other specified counseling: Secondary | ICD-10-CM

## 2021-07-23 DIAGNOSIS — D8989 Other specified disorders involving the immune mechanism, not elsewhere classified: Secondary | ICD-10-CM

## 2021-07-23 DIAGNOSIS — C9000 Multiple myeloma not having achieved remission: Secondary | ICD-10-CM

## 2021-07-23 LAB — CBC WITH DIFFERENTIAL (CANCER CENTER ONLY)
Abs Immature Granulocytes: 0.02 10*3/uL (ref 0.00–0.07)
Basophils Absolute: 0.1 10*3/uL (ref 0.0–0.1)
Basophils Relative: 1 %
Eosinophils Absolute: 0.1 10*3/uL (ref 0.0–0.5)
Eosinophils Relative: 3 %
HCT: 28.5 % — ABNORMAL LOW (ref 36.0–46.0)
Hemoglobin: 9.7 g/dL — ABNORMAL LOW (ref 12.0–15.0)
Immature Granulocytes: 1 %
Lymphocytes Relative: 28 %
Lymphs Abs: 1.2 10*3/uL (ref 0.7–4.0)
MCH: 31.1 pg (ref 26.0–34.0)
MCHC: 34 g/dL (ref 30.0–36.0)
MCV: 91.3 fL (ref 80.0–100.0)
Monocytes Absolute: 0.3 10*3/uL (ref 0.1–1.0)
Monocytes Relative: 7 %
Neutro Abs: 2.7 10*3/uL (ref 1.7–7.7)
Neutrophils Relative %: 60 %
Platelet Count: 224 10*3/uL (ref 150–400)
RBC: 3.12 MIL/uL — ABNORMAL LOW (ref 3.87–5.11)
RDW: 13.3 % (ref 11.5–15.5)
WBC Count: 4.4 10*3/uL (ref 4.0–10.5)
nRBC: 0 % (ref 0.0–0.2)

## 2021-07-23 LAB — CMP (CANCER CENTER ONLY)
ALT: 45 U/L — ABNORMAL HIGH (ref 0–44)
AST: 40 U/L (ref 15–41)
Albumin: 4.3 g/dL (ref 3.5–5.0)
Alkaline Phosphatase: 31 U/L — ABNORMAL LOW (ref 38–126)
Anion gap: 6 (ref 5–15)
BUN: 33 mg/dL — ABNORMAL HIGH (ref 8–23)
CO2: 29 mmol/L (ref 22–32)
Calcium: 9.2 mg/dL (ref 8.9–10.3)
Chloride: 102 mmol/L (ref 98–111)
Creatinine: 1.26 mg/dL — ABNORMAL HIGH (ref 0.44–1.00)
GFR, Estimated: 45 mL/min — ABNORMAL LOW (ref >60)
Glucose, Bld: 108 mg/dL — ABNORMAL HIGH (ref 70–99)
Potassium: 3.4 mmol/L — ABNORMAL LOW (ref 3.5–5.1)
Sodium: 137 mmol/L (ref 135–145)
Total Bilirubin: 0.4 mg/dL (ref 0.3–1.2)
Total Protein: 6.6 g/dL (ref 6.5–8.1)

## 2021-07-23 LAB — LACTATE DEHYDROGENASE: LDH: 143 U/L (ref 98–192)

## 2021-07-23 MED ORDER — DEXAMETHASONE 4 MG PO TABS
40.0000 mg | ORAL_TABLET | Freq: Once | ORAL | Status: AC
  Administered 2021-07-23: 20 mg via ORAL
  Filled 2021-07-23: qty 10

## 2021-07-23 MED ORDER — BORTEZOMIB CHEMO SQ INJECTION 3.5 MG (2.5MG/ML)
1.5000 mg/m2 | Freq: Once | INTRAMUSCULAR | Status: AC
  Administered 2021-07-23: 2.25 mg via SUBCUTANEOUS
  Filled 2021-07-23: qty 0.9

## 2021-07-23 MED ORDER — ACETAMINOPHEN 325 MG PO TABS
650.0000 mg | ORAL_TABLET | Freq: Once | ORAL | Status: AC
  Administered 2021-07-23: 650 mg via ORAL
  Filled 2021-07-23: qty 2

## 2021-07-23 MED ORDER — ONDANSETRON HCL 8 MG PO TABS
12.0000 mg | ORAL_TABLET | Freq: Once | ORAL | Status: AC
  Administered 2021-07-23: 12 mg via ORAL
  Filled 2021-07-23: qty 2

## 2021-07-23 MED ORDER — FAMOTIDINE 20 MG PO TABS
20.0000 mg | ORAL_TABLET | Freq: Once | ORAL | Status: AC
  Administered 2021-07-23: 20 mg via ORAL
  Filled 2021-07-23: qty 1

## 2021-07-23 NOTE — Progress Notes (Signed)
This RN witnessed pt take 10 PO caps of Cyclophosphamide while in infusion today. ?

## 2021-07-23 NOTE — Progress Notes (Signed)
Per Dr Irene Limbo '20mg'$  of dexamethasone to be give, not '40mg'$  as in the treatment plan today. ?

## 2021-07-30 ENCOUNTER — Inpatient Hospital Stay: Payer: Medicare Other

## 2021-07-30 ENCOUNTER — Other Ambulatory Visit: Payer: Self-pay

## 2021-07-30 ENCOUNTER — Ambulatory Visit: Payer: Medicare Other

## 2021-07-30 ENCOUNTER — Other Ambulatory Visit: Payer: Medicare Other

## 2021-07-30 ENCOUNTER — Inpatient Hospital Stay (HOSPITAL_BASED_OUTPATIENT_CLINIC_OR_DEPARTMENT_OTHER): Payer: Medicare Other | Admitting: Hematology

## 2021-07-30 VITALS — BP 112/74 | HR 69 | Temp 98.2°F | Resp 15 | Ht 63.0 in | Wt 119.5 lb

## 2021-07-30 DIAGNOSIS — C9000 Multiple myeloma not having achieved remission: Secondary | ICD-10-CM

## 2021-07-30 DIAGNOSIS — Z5111 Encounter for antineoplastic chemotherapy: Secondary | ICD-10-CM

## 2021-07-30 DIAGNOSIS — D8989 Other specified disorders involving the immune mechanism, not elsewhere classified: Secondary | ICD-10-CM

## 2021-07-30 DIAGNOSIS — Z7189 Other specified counseling: Secondary | ICD-10-CM

## 2021-07-30 DIAGNOSIS — Z5112 Encounter for antineoplastic immunotherapy: Secondary | ICD-10-CM | POA: Diagnosis not present

## 2021-07-30 LAB — CMP (CANCER CENTER ONLY)
ALT: 35 U/L (ref 0–44)
AST: 31 U/L (ref 15–41)
Albumin: 4.2 g/dL (ref 3.5–5.0)
Alkaline Phosphatase: 35 U/L — ABNORMAL LOW (ref 38–126)
Anion gap: 8 (ref 5–15)
BUN: 39 mg/dL — ABNORMAL HIGH (ref 8–23)
CO2: 27 mmol/L (ref 22–32)
Calcium: 9.5 mg/dL (ref 8.9–10.3)
Chloride: 102 mmol/L (ref 98–111)
Creatinine: 1.29 mg/dL — ABNORMAL HIGH (ref 0.44–1.00)
GFR, Estimated: 44 mL/min — ABNORMAL LOW (ref >60)
Glucose, Bld: 97 mg/dL (ref 70–99)
Potassium: 3.8 mmol/L (ref 3.5–5.1)
Sodium: 137 mmol/L (ref 135–145)
Total Bilirubin: 0.4 mg/dL (ref 0.3–1.2)
Total Protein: 6.4 g/dL — ABNORMAL LOW (ref 6.5–8.1)

## 2021-07-30 LAB — CBC WITH DIFFERENTIAL (CANCER CENTER ONLY)
Abs Immature Granulocytes: 0.02 10*3/uL (ref 0.00–0.07)
Basophils Absolute: 0 10*3/uL (ref 0.0–0.1)
Basophils Relative: 1 %
Eosinophils Absolute: 0.1 10*3/uL (ref 0.0–0.5)
Eosinophils Relative: 3 %
HCT: 28.3 % — ABNORMAL LOW (ref 36.0–46.0)
Hemoglobin: 9.7 g/dL — ABNORMAL LOW (ref 12.0–15.0)
Immature Granulocytes: 1 %
Lymphocytes Relative: 26 %
Lymphs Abs: 0.9 10*3/uL (ref 0.7–4.0)
MCH: 31.6 pg (ref 26.0–34.0)
MCHC: 34.3 g/dL (ref 30.0–36.0)
MCV: 92.2 fL (ref 80.0–100.0)
Monocytes Absolute: 0.4 10*3/uL (ref 0.1–1.0)
Monocytes Relative: 10 %
Neutro Abs: 2.1 10*3/uL (ref 1.7–7.7)
Neutrophils Relative %: 59 %
Platelet Count: 217 10*3/uL (ref 150–400)
RBC: 3.07 MIL/uL — ABNORMAL LOW (ref 3.87–5.11)
RDW: 13.4 % (ref 11.5–15.5)
WBC Count: 3.5 10*3/uL — ABNORMAL LOW (ref 4.0–10.5)
nRBC: 0 % (ref 0.0–0.2)

## 2021-07-30 LAB — LACTATE DEHYDROGENASE: LDH: 146 U/L (ref 98–192)

## 2021-07-30 MED ORDER — FAMOTIDINE 20 MG PO TABS
20.0000 mg | ORAL_TABLET | Freq: Once | ORAL | Status: AC
  Administered 2021-07-30: 20 mg via ORAL
  Filled 2021-07-30: qty 1

## 2021-07-30 MED ORDER — ONDANSETRON HCL 8 MG PO TABS
12.0000 mg | ORAL_TABLET | Freq: Once | ORAL | Status: AC
  Administered 2021-07-30: 12 mg via ORAL
  Filled 2021-07-30: qty 2

## 2021-07-30 MED ORDER — BORTEZOMIB CHEMO SQ INJECTION 3.5 MG (2.5MG/ML)
1.5000 mg/m2 | Freq: Once | INTRAMUSCULAR | Status: AC
  Administered 2021-07-30: 2.25 mg via SUBCUTANEOUS
  Filled 2021-07-30: qty 0.9

## 2021-07-30 MED ORDER — DEXAMETHASONE 4 MG PO TABS
20.0000 mg | ORAL_TABLET | Freq: Once | ORAL | Status: AC
  Administered 2021-07-30: 20 mg via ORAL
  Filled 2021-07-30: qty 5

## 2021-07-30 MED ORDER — ACETAMINOPHEN 325 MG PO TABS
650.0000 mg | ORAL_TABLET | Freq: Once | ORAL | Status: AC
  Administered 2021-07-30: 650 mg via ORAL
  Filled 2021-07-30: qty 2

## 2021-07-30 NOTE — Patient Instructions (Signed)
[facility]  Discharge Instructions: Thank you for choosing [facility] to provide your oncology and hematology care.   If you have a lab appointment with the Cancer Center, please go directly to the Cancer Center and check in at the registration area.   Wear comfortable clothing and clothing appropriate for easy access to any Portacath or PICC line.   We strive to give you quality time with your provider. You may need to reschedule your appointment if you arrive late (15 or more minutes).  Arriving late affects you and other patients whose appointments are after yours.  Also, if you miss three or more appointments without notifying the office, you may be dismissed from the clinic at the provider's discretion.      For prescription refill requests, have your pharmacy contact our office and allow 72 hours for refills to be completed.    Today you received the following chemotherapy and/or immunotherapy agents Velcade       To help prevent nausea and vomiting after your treatment, we encourage you to take your nausea medication as directed.  BELOW ARE SYMPTOMS THAT SHOULD BE REPORTED IMMEDIATELY: *FEVER GREATER THAN 100.4 F (38 C) OR HIGHER *CHILLS OR SWEATING *NAUSEA AND VOMITING THAT IS NOT CONTROLLED WITH YOUR NAUSEA MEDICATION *UNUSUAL SHORTNESS OF BREATH *UNUSUAL BRUISING OR BLEEDING *URINARY PROBLEMS (pain or burning when urinating, or frequent urination) *BOWEL PROBLEMS (unusual diarrhea, constipation, pain near the anus) TENDERNESS IN MOUTH AND THROAT WITH OR WITHOUT PRESENCE OF ULCERS (sore throat, sores in mouth, or a toothache) UNUSUAL RASH, SWELLING OR PAIN  UNUSUAL VAGINAL DISCHARGE OR ITCHING   Items with * indicate a potential emergency and should be followed up as soon as possible or go to the Emergency Department if any problems should occur.  Please show the CHEMOTHERAPY ALERT CARD or IMMUNOTHERAPY ALERT CARD at check-in to  the Emergency Department and triage nurse.  Should you have questions after your visit or need to cancel or reschedule your appointment, please contact [facility]  Dept: [phone]  and follow the prompts.  Office hours are 8:00 a.m. to 4:30 p.m. Monday - Friday. Please note that voicemails left after 4:00 p.m. may not be returned until the following business day.  We are closed weekends and major holidays. You have access to a nurse at all times for urgent questions. Please call the main number to the clinic Dept: [phone] and follow the prompts.   For any non-urgent questions, you may also contact your provider using MyChart. We now offer e-Visits for anyone 18 and older to request care online for non-urgent symptoms. For details visit [email or web address].   Also download the MyChart app! Go to the app store, search "MyChart", open the app, select [facility], and log in with your MyChart username and password.  Due to Covid, a mask is required upon entering the hospital/clinic. If you do not have a mask, one will be given to you upon arrival. For doctor visits, patients may have 1 support person aged 18 or older with them. For treatment visits, patients cannot have anyone with them due to current Covid guidelines and our immunocompromised population.   

## 2021-08-02 ENCOUNTER — Telehealth: Payer: Self-pay | Admitting: Hematology

## 2021-08-02 NOTE — Telephone Encounter (Signed)
Rescheduled upcoming appointment per charge nurse's request due to overbooked infusion room. Patient is aware of changes. ?

## 2021-08-05 ENCOUNTER — Encounter: Payer: Self-pay | Admitting: Hematology

## 2021-08-05 NOTE — Progress Notes (Signed)
. ? ? ?HEMATOLOGY/ONCOLOGY CLINIC NOTE ? ?Date of Service: .07/30/2021 ? ? ?Patient Care Team: ?Ginger Organ., MD as PCP - General (Internal Medicine) ?Minus Breeding, MD as PCP - Cardiology (Cardiology) ? 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 ? ?HISTORY OF PRESENTING ILLNESS:  ? ?Please see previous note for details on initial presentation ? ?INTERVAL HISTORY ? ?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 ?She notes no acute new symptoms since her last clinic visit.  Couple of days of nausea which is controlled with medications.  Notes that she is feeling less anxious and having less insomnia with the decreased dose of dexamethasone to 20 mg weekly. ?No symptoms suggestive of neuropathy. ?No infection issues. ?No other acute new symptoms. ?Labs done today were reviewed in detail. ? ?MEDICAL HISTORY:  ?Past Medical History:  ?Diagnosis Date  ? ASHD (arteriosclerotic heart disease)   ? Mild septal hypertrophy, 2D ECHO 11/2006, Dr. Percival Spanish  ? Breast cancer ([place])   ? PMH of, Dr. Marylene Buerger, Dr. Truddie Coco  multilpe sites overlaping, ER +  ? Facet syndrome, lumbar   ? Dr. Lynann Bologna  ? Heart murmur   ? no problems  ? History of COVID-19 05/07/2019  ? Hyperlipidemia   ? Hyperlipidemia   ? Hypertension   ? Osteopenia   ? Skin cancer, basal cell   ? Dr. Amy Martinique  ? ? ? ?SURGICAL HISTORY: ?Past Surgical History:  ?Procedure Laterality Date  ? CATARACT EXTRACTION Right 06/2020  ? CHOLECYSTECTOMY  about 1993  ? COLONOSCOPY    ?  X 3 negative (last 2008, due 2015), Dr. Olevia Perches  ? MASTECTOMY Left 11/2004  ? Dr. Marylene Buerger, also reduction of right breast and reconstruction on the left.  ? ROTATOR CUFF REPAIR Left   ? TONSILLECTOMY    ? TRANSESOPHAGEAL  ECHOCARDIOGRAM  2012  ? Overall left ventricular systolic function was normal. Left ventricular ejection fraction was estimated, range being 55% to 60%. There were no left ventricular regional wall motional abnormalities. Left ventricular wall thickness was mildly increased. There was mild focal basal septal hypertrophy.  ? WISDOM TOOTH EXTRACTION    ? ? ?SOCIAL HISTORY: ?Social History  ? ?Socioeconomic History  ? Marital status: Married  ?  Spouse name: Not on file  ? Number of children: Not on file  ? Years of education: Not on file  ? Highest education level: Not on file  ?Occupational History  ? Occupation: Retired  ?  Employer: UNEMPLOYED  ?Tobacco Use  ? Smoking status: Former  ?  Packs/day: 1.00  ?  Years: 10.00  ?  Pack years: 10.00  ?  Types: Cigarettes  ?  Quit date: 05/06/1978  ?  Years since quitting: 43.2  ? Smokeless tobacco: Never  ? Tobacco comments:  ?  smoked [place], up to 1 ppd  ?Vaping Use  ? Vaping Use: Never used  ?Substance and Sexual Activity  ? Alcohol use: Yes  ?  Alcohol/week: 4.0 standard drinks  ?  Types: 4 Glasses of wine per week  ? Drug use: No  ? Sexual activity: Yes  ?  Partners: Male  ?  Birth control/protection: Post-menopausal  ?Other Topics Concern  ? Not on file  ?Social History Narrative  ? Married  ? Regular exercise: Yes, high  level CVE, walks 2 mpd 5x/ week & gym  ? No specific diet  ? ?Social Determinants of Health  ? ?Financial Resource Strain: Not on file  ?Food Insecurity: Not on file  ?Transportation Needs: Not on file  ?Physical Activity: Not on file  ?Stress: Not on file  ?Social Connections: Not on file  ?Intimate Partner Violence: Not on file  ? ? ?FAMILY HISTORY: ?Family History  ?Problem Relation Age of Onset  ? Hypertension Mother   ? Coronary artery disease Mother   ?     no MI  ? Lung cancer Mother   ?     smoker  ? Colon cancer Father 60  ? Coronary artery disease Father   ?     no MI  ? Prostate cancer Father   ? Diabetes Father   ? Hypertension Sister    ? Cancer Maternal Grandfather   ?     GI  ? Diabetes Paternal Grandfather   ? Stroke Neg Hx   ? Esophageal cancer Neg Hx   ? Stomach cancer Neg Hx   ? Rectal cancer Neg Hx   ? ? ?ALLERGIES:  is allergic to shrimp [shellfish allergy], hydrocodone, and vicodin [hydrocodone-acetaminophen]. ? ?MEDICATIONS:  ?Current Outpatient Medications  ?Medication Sig Dispense Refill  ? acyclovir (ZOVIRAX) 400 MG tablet Take 1 tablet (400 mg total) by mouth 2 (two) times daily. 30 tablet 6  ? amLODipine (NORVASC) 10 MG tablet Take 1 tablet (10 mg total) by mouth daily. 30 tablet 0  ? benazepril (LOTENSIN) 40 MG tablet Take 1 tablet (40 mg total) by mouth daily. 30 tablet 0  ? cyclophosphamide (CYTOXAN) 50 MG capsule Take 10 capsules (500 mg total) by mouth once a week. Take with food to minimize GI upset. Take early in the day and maintain hydration. (Bring to cancer center and take with Velcade injection after nausea medications). 40 capsule 2  ? cyclophosphamide (CYTOXAN) 50 MG capsule Take 10 capsules (500 mg total) by mouth daily. Take with food to minimize GI upset. Take early in the day and maintain hydration. 40 capsule 2  ? denosumab (PROLIA) 60 MG/ML SOLN injection Inject 60 mg into the skin every 6 (six) months. Administer in upper arm, thigh, or abdomen    ? iron polysaccharides (NIFEREX) 150 MG capsule Take 1 capsule (150 mg total) by mouth daily. 30 capsule 2  ? LORazepam (ATIVAN) 0.5 MG tablet Take 1 tablet (0.5 mg total) by mouth every 6 (six) hours as needed (Nausea or vomiting). 30 tablet 0  ? ondansetron (ZOFRAN) 8 MG tablet Take 8 mg by mouth 30 to 60 min prior to Cytoxan administration then take 8 mg twice daily as needed for nausea and vomiting. 30 tablet 1  ? prochlorperazine (COMPAZINE) 10 MG tablet Take 1 tablet (10 mg total) by mouth every 6 (six) hours as needed (Nausea or vomiting). 30 tablet 1  ? rosuvastatin (CRESTOR) 20 MG tablet 1 tablet    ? ?No current facility-administered medications for this  visit.  ? ? ?REVIEW OF SYSTEMS:   ?10 Point review of Systems was done is negative except as noted above. ? ?PHYSICAL EXAMINATION: ?.BP 112/74 (BP Location: Left Arm, Patient Position: Sitting)   Pulse 69   Temp 98.2 ?F (36.8 ?C) (Temporal)   Resp 15   Ht 5' 3"  (1.6 m)   Wt 119 lb 8 oz (54.2 kg)   LMP 05/07/1999 (LMP Unknown)   SpO2 100%   BMI 21.17 kg/m?  ?  NAD ?GENERAL:alert, in no acute distress and comfortable ?SKIN: no acute rashes, no significant lesions ?EYES: conjunctiva are pink and non-injected, sclera anicteric ?OROPHARYNX: MMM, no exudates, no oropharyngeal erythema or ulceration ?NECK: supple, no JVD ?LYMPH:  no palpable lymphadenopathy in the cervical, axillary or inguinal regions ?LUNGS: clear to auscultation b/l with normal respiratory effort ?HEART: regular rate & rhythm ?ABDOMEN:  normoactive bowel sounds , non tender, not distended. ?Extremity: no pedal edema ?PSYCH: alert & oriented x 3 with fluent speech ?NEURO: no focal motor/sensory deficits ? ?LABORATORY DATA:  ?I have reviewed the data as listed ? ?. ? ?  Latest Ref Rng & Units 07/30/2021  ?  8:27 AM 07/23/2021  ? 12:03 PM 07/16/2021  ?  9:09 AM  ?CBC  ?WBC 4.0 - 10.5 K/uL 3.5   4.4   4.9    ?Hemoglobin 12.0 - 15.0 g/dL 9.7   9.7   10.7    ?Hematocrit 36.0 - 46.0 % 28.3   28.5   32.1    ?Platelets 150 - 400 K/uL 217   224   221    ?Marland KitchenCBC ?   ?Component Value Date/Time  ? WBC 3.5 (L) 07/30/2021 0827  ? WBC 4.7 03/12/2021 0730  ? RBC 3.07 (L) 07/30/2021 0827  ? HGB 9.7 (L) 07/30/2021 0827  ? HCT 28.3 (L) 07/30/2021 0827  ? PLT 217 07/30/2021 0827  ? MCV 92.2 07/30/2021 0827  ? MCH 31.6 07/30/2021 0827  ? MCHC 34.3 07/30/2021 0827  ? RDW 13.4 07/30/2021 0827  ? LYMPHSABS 0.9 07/30/2021 0827  ? MONOABS 0.4 07/30/2021 0827  ? EOSABS 0.1 07/30/2021 0827  ? BASOSABS 0.0 07/30/2021 0827  ? ? ? ?  Latest Ref Rng & Units 07/30/2021  ?  8:27 AM 07/23/2021  ? 12:03 PM 07/16/2021  ?  9:09 AM  ?CMP  ?Glucose 70 - 99 mg/dL 97   108   98    ?BUN 8 - 23  mg/dL 39   33   37    ?Creatinine 0.44 - 1.00 mg/dL 1.29   1.26   1.24    ?Sodium 135 - 145 mmol/L 137   137   137    ?Potassium 3.5 - 5.1 mmol/L 3.8   3.4   4.4    ?Chloride 98 - 111 mmol/L 102   102   103    ?CO2 22 - 32 mmol/

## 2021-08-06 ENCOUNTER — Other Ambulatory Visit: Payer: Self-pay

## 2021-08-06 ENCOUNTER — Other Ambulatory Visit: Payer: Self-pay | Admitting: Hematology

## 2021-08-06 ENCOUNTER — Inpatient Hospital Stay: Payer: Medicare Other | Attending: Hematology

## 2021-08-06 ENCOUNTER — Other Ambulatory Visit: Payer: Medicare Other

## 2021-08-06 ENCOUNTER — Ambulatory Visit: Payer: Medicare Other

## 2021-08-06 ENCOUNTER — Inpatient Hospital Stay: Payer: Medicare Other

## 2021-08-06 VITALS — BP 119/78 | HR 68 | Temp 97.6°F | Resp 15 | Ht 63.0 in | Wt 119.8 lb

## 2021-08-06 DIAGNOSIS — Z853 Personal history of malignant neoplasm of breast: Secondary | ICD-10-CM | POA: Diagnosis not present

## 2021-08-06 DIAGNOSIS — Z7189 Other specified counseling: Secondary | ICD-10-CM

## 2021-08-06 DIAGNOSIS — C9000 Multiple myeloma not having achieved remission: Secondary | ICD-10-CM | POA: Diagnosis not present

## 2021-08-06 DIAGNOSIS — R803 Bence Jones proteinuria: Secondary | ICD-10-CM | POA: Diagnosis not present

## 2021-08-06 DIAGNOSIS — Z5112 Encounter for antineoplastic immunotherapy: Secondary | ICD-10-CM | POA: Insufficient documentation

## 2021-08-06 DIAGNOSIS — Z79899 Other long term (current) drug therapy: Secondary | ICD-10-CM | POA: Diagnosis not present

## 2021-08-06 DIAGNOSIS — D8989 Other specified disorders involving the immune mechanism, not elsewhere classified: Secondary | ICD-10-CM

## 2021-08-06 DIAGNOSIS — D649 Anemia, unspecified: Secondary | ICD-10-CM | POA: Insufficient documentation

## 2021-08-06 DIAGNOSIS — E559 Vitamin D deficiency, unspecified: Secondary | ICD-10-CM | POA: Diagnosis not present

## 2021-08-06 LAB — CBC WITH DIFFERENTIAL (CANCER CENTER ONLY)
Abs Immature Granulocytes: 0.05 10*3/uL (ref 0.00–0.07)
Basophils Absolute: 0.1 10*3/uL (ref 0.0–0.1)
Basophils Relative: 1 %
Eosinophils Absolute: 0.2 10*3/uL (ref 0.0–0.5)
Eosinophils Relative: 3 %
HCT: 29.6 % — ABNORMAL LOW (ref 36.0–46.0)
Hemoglobin: 9.9 g/dL — ABNORMAL LOW (ref 12.0–15.0)
Immature Granulocytes: 1 %
Lymphocytes Relative: 20 %
Lymphs Abs: 1 10*3/uL (ref 0.7–4.0)
MCH: 31 pg (ref 26.0–34.0)
MCHC: 33.4 g/dL (ref 30.0–36.0)
MCV: 92.8 fL (ref 80.0–100.0)
Monocytes Absolute: 0.5 10*3/uL (ref 0.1–1.0)
Monocytes Relative: 9 %
Neutro Abs: 3.2 10*3/uL (ref 1.7–7.7)
Neutrophils Relative %: 66 %
Platelet Count: 245 10*3/uL (ref 150–400)
RBC: 3.19 MIL/uL — ABNORMAL LOW (ref 3.87–5.11)
RDW: 13.8 % (ref 11.5–15.5)
WBC Count: 5 10*3/uL (ref 4.0–10.5)
nRBC: 0 % (ref 0.0–0.2)

## 2021-08-06 LAB — CMP (CANCER CENTER ONLY)
ALT: 35 U/L (ref 0–44)
AST: 34 U/L (ref 15–41)
Albumin: 4.4 g/dL (ref 3.5–5.0)
Alkaline Phosphatase: 39 U/L (ref 38–126)
Anion gap: 8 (ref 5–15)
BUN: 38 mg/dL — ABNORMAL HIGH (ref 8–23)
CO2: 29 mmol/L (ref 22–32)
Calcium: 9.6 mg/dL (ref 8.9–10.3)
Chloride: 104 mmol/L (ref 98–111)
Creatinine: 1.2 mg/dL — ABNORMAL HIGH (ref 0.44–1.00)
GFR, Estimated: 48 mL/min — ABNORMAL LOW (ref >60)
Glucose, Bld: 102 mg/dL — ABNORMAL HIGH (ref 70–99)
Potassium: 3.8 mmol/L (ref 3.5–5.1)
Sodium: 141 mmol/L (ref 135–145)
Total Bilirubin: 0.3 mg/dL (ref 0.3–1.2)
Total Protein: 6.9 g/dL (ref 6.5–8.1)

## 2021-08-06 LAB — LACTATE DEHYDROGENASE: LDH: 162 U/L (ref 98–192)

## 2021-08-06 MED ORDER — ACETAMINOPHEN 325 MG PO TABS
650.0000 mg | ORAL_TABLET | Freq: Once | ORAL | Status: AC
  Administered 2021-08-06: 650 mg via ORAL
  Filled 2021-08-06: qty 2

## 2021-08-06 MED ORDER — DEXAMETHASONE 4 MG PO TABS
20.0000 mg | ORAL_TABLET | Freq: Once | ORAL | Status: AC
  Administered 2021-08-06: 20 mg via ORAL
  Filled 2021-08-06: qty 5

## 2021-08-06 MED ORDER — FAMOTIDINE 20 MG PO TABS
20.0000 mg | ORAL_TABLET | Freq: Once | ORAL | Status: AC
  Administered 2021-08-06: 20 mg via ORAL
  Filled 2021-08-06: qty 1

## 2021-08-06 MED ORDER — ONDANSETRON HCL 8 MG PO TABS
12.0000 mg | ORAL_TABLET | Freq: Once | ORAL | Status: AC
  Administered 2021-08-06: 12 mg via ORAL
  Filled 2021-08-06: qty 2

## 2021-08-06 MED ORDER — BORTEZOMIB CHEMO SQ INJECTION 3.5 MG (2.5MG/ML)
1.5000 mg/m2 | Freq: Once | INTRAMUSCULAR | Status: AC
  Administered 2021-08-06: 2.25 mg via SUBCUTANEOUS
  Filled 2021-08-06: qty 0.9

## 2021-08-06 NOTE — Patient Instructions (Signed)
[facility]  Discharge Instructions: Thank you for choosing [facility] to provide your oncology and hematology care.   If you have a lab appointment with the Cancer Center, please go directly to the Cancer Center and check in at the registration area.   Wear comfortable clothing and clothing appropriate for easy access to any Portacath or PICC line.   We strive to give you quality time with your provider. You may need to reschedule your appointment if you arrive late (15 or more minutes).  Arriving late affects you and other patients whose appointments are after yours.  Also, if you miss three or more appointments without notifying the office, you may be dismissed from the clinic at the provider's discretion.      For prescription refill requests, have your pharmacy contact our office and allow 72 hours for refills to be completed.    Today you received the following chemotherapy and/or immunotherapy agents Velcade       To help prevent nausea and vomiting after your treatment, we encourage you to take your nausea medication as directed.  BELOW ARE SYMPTOMS THAT SHOULD BE REPORTED IMMEDIATELY: *FEVER GREATER THAN 100.4 F (38 C) OR HIGHER *CHILLS OR SWEATING *NAUSEA AND VOMITING THAT IS NOT CONTROLLED WITH YOUR NAUSEA MEDICATION *UNUSUAL SHORTNESS OF BREATH *UNUSUAL BRUISING OR BLEEDING *URINARY PROBLEMS (pain or burning when urinating, or frequent urination) *BOWEL PROBLEMS (unusual diarrhea, constipation, pain near the anus) TENDERNESS IN MOUTH AND THROAT WITH OR WITHOUT PRESENCE OF ULCERS (sore throat, sores in mouth, or a toothache) UNUSUAL RASH, SWELLING OR PAIN  UNUSUAL VAGINAL DISCHARGE OR ITCHING   Items with * indicate a potential emergency and should be followed up as soon as possible or go to the Emergency Department if any problems should occur.  Please show the CHEMOTHERAPY ALERT CARD or IMMUNOTHERAPY ALERT CARD at check-in to  the Emergency Department and triage nurse.  Should you have questions after your visit or need to cancel or reschedule your appointment, please contact [facility]  Dept: [phone]  and follow the prompts.  Office hours are 8:00 a.m. to 4:30 p.m. Monday - Friday. Please note that voicemails left after 4:00 p.m. may not be returned until the following business day.  We are closed weekends and major holidays. You have access to a nurse at all times for urgent questions. Please call the main number to the clinic Dept: [phone] and follow the prompts.   For any non-urgent questions, you may also contact your provider using MyChart. We now offer e-Visits for anyone 18 and older to request care online for non-urgent symptoms. For details visit [email or web address].   Also download the MyChart app! Go to the app store, search "MyChart", open the app, select [facility], and log in with your MyChart username and password.  Due to Covid, a mask is required upon entering the hospital/clinic. If you do not have a mask, one will be given to you upon arrival. For doctor visits, patients may have 1 support person aged 18 or older with them. For treatment visits, patients cannot have anyone with them due to current Covid guidelines and our immunocompromised population.   

## 2021-08-06 NOTE — Progress Notes (Signed)
This RN witnessed Pt take PO Cytoxan in inf today.  ?RN verified dose of '500mg'$  (10 pills) with second RN, Dacia H prior to Pt taking medication.  ?

## 2021-08-07 LAB — KAPPA/LAMBDA LIGHT CHAINS
Kappa free light chain: 13.8 mg/L (ref 3.3–19.4)
Kappa, lambda light chain ratio: 0.05 — ABNORMAL LOW (ref 0.26–1.65)
Lambda free light chains: 292.8 mg/L — ABNORMAL HIGH (ref 5.7–26.3)

## 2021-08-08 LAB — MULTIPLE MYELOMA PANEL, SERUM
Albumin SerPl Elph-Mcnc: 4 g/dL (ref 2.9–4.4)
Albumin/Glob SerPl: 2 — ABNORMAL HIGH (ref 0.7–1.7)
Alpha 1: 0.2 g/dL (ref 0.0–0.4)
Alpha2 Glob SerPl Elph-Mcnc: 0.7 g/dL (ref 0.4–1.0)
B-Globulin SerPl Elph-Mcnc: 0.8 g/dL (ref 0.7–1.3)
Gamma Glob SerPl Elph-Mcnc: 0.4 g/dL (ref 0.4–1.8)
Globulin, Total: 2.1 g/dL — ABNORMAL LOW (ref 2.2–3.9)
IgA: 64 mg/dL (ref 64–422)
IgG (Immunoglobin G), Serum: 492 mg/dL — ABNORMAL LOW (ref 586–1602)
IgM (Immunoglobulin M), Srm: 55 mg/dL (ref 26–217)
Total Protein ELP: 6.1 g/dL (ref 6.0–8.5)

## 2021-08-13 ENCOUNTER — Other Ambulatory Visit: Payer: Self-pay

## 2021-08-13 ENCOUNTER — Inpatient Hospital Stay: Payer: Medicare Other

## 2021-08-13 ENCOUNTER — Ambulatory Visit: Payer: Medicare Other

## 2021-08-13 ENCOUNTER — Other Ambulatory Visit: Payer: Medicare Other

## 2021-08-13 VITALS — BP 102/68 | HR 65 | Temp 97.9°F | Resp 16 | Wt 119.0 lb

## 2021-08-13 DIAGNOSIS — Z5112 Encounter for antineoplastic immunotherapy: Secondary | ICD-10-CM | POA: Diagnosis not present

## 2021-08-13 DIAGNOSIS — C9000 Multiple myeloma not having achieved remission: Secondary | ICD-10-CM | POA: Diagnosis not present

## 2021-08-13 DIAGNOSIS — E559 Vitamin D deficiency, unspecified: Secondary | ICD-10-CM | POA: Diagnosis not present

## 2021-08-13 DIAGNOSIS — Z7189 Other specified counseling: Secondary | ICD-10-CM

## 2021-08-13 DIAGNOSIS — D649 Anemia, unspecified: Secondary | ICD-10-CM | POA: Diagnosis not present

## 2021-08-13 DIAGNOSIS — D8989 Other specified disorders involving the immune mechanism, not elsewhere classified: Secondary | ICD-10-CM

## 2021-08-13 DIAGNOSIS — Z79899 Other long term (current) drug therapy: Secondary | ICD-10-CM | POA: Diagnosis not present

## 2021-08-13 DIAGNOSIS — R803 Bence Jones proteinuria: Secondary | ICD-10-CM | POA: Diagnosis not present

## 2021-08-13 LAB — CMP (CANCER CENTER ONLY)
ALT: 27 U/L (ref 0–44)
AST: 29 U/L (ref 15–41)
Albumin: 4.2 g/dL (ref 3.5–5.0)
Alkaline Phosphatase: 36 U/L — ABNORMAL LOW (ref 38–126)
Anion gap: 6 (ref 5–15)
BUN: 33 mg/dL — ABNORMAL HIGH (ref 8–23)
CO2: 30 mmol/L (ref 22–32)
Calcium: 9.4 mg/dL (ref 8.9–10.3)
Chloride: 103 mmol/L (ref 98–111)
Creatinine: 1.22 mg/dL — ABNORMAL HIGH (ref 0.44–1.00)
GFR, Estimated: 47 mL/min — ABNORMAL LOW (ref >60)
Glucose, Bld: 100 mg/dL — ABNORMAL HIGH (ref 70–99)
Potassium: 3.5 mmol/L (ref 3.5–5.1)
Sodium: 139 mmol/L (ref 135–145)
Total Bilirubin: 0.4 mg/dL (ref 0.3–1.2)
Total Protein: 6.5 g/dL (ref 6.5–8.1)

## 2021-08-13 LAB — CBC WITH DIFFERENTIAL (CANCER CENTER ONLY)
Abs Immature Granulocytes: 0.03 10*3/uL (ref 0.00–0.07)
Basophils Absolute: 0 10*3/uL (ref 0.0–0.1)
Basophils Relative: 1 %
Eosinophils Absolute: 0.1 10*3/uL (ref 0.0–0.5)
Eosinophils Relative: 3 %
HCT: 27.3 % — ABNORMAL LOW (ref 36.0–46.0)
Hemoglobin: 9.5 g/dL — ABNORMAL LOW (ref 12.0–15.0)
Immature Granulocytes: 1 %
Lymphocytes Relative: 20 %
Lymphs Abs: 0.8 10*3/uL (ref 0.7–4.0)
MCH: 31.9 pg (ref 26.0–34.0)
MCHC: 34.8 g/dL (ref 30.0–36.0)
MCV: 91.6 fL (ref 80.0–100.0)
Monocytes Absolute: 0.3 10*3/uL (ref 0.1–1.0)
Monocytes Relative: 9 %
Neutro Abs: 2.7 10*3/uL (ref 1.7–7.7)
Neutrophils Relative %: 66 %
Platelet Count: 201 10*3/uL (ref 150–400)
RBC: 2.98 MIL/uL — ABNORMAL LOW (ref 3.87–5.11)
RDW: 14.2 % (ref 11.5–15.5)
WBC Count: 4 10*3/uL (ref 4.0–10.5)
nRBC: 0 % (ref 0.0–0.2)

## 2021-08-13 LAB — LACTATE DEHYDROGENASE: LDH: 165 U/L (ref 98–192)

## 2021-08-13 MED ORDER — ACETAMINOPHEN 325 MG PO TABS
650.0000 mg | ORAL_TABLET | Freq: Once | ORAL | Status: AC
  Administered 2021-08-13: 650 mg via ORAL
  Filled 2021-08-13: qty 2

## 2021-08-13 MED ORDER — BORTEZOMIB CHEMO SQ INJECTION 3.5 MG (2.5MG/ML)
1.5000 mg/m2 | Freq: Once | INTRAMUSCULAR | Status: AC
  Administered 2021-08-13: 2.25 mg via SUBCUTANEOUS
  Filled 2021-08-13: qty 0.9

## 2021-08-13 MED ORDER — ONDANSETRON HCL 8 MG PO TABS
12.0000 mg | ORAL_TABLET | Freq: Once | ORAL | Status: AC
  Administered 2021-08-13: 12 mg via ORAL
  Filled 2021-08-13: qty 2

## 2021-08-13 MED ORDER — FAMOTIDINE 20 MG PO TABS
20.0000 mg | ORAL_TABLET | Freq: Once | ORAL | Status: AC
  Administered 2021-08-13: 20 mg via ORAL
  Filled 2021-08-13: qty 1

## 2021-08-13 MED ORDER — DEXAMETHASONE 4 MG PO TABS
20.0000 mg | ORAL_TABLET | Freq: Once | ORAL | Status: AC
  Administered 2021-08-13: 20 mg via ORAL
  Filled 2021-08-13: qty 5

## 2021-08-13 NOTE — Patient Instructions (Signed)
[facility]   ?Discharge Instructions: ?Thank you for choosing [facility] to provide your oncology and hematology care.  ? ?If you have a lab appointment with the Cancer Center, please go directly to the Cancer Center and check in at the registration area. ?  ?Wear comfortable clothing and clothing appropriate for easy access to any Portacath or PICC line.  ? ?We strive to give you quality time with your provider. You may need to reschedule your appointment if you arrive late (15 or more minutes).  Arriving late affects you and other patients whose appointments are after yours.  Also, if you miss three or more appointments without notifying the office, you may be dismissed from the clinic at the provider?s discretion.    ?  ?For prescription refill requests, have your pharmacy contact our office and allow 72 hours for refills to be completed.   ? ?Today you received the following chemotherapy and/or immunotherapy agents: bortezomib    ?  ?To help prevent nausea and vomiting after your treatment, we encourage you to take your nausea medication as directed. ? ?BELOW ARE SYMPTOMS THAT SHOULD BE REPORTED IMMEDIATELY: ?*FEVER GREATER THAN 100.4 F (38 ?C) OR HIGHER ?*CHILLS OR SWEATING ?*NAUSEA AND VOMITING THAT IS NOT CONTROLLED WITH YOUR NAUSEA MEDICATION ?*UNUSUAL SHORTNESS OF BREATH ?*UNUSUAL BRUISING OR BLEEDING ?*URINARY PROBLEMS (pain or burning when urinating, or frequent urination) ?*BOWEL PROBLEMS (unusual diarrhea, constipation, pain near the anus) ?TENDERNESS IN MOUTH AND THROAT WITH OR WITHOUT PRESENCE OF ULCERS (sore throat, sores in mouth, or a toothache) ?UNUSUAL RASH, SWELLING OR PAIN  ?UNUSUAL VAGINAL DISCHARGE OR ITCHING  ? ?Items with * indicate a potential emergency and should be followed up as soon as possible or go to the Emergency Department if any problems should occur. ? ?Please show the CHEMOTHERAPY ALERT CARD or IMMUNOTHERAPY ALERT CARD at check-in  to the Emergency Department and triage nurse. ? ?Should you have questions after your visit or need to cancel or reschedule your appointment, please contact [facility]  Dept: [phone]  and follow the prompts.  Office hours are 8:00 a.m. to 4:30 p.m. Monday - Friday. Please note that voicemails left after 4:00 p.m. may not be returned until the following business day.  We are closed weekends and major holidays. You have access to a nurse at all times for urgent questions. Please call the main number to the clinic Dept: [phone] and follow the prompts. ? ? ?For any non-urgent questions, you may also contact your provider using MyChart. We now offer e-Visits for anyone 18 and older to request care online for non-urgent symptoms. For details visit [email or web address]. ?  ?Also download the MyChart app! Go to the app store, search "MyChart", open the app, select [facility], and log in with your MyChart username and password. ? ?Due to Covid, a mask is required upon entering the hospital/clinic. If you do not have a mask, one will be given to you upon arrival. For doctor visits, patients may have 1 support person aged 18 or older with them. For treatment visits, patients cannot have anyone with them due to current Covid guidelines and our immunocompromised population.  ? ?

## 2021-08-13 NOTE — Progress Notes (Signed)
Cytoxan '500mg'$  (10 capsules) administered from home supply at 1256. Verified with Maygan, RN.  ?

## 2021-08-14 DIAGNOSIS — Z6821 Body mass index (BMI) 21.0-21.9, adult: Secondary | ICD-10-CM | POA: Diagnosis not present

## 2021-08-14 DIAGNOSIS — D7589 Other specified diseases of blood and blood-forming organs: Secondary | ICD-10-CM | POA: Diagnosis not present

## 2021-08-14 DIAGNOSIS — N183 Chronic kidney disease, stage 3 unspecified: Secondary | ICD-10-CM | POA: Diagnosis not present

## 2021-08-14 DIAGNOSIS — I10 Essential (primary) hypertension: Secondary | ICD-10-CM | POA: Diagnosis not present

## 2021-08-16 ENCOUNTER — Other Ambulatory Visit (HOSPITAL_COMMUNITY): Payer: Self-pay

## 2021-08-20 ENCOUNTER — Other Ambulatory Visit: Payer: Self-pay

## 2021-08-20 ENCOUNTER — Ambulatory Visit: Payer: Medicare Other

## 2021-08-20 ENCOUNTER — Inpatient Hospital Stay: Payer: Medicare Other

## 2021-08-20 ENCOUNTER — Other Ambulatory Visit: Payer: Medicare Other

## 2021-08-20 VITALS — BP 113/76 | HR 65 | Temp 97.7°F | Resp 18 | Wt 120.2 lb

## 2021-08-20 DIAGNOSIS — R803 Bence Jones proteinuria: Secondary | ICD-10-CM | POA: Diagnosis not present

## 2021-08-20 DIAGNOSIS — E8809 Other disorders of plasma-protein metabolism, not elsewhere classified: Secondary | ICD-10-CM

## 2021-08-20 DIAGNOSIS — Z7189 Other specified counseling: Secondary | ICD-10-CM

## 2021-08-20 DIAGNOSIS — C9000 Multiple myeloma not having achieved remission: Secondary | ICD-10-CM

## 2021-08-20 DIAGNOSIS — D8989 Other specified disorders involving the immune mechanism, not elsewhere classified: Secondary | ICD-10-CM

## 2021-08-20 DIAGNOSIS — E559 Vitamin D deficiency, unspecified: Secondary | ICD-10-CM | POA: Diagnosis not present

## 2021-08-20 DIAGNOSIS — Z5112 Encounter for antineoplastic immunotherapy: Secondary | ICD-10-CM | POA: Diagnosis not present

## 2021-08-20 DIAGNOSIS — D649 Anemia, unspecified: Secondary | ICD-10-CM

## 2021-08-20 DIAGNOSIS — Z79899 Other long term (current) drug therapy: Secondary | ICD-10-CM | POA: Diagnosis not present

## 2021-08-20 DIAGNOSIS — D696 Thrombocytopenia, unspecified: Secondary | ICD-10-CM

## 2021-08-20 LAB — CMP (CANCER CENTER ONLY)
ALT: 37 U/L (ref 0–44)
AST: 37 U/L (ref 15–41)
Albumin: 4.3 g/dL (ref 3.5–5.0)
Alkaline Phosphatase: 39 U/L (ref 38–126)
Anion gap: 7 (ref 5–15)
BUN: 36 mg/dL — ABNORMAL HIGH (ref 8–23)
CO2: 28 mmol/L (ref 22–32)
Calcium: 9.3 mg/dL (ref 8.9–10.3)
Chloride: 102 mmol/L (ref 98–111)
Creatinine: 1.2 mg/dL — ABNORMAL HIGH (ref 0.44–1.00)
GFR, Estimated: 48 mL/min — ABNORMAL LOW (ref >60)
Glucose, Bld: 112 mg/dL — ABNORMAL HIGH (ref 70–99)
Potassium: 3.3 mmol/L — ABNORMAL LOW (ref 3.5–5.1)
Sodium: 137 mmol/L (ref 135–145)
Total Bilirubin: 0.4 mg/dL (ref 0.3–1.2)
Total Protein: 6.7 g/dL (ref 6.5–8.1)

## 2021-08-20 LAB — CBC WITH DIFFERENTIAL (CANCER CENTER ONLY)
Abs Immature Granulocytes: 0.02 10*3/uL (ref 0.00–0.07)
Basophils Absolute: 0 10*3/uL (ref 0.0–0.1)
Basophils Relative: 0 %
Eosinophils Absolute: 0 10*3/uL (ref 0.0–0.5)
Eosinophils Relative: 1 %
HCT: 27.4 % — ABNORMAL LOW (ref 36.0–46.0)
Hemoglobin: 9.3 g/dL — ABNORMAL LOW (ref 12.0–15.0)
Immature Granulocytes: 1 %
Lymphocytes Relative: 35 %
Lymphs Abs: 0.8 10*3/uL (ref 0.7–4.0)
MCH: 31.4 pg (ref 26.0–34.0)
MCHC: 33.9 g/dL (ref 30.0–36.0)
MCV: 92.6 fL (ref 80.0–100.0)
Monocytes Absolute: 0.3 10*3/uL (ref 0.1–1.0)
Monocytes Relative: 14 %
Neutro Abs: 1.1 10*3/uL — ABNORMAL LOW (ref 1.7–7.7)
Neutrophils Relative %: 49 %
Platelet Count: 182 10*3/uL (ref 150–400)
RBC: 2.96 MIL/uL — ABNORMAL LOW (ref 3.87–5.11)
RDW: 14.5 % (ref 11.5–15.5)
WBC Count: 2.2 10*3/uL — ABNORMAL LOW (ref 4.0–10.5)
nRBC: 0 % (ref 0.0–0.2)

## 2021-08-20 LAB — LACTATE DEHYDROGENASE: LDH: 176 U/L (ref 98–192)

## 2021-08-20 MED ORDER — FAMOTIDINE 20 MG PO TABS
20.0000 mg | ORAL_TABLET | Freq: Once | ORAL | Status: AC
  Administered 2021-08-20: 20 mg via ORAL
  Filled 2021-08-20: qty 1

## 2021-08-20 MED ORDER — ACETAMINOPHEN 325 MG PO TABS
650.0000 mg | ORAL_TABLET | Freq: Once | ORAL | Status: AC
  Administered 2021-08-20: 650 mg via ORAL
  Filled 2021-08-20: qty 2

## 2021-08-20 MED ORDER — DEXAMETHASONE 4 MG PO TABS
20.0000 mg | ORAL_TABLET | Freq: Once | ORAL | Status: AC
  Administered 2021-08-20: 20 mg via ORAL
  Filled 2021-08-20: qty 5

## 2021-08-20 MED ORDER — BORTEZOMIB CHEMO SQ INJECTION 3.5 MG (2.5MG/ML)
1.5000 mg/m2 | Freq: Once | INTRAMUSCULAR | Status: AC
  Administered 2021-08-20: 2.25 mg via SUBCUTANEOUS
  Filled 2021-08-20: qty 0.9

## 2021-08-20 MED ORDER — ONDANSETRON HCL 8 MG PO TABS
12.0000 mg | ORAL_TABLET | Freq: Once | ORAL | Status: AC
  Administered 2021-08-20: 12 mg via ORAL
  Filled 2021-08-20: qty 2

## 2021-08-20 NOTE — Progress Notes (Signed)
Per Dr. Irene Limbo ok to treat with low ANC ?

## 2021-08-20 NOTE — Patient Instructions (Signed)
[facility]  Discharge Instructions: ?Thank you for choosing [facility] to provide your oncology and hematology care.  ? ?If you have a lab appointment with the [facility], please go directly to the [facility] and check in at the registration area. ?  ?Wear comfortable clothing and clothing appropriate for easy access to any Portacath or PICC line.  ? ?We strive to give you quality time with your provider. You may need to reschedule your appointment if you arrive late (15 or more minutes).  Arriving late affects you and other patients whose appointments are after yours.  Also, if you miss three or more appointments without notifying the office, you may be dismissed from the clinic at the provider?s discretion.    ?  ?For prescription refill requests, have your pharmacy contact our office and allow 72 hours for refills to be completed.   ? ?Today you received the following chemotherapy and/or immunotherapy agents velcade    ?  ?To help prevent nausea and vomiting after your treatment, we encourage you to take your nausea medication as directed. ? ?BELOW ARE SYMPTOMS THAT SHOULD BE REPORTED IMMEDIATELY: ?*FEVER GREATER THAN 100.4 F (38 ?C) OR HIGHER ?*CHILLS OR SWEATING ?*NAUSEA AND VOMITING THAT IS NOT CONTROLLED WITH YOUR NAUSEA MEDICATION ?*UNUSUAL SHORTNESS OF BREATH ?*UNUSUAL BRUISING OR BLEEDING ?*URINARY PROBLEMS (pain or burning when urinating, or frequent urination) ?*BOWEL PROBLEMS (unusual diarrhea, constipation, pain near the anus) ?TENDERNESS IN MOUTH AND THROAT WITH OR WITHOUT PRESENCE OF ULCERS (sore throat, sores in mouth, or a toothache) ?UNUSUAL RASH, SWELLING OR PAIN  ?UNUSUAL VAGINAL DISCHARGE OR ITCHING  ? ?Items with * indicate a potential emergency and should be followed up as soon as possible or go to the Emergency Department if any problems should occur. ? ?Please show the CHEMOTHERAPY ALERT CARD or IMMUNOTHERAPY ALERT CARD at check-in to the  Emergency Department and triage nurse. ? ?Should you have questions after your visit or need to cancel or reschedule your appointment, please contact [facility]  Dept: [phone]  and follow the prompts.  Office hours are 8:00 a.m. to 4:30 p.m. Monday - Friday. Please note that voicemails left after 4:00 p.m. may not be returned until the following business day.  We are closed weekends and major holidays. You have access to a nurse at all times for urgent questions. Please call the main number to the clinic Dept: [phone] and follow the prompts. ? ? ?For any non-urgent questions, you may also contact your provider using MyChart. We now offer e-Visits for anyone 48 and older to request care online for non-urgent symptoms. For details visit [email or web address]. ?  ?Also download the MyChart app! Go to the app store, search "MyChart", open the app, select [facility], and log in with your MyChart username and password. ? ?Due to Covid, a mask is required upon entering the hospital/clinic. If you do not have a mask, one will be given to you upon arrival. For doctor visits, patients may have 1 support person aged 39 or older with them. For treatment visits, patients cannot have anyone with them due to current Covid guidelines and our immunocompromised population.  ? ?

## 2021-08-21 LAB — KAPPA/LAMBDA LIGHT CHAINS
Kappa free light chain: 13 mg/L (ref 3.3–19.4)
Kappa, lambda light chain ratio: 0.04 — ABNORMAL LOW (ref 0.26–1.65)
Lambda free light chains: 343.4 mg/L — ABNORMAL HIGH (ref 5.7–26.3)

## 2021-08-21 LAB — PARATHYROID HORMONE, INTACT (NO CA): PTH: 30 pg/mL (ref 15–65)

## 2021-08-22 ENCOUNTER — Other Ambulatory Visit (HOSPITAL_COMMUNITY): Payer: Self-pay

## 2021-08-22 LAB — MULTIPLE MYELOMA PANEL, SERUM
Albumin SerPl Elph-Mcnc: 3.9 g/dL (ref 2.9–4.4)
Albumin/Glob SerPl: 1.8 — ABNORMAL HIGH (ref 0.7–1.7)
Alpha 1: 0.3 g/dL (ref 0.0–0.4)
Alpha2 Glob SerPl Elph-Mcnc: 0.7 g/dL (ref 0.4–1.0)
B-Globulin SerPl Elph-Mcnc: 0.8 g/dL (ref 0.7–1.3)
Gamma Glob SerPl Elph-Mcnc: 0.5 g/dL (ref 0.4–1.8)
Globulin, Total: 2.2 g/dL (ref 2.2–3.9)
IgA: 58 mg/dL — ABNORMAL LOW (ref 64–422)
IgG (Immunoglobin G), Serum: 474 mg/dL — ABNORMAL LOW (ref 586–1602)
IgM (Immunoglobulin M), Srm: 55 mg/dL (ref 26–217)
Total Protein ELP: 6.1 g/dL (ref 6.0–8.5)

## 2021-08-27 ENCOUNTER — Other Ambulatory Visit: Payer: Self-pay

## 2021-08-27 ENCOUNTER — Inpatient Hospital Stay: Payer: Medicare Other

## 2021-08-27 ENCOUNTER — Other Ambulatory Visit: Payer: Medicare Other

## 2021-08-27 ENCOUNTER — Ambulatory Visit: Payer: Medicare Other

## 2021-08-27 ENCOUNTER — Inpatient Hospital Stay (HOSPITAL_BASED_OUTPATIENT_CLINIC_OR_DEPARTMENT_OTHER): Payer: Medicare Other | Admitting: Hematology

## 2021-08-27 VITALS — BP 127/75 | HR 66 | Temp 98.3°F | Resp 18 | Wt 120.8 lb

## 2021-08-27 DIAGNOSIS — C9000 Multiple myeloma not having achieved remission: Secondary | ICD-10-CM

## 2021-08-27 DIAGNOSIS — E559 Vitamin D deficiency, unspecified: Secondary | ICD-10-CM | POA: Diagnosis not present

## 2021-08-27 DIAGNOSIS — D8989 Other specified disorders involving the immune mechanism, not elsewhere classified: Secondary | ICD-10-CM | POA: Diagnosis not present

## 2021-08-27 DIAGNOSIS — E8809 Other disorders of plasma-protein metabolism, not elsewhere classified: Secondary | ICD-10-CM

## 2021-08-27 DIAGNOSIS — D696 Thrombocytopenia, unspecified: Secondary | ICD-10-CM

## 2021-08-27 DIAGNOSIS — D649 Anemia, unspecified: Secondary | ICD-10-CM | POA: Diagnosis not present

## 2021-08-27 DIAGNOSIS — Z79899 Other long term (current) drug therapy: Secondary | ICD-10-CM | POA: Diagnosis not present

## 2021-08-27 DIAGNOSIS — Z5112 Encounter for antineoplastic immunotherapy: Secondary | ICD-10-CM | POA: Diagnosis not present

## 2021-08-27 DIAGNOSIS — R803 Bence Jones proteinuria: Secondary | ICD-10-CM | POA: Diagnosis not present

## 2021-08-27 DIAGNOSIS — Z7189 Other specified counseling: Secondary | ICD-10-CM

## 2021-08-27 LAB — CBC WITH DIFFERENTIAL/PLATELET
Abs Immature Granulocytes: 0.03 10*3/uL (ref 0.00–0.07)
Basophils Absolute: 0 10*3/uL (ref 0.0–0.1)
Basophils Relative: 1 %
Eosinophils Absolute: 0.1 10*3/uL (ref 0.0–0.5)
Eosinophils Relative: 2 %
HCT: 26.4 % — ABNORMAL LOW (ref 36.0–46.0)
Hemoglobin: 8.9 g/dL — ABNORMAL LOW (ref 12.0–15.0)
Immature Granulocytes: 1 %
Lymphocytes Relative: 16 %
Lymphs Abs: 0.5 10*3/uL — ABNORMAL LOW (ref 0.7–4.0)
MCH: 31.2 pg (ref 26.0–34.0)
MCHC: 33.7 g/dL (ref 30.0–36.0)
MCV: 92.6 fL (ref 80.0–100.0)
Monocytes Absolute: 0.3 10*3/uL (ref 0.1–1.0)
Monocytes Relative: 11 %
Neutro Abs: 2.3 10*3/uL (ref 1.7–7.7)
Neutrophils Relative %: 69 %
Platelets: 187 10*3/uL (ref 150–400)
RBC: 2.85 MIL/uL — ABNORMAL LOW (ref 3.87–5.11)
RDW: 14.7 % (ref 11.5–15.5)
WBC: 3.2 10*3/uL — ABNORMAL LOW (ref 4.0–10.5)
nRBC: 0 % (ref 0.0–0.2)

## 2021-08-27 LAB — CMP (CANCER CENTER ONLY)
ALT: 32 U/L (ref 0–44)
AST: 31 U/L (ref 15–41)
Albumin: 4.3 g/dL (ref 3.5–5.0)
Alkaline Phosphatase: 35 U/L — ABNORMAL LOW (ref 38–126)
Anion gap: 6 (ref 5–15)
BUN: 31 mg/dL — ABNORMAL HIGH (ref 8–23)
CO2: 25 mmol/L (ref 22–32)
Calcium: 9 mg/dL (ref 8.9–10.3)
Chloride: 106 mmol/L (ref 98–111)
Creatinine: 1.19 mg/dL — ABNORMAL HIGH (ref 0.44–1.00)
GFR, Estimated: 49 mL/min — ABNORMAL LOW (ref >60)
Glucose, Bld: 103 mg/dL — ABNORMAL HIGH (ref 70–99)
Potassium: 3.7 mmol/L (ref 3.5–5.1)
Sodium: 137 mmol/L (ref 135–145)
Total Bilirubin: 0.5 mg/dL (ref 0.3–1.2)
Total Protein: 6.5 g/dL (ref 6.5–8.1)

## 2021-08-27 LAB — PHOSPHORUS: Phosphorus: 4.5 mg/dL (ref 2.5–4.6)

## 2021-08-27 LAB — LACTATE DEHYDROGENASE: LDH: 187 U/L (ref 98–192)

## 2021-08-27 MED ORDER — ACETAMINOPHEN 325 MG PO TABS
650.0000 mg | ORAL_TABLET | Freq: Once | ORAL | Status: AC
  Administered 2021-08-27: 650 mg via ORAL
  Filled 2021-08-27: qty 2

## 2021-08-27 MED ORDER — DEXAMETHASONE 4 MG PO TABS
20.0000 mg | ORAL_TABLET | Freq: Once | ORAL | Status: AC
  Administered 2021-08-27: 20 mg via ORAL
  Filled 2021-08-27: qty 5

## 2021-08-27 MED ORDER — BORTEZOMIB CHEMO SQ INJECTION 3.5 MG (2.5MG/ML)
1.5000 mg/m2 | Freq: Once | INTRAMUSCULAR | Status: AC
  Administered 2021-08-27: 2.25 mg via SUBCUTANEOUS
  Filled 2021-08-27: qty 0.9

## 2021-08-27 MED ORDER — ONDANSETRON HCL 8 MG PO TABS
12.0000 mg | ORAL_TABLET | Freq: Once | ORAL | Status: AC
  Administered 2021-08-27: 12 mg via ORAL
  Filled 2021-08-27: qty 2

## 2021-08-27 MED ORDER — FAMOTIDINE 20 MG PO TABS
20.0000 mg | ORAL_TABLET | Freq: Once | ORAL | Status: AC
  Administered 2021-08-27: 20 mg via ORAL
  Filled 2021-08-27: qty 1

## 2021-08-27 NOTE — Patient Instructions (Signed)
[facility]  Discharge Instructions: ?Thank you for choosing [facility] to provide your oncology and hematology care.  ? ?If you have a lab appointment with the [facility], please go directly to the [facility] and check in at the registration area. ?  ?Wear comfortable clothing and clothing appropriate for easy access to any Portacath or PICC line.  ? ?We strive to give you quality time with your provider. You may need to reschedule your appointment if you arrive late (15 or more minutes).  Arriving late affects you and other patients whose appointments are after yours.  Also, if you miss three or more appointments without notifying the office, you may be dismissed from the clinic at the provider?s discretion.    ?  ?For prescription refill requests, have your pharmacy contact our office and allow 72 hours for refills to be completed.   ? ?Today you received the following chemotherapy and/or immunotherapy agents velcade    ?  ?To help prevent nausea and vomiting after your treatment, we encourage you to take your nausea medication as directed. ? ?BELOW ARE SYMPTOMS THAT SHOULD BE REPORTED IMMEDIATELY: ?*FEVER GREATER THAN 100.4 F (38 ?C) OR HIGHER ?*CHILLS OR SWEATING ?*NAUSEA AND VOMITING THAT IS NOT CONTROLLED WITH YOUR NAUSEA MEDICATION ?*UNUSUAL SHORTNESS OF BREATH ?*UNUSUAL BRUISING OR BLEEDING ?*URINARY PROBLEMS (pain or burning when urinating, or frequent urination) ?*BOWEL PROBLEMS (unusual diarrhea, constipation, pain near the anus) ?TENDERNESS IN MOUTH AND THROAT WITH OR WITHOUT PRESENCE OF ULCERS (sore throat, sores in mouth, or a toothache) ?UNUSUAL RASH, SWELLING OR PAIN  ?UNUSUAL VAGINAL DISCHARGE OR ITCHING  ? ?Items with * indicate a potential emergency and should be followed up as soon as possible or go to the Emergency Department if any problems should occur. ? ?Please show the CHEMOTHERAPY ALERT CARD or IMMUNOTHERAPY ALERT CARD at check-in to the  Emergency Department and triage nurse. ? ?Should you have questions after your visit or need to cancel or reschedule your appointment, please contact [facility]  Dept: [phone]  and follow the prompts.  Office hours are 8:00 a.m. to 4:30 p.m. Monday - Friday. Please note that voicemails left after 4:00 p.m. may not be returned until the following business day.  We are closed weekends and major holidays. You have access to a nurse at all times for urgent questions. Please call the main number to the clinic Dept: [phone] and follow the prompts. ? ? ?For any non-urgent questions, you may also contact your provider using MyChart. We now offer e-Visits for anyone 59 and older to request care online for non-urgent symptoms. For details visit [email or web address]. ?  ?Also download the MyChart app! Go to the app store, search "MyChart", open the app, select [facility], and log in with your MyChart username and password. ? ?Due to Covid, a mask is required upon entering the hospital/clinic. If you do not have a mask, one will be given to you upon arrival. For doctor visits, patients may have 1 support person aged 52 or older with them. For treatment visits, patients cannot have anyone with them due to current Covid guidelines and our immunocompromised population.  ? ?

## 2021-08-29 ENCOUNTER — Telehealth: Payer: Self-pay | Admitting: Hematology

## 2021-08-29 LAB — VITAMIN D 25 HYDROXY (VIT D DEFICIENCY, FRACTURES)

## 2021-08-29 NOTE — Telephone Encounter (Signed)
Left message with follow-up appointments per 4/24 los. ?

## 2021-08-30 ENCOUNTER — Encounter: Payer: Self-pay | Admitting: Hematology

## 2021-08-30 NOTE — Progress Notes (Signed)
. ? ? ?HEMATOLOGY/ONCOLOGY CLINIC NOTE ? ?Date of Service: .08/27/2021 ? ? ?Patient Care Team: ?Ginger Organ., MD as PCP - General (Internal Medicine) ?Minus Breeding, MD as PCP - Cardiology (Cardiology) ? ?CHIEF COMPLAINTS/PURPOSE OF CONSULTATION:  ? 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 ? ?HISTORY OF PRESENTING ILLNESS:  ? ?Please see previous note for details on initial presentation ? ?INTERVAL HISTORY ? ?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 ?She notes some increased fatigue and some mild dyspnea on exertion in the context of developing anemia. ?New leg swelling.  No new cough. No palpitations. ?She notes her jitteriness has improved since dexamethasone dose was reduced. ?No symptoms suggestive of neuropathy at this time.  No nausea vomiting or diarrhea. ?Labs done today reviewed in detail. ? ?MEDICAL HISTORY:  ?Past Medical History:  ?Diagnosis Date  ? ASHD (arteriosclerotic heart disease)   ? Mild septal hypertrophy, 2D ECHO 11/2006, Dr. Percival Spanish  ? Breast cancer ([facility])   ? PMH of, Dr. Marylene Buerger, Dr. Truddie Coco  multilpe sites overlaping, ER +  ? Facet syndrome, lumbar   ? Dr. Lynann Bologna  ? Heart murmur   ? no problems  ? History of COVID-19 05/07/2019  ? Hyperlipidemia   ? Hyperlipidemia   ? Hypertension   ? Osteopenia   ? Skin cancer, basal cell   ? Dr. Amy Martinique  ? ? ? ?SURGICAL HISTORY: ?Past Surgical History:  ?Procedure Laterality Date  ? CATARACT EXTRACTION Right 06/2020  ? CHOLECYSTECTOMY  about 1993  ? COLONOSCOPY    ?  X 3 negative (last 2008, due 2015), Dr. Olevia Perches  ? MASTECTOMY Left 11/2004  ? Dr. Marylene Buerger, also reduction of right breast and reconstruction on the left.  ? ROTATOR CUFF REPAIR Left   ? TONSILLECTOMY    ? TRANSESOPHAGEAL ECHOCARDIOGRAM  2012  ? Overall left ventricular systolic function was normal. Left ventricular ejection  fraction was estimated, range being 55% to 60%. There were no left ventricular regional wall motional abnormalities. Left ventricular wall thickness was mildly increased. There was mild focal basal septal hypertrophy.  ? WISDOM TOOTH EXTRACTION    ? ? ?SOCIAL HISTORY: ?Social History  ? ?Socioeconomic History  ? Marital status: Married  ?  Spouse name: Not on file  ? Number of children: Not on file  ? Years of education: Not on file  ? Highest education level: Not on file  ?Occupational History  ? Occupation: Retired  ?  Employer: UNEMPLOYED  ?Tobacco Use  ? Smoking status: Former  ?  Packs/day: 1.00  ?  Years: 10.00  ?  Pack years: 10.00  ?  Types: Cigarettes  ?  Quit date: 05/06/1978  ?  Years since quitting: 43.3  ? Smokeless tobacco: Never  ? Tobacco comments:  ?  smoked [place], up to 1 ppd  ?Vaping Use  ? Vaping Use: Never used  ?Substance and Sexual Activity  ? Alcohol use: Yes  ?  Alcohol/week: 4.0 standard drinks  ?  Types: 4 Glasses of wine per week  ? Drug use: No  ? Sexual activity: Yes  ?  Partners: Male  ?  Birth control/protection: Post-menopausal  ?Other Topics Concern  ? Not on file  ?Social History Narrative  ? Married  ? Regular exercise: Yes, high level CVE, walks 2 mpd 5x/ week & gym  ? No specific diet  ? ?  Social Determinants of Health  ? ?Financial Resource Strain: Not on file  ?Food Insecurity: Not on file  ?Transportation Needs: Not on file  ?Physical Activity: Not on file  ?Stress: Not on file  ?Social Connections: Not on file  ?Intimate Partner Violence: Not on file  ? ? ?FAMILY HISTORY: ?Family History  ?Problem Relation Age of Onset  ? Hypertension Mother   ? Coronary artery disease Mother   ?     no MI  ? Lung cancer Mother   ?     smoker  ? Colon cancer Father 21  ? Coronary artery disease Father   ?     no MI  ? Prostate cancer Father   ? Diabetes Father   ? Hypertension Sister   ? Cancer Maternal Grandfather   ?     GI  ? Diabetes Paternal Grandfather   ? Stroke Neg Hx   ?  Esophageal cancer Neg Hx   ? Stomach cancer Neg Hx   ? Rectal cancer Neg Hx   ? ? ?ALLERGIES:  is allergic to shrimp [shellfish allergy], hydrocodone, and vicodin [hydrocodone-acetaminophen]. ? ?MEDICATIONS:  ?Current Outpatient Medications  ?Medication Sig Dispense Refill  ? acyclovir (ZOVIRAX) 400 MG tablet Take 1 tablet (400 mg total) by mouth 2 (two) times daily. 30 tablet 6  ? amLODipine (NORVASC) 10 MG tablet Take 1 tablet (10 mg total) by mouth daily. 30 tablet 0  ? benazepril (LOTENSIN) 40 MG tablet Take 1 tablet (40 mg total) by mouth daily. 30 tablet 0  ? cyclophosphamide (CYTOXAN) 50 MG capsule Take 10 capsules (500 mg total) by mouth once a week. Take with food to minimize GI upset. Take early in the day and maintain hydration. (Bring to cancer center and take with Velcade injection after nausea medications). 40 capsule 2  ? denosumab (PROLIA) 60 MG/ML SOLN injection Inject 60 mg into the skin every 6 (six) months. Administer in upper arm, thigh, or abdomen    ? iron polysaccharides (NIFEREX) 150 MG capsule Take 1 capsule (150 mg total) by mouth daily. 30 capsule 2  ? LORazepam (ATIVAN) 0.5 MG tablet Take 1 tablet (0.5 mg total) by mouth every 6 (six) hours as needed (Nausea or vomiting). 30 tablet 0  ? ondansetron (ZOFRAN) 8 MG tablet Take 8 mg by mouth 30 to 60 min prior to Cytoxan administration then take 8 mg twice daily as needed for nausea and vomiting. 30 tablet 1  ? prochlorperazine (COMPAZINE) 10 MG tablet Take 1 tablet (10 mg total) by mouth every 6 (six) hours as needed (Nausea or vomiting). 30 tablet 1  ? rosuvastatin (CRESTOR) 20 MG tablet 1 tablet    ? ?No current facility-administered medications for this visit.  ? ? ?REVIEW OF SYSTEMS:   ?10 Point review of Systems was done is negative except as noted above. ? ?PHYSICAL EXAMINATION :  ?.BP 127/75 (BP Location: Right Arm, Patient Position: Sitting)   Pulse 66   Temp 98.3 ?F (36.8 ?C) (Oral)   Resp 18   Wt 120 lb 12.8 oz (54.8 kg)    LMP 05/07/1999 (LMP Unknown)   SpO2 99%   BMI 21.40 kg/m?  ?NAD ?GENERAL:alert, in no acute distress and comfortable ?SKIN: no acute rashes, no significant lesions ?EYES: conjunctiva are pink and non-injected, sclera anicteric ?OROPHARYNX: MMM, no exudates, no oropharyngeal erythema or ulceration ?NECK: supple, no JVD ?LYMPH:  no palpable lymphadenopathy in the cervical, axillary or inguinal regions ?LUNGS: clear to auscultation b/l with normal  respiratory effort ?HEART: regular rate & rhythm ?ABDOMEN:  normoactive bowel sounds , non tender, not distended. ?Extremity: no pedal edema ?PSYCH: alert & oriented x 3 with fluent speech ?NEURO: no focal motor/sensory deficits ? ? ?LABORATORY DATA:  ?I have reviewed the data as listed ? ?. ? ?  Latest Ref Rng & Units 08/27/2021  ?  8:37 AM 08/20/2021  ? 11:56 AM 08/13/2021  ? 11:31 AM  ?CBC  ?WBC 4.0 - 10.5 K/uL 3.2   2.2   4.0    ?Hemoglobin 12.0 - 15.0 g/dL 8.9   9.3   9.5    ?Hematocrit 36.0 - 46.0 % 26.4   27.4   27.3    ?Platelets 150 - 400 K/uL 187   182   201    ?[facility] KitchenCBC ?   ?Component Value Date/Time  ? WBC 3.2 (L) 08/27/2021 0837  ? RBC 2.85 (L) 08/27/2021 0837  ? HGB 8.9 (L) 08/27/2021 0837  ? HGB 9.3 (L) 08/20/2021 1156  ? HCT 26.4 (L) 08/27/2021 0837  ? PLT 187 08/27/2021 0837  ? PLT 182 08/20/2021 1156  ? MCV 92.6 08/27/2021 0837  ? MCH 31.2 08/27/2021 0837  ? MCHC 33.7 08/27/2021 0837  ? RDW 14.7 08/27/2021 0837  ? LYMPHSABS 0.5 (L) 08/27/2021 7482  ? MONOABS 0.3 08/27/2021 0837  ? EOSABS 0.1 08/27/2021 0837  ? BASOSABS 0.0 08/27/2021 7078  ? ? ? ?  Latest Ref Rng & Units 08/27/2021  ?  8:37 AM 08/20/2021  ? 11:56 AM 08/13/2021  ? 11:31 AM  ?CMP  ?Glucose 70 - 99 mg/dL 103   112   100    ?BUN 8 - 23 mg/dL 31   36   33    ?Creatinine 0.44 - 1.00 mg/dL 1.19   1.20   1.22    ?Sodium 135 - 145 mmol/L 137   137   139    ?Potassium 3.5 - 5.1 mmol/L 3.7   3.3   3.5    ?Chloride 98 - 111 mmol/L 106   102   103    ?CO2 22 - 32 mmol/L '25   28   30    '$ ?Calcium 8.9 - 10.3 mg/dL  9.0   9.3   9.4    ?Total Protein 6.5 - 8.1 g/dL 6.5   6.7   6.5    ?Total Bilirubin 0.3 - 1.2 mg/dL 0.5   0.4   0.4    ?Alkaline Phos 38 - 126 U/L 35   39   36    ?AST 15 - 41 U/L 31   37   29    ?ALT 0 - 44 U/L 32   3

## 2021-09-03 ENCOUNTER — Inpatient Hospital Stay: Payer: Medicare Other | Attending: Hematology

## 2021-09-03 ENCOUNTER — Other Ambulatory Visit: Payer: Self-pay

## 2021-09-03 ENCOUNTER — Inpatient Hospital Stay: Payer: Medicare Other

## 2021-09-03 ENCOUNTER — Other Ambulatory Visit: Payer: Medicare Other

## 2021-09-03 ENCOUNTER — Ambulatory Visit: Payer: Medicare Other

## 2021-09-03 VITALS — BP 116/79 | HR 65 | Temp 97.9°F | Resp 20 | Wt 122.0 lb

## 2021-09-03 DIAGNOSIS — D8989 Other specified disorders involving the immune mechanism, not elsewhere classified: Secondary | ICD-10-CM

## 2021-09-03 DIAGNOSIS — Z5112 Encounter for antineoplastic immunotherapy: Secondary | ICD-10-CM | POA: Diagnosis not present

## 2021-09-03 DIAGNOSIS — N189 Chronic kidney disease, unspecified: Secondary | ICD-10-CM | POA: Insufficient documentation

## 2021-09-03 DIAGNOSIS — Z8616 Personal history of COVID-19: Secondary | ICD-10-CM | POA: Diagnosis not present

## 2021-09-03 DIAGNOSIS — R803 Bence Jones proteinuria: Secondary | ICD-10-CM | POA: Insufficient documentation

## 2021-09-03 DIAGNOSIS — Z7189 Other specified counseling: Secondary | ICD-10-CM

## 2021-09-03 DIAGNOSIS — Z800 Family history of malignant neoplasm of digestive organs: Secondary | ICD-10-CM | POA: Diagnosis not present

## 2021-09-03 DIAGNOSIS — Z87891 Personal history of nicotine dependence: Secondary | ICD-10-CM | POA: Diagnosis not present

## 2021-09-03 DIAGNOSIS — Z8042 Family history of malignant neoplasm of prostate: Secondary | ICD-10-CM | POA: Insufficient documentation

## 2021-09-03 DIAGNOSIS — C9000 Multiple myeloma not having achieved remission: Secondary | ICD-10-CM | POA: Insufficient documentation

## 2021-09-03 DIAGNOSIS — Z8249 Family history of ischemic heart disease and other diseases of the circulatory system: Secondary | ICD-10-CM | POA: Diagnosis not present

## 2021-09-03 DIAGNOSIS — Z7289 Other problems related to lifestyle: Secondary | ICD-10-CM | POA: Insufficient documentation

## 2021-09-03 DIAGNOSIS — Z833 Family history of diabetes mellitus: Secondary | ICD-10-CM | POA: Insufficient documentation

## 2021-09-03 DIAGNOSIS — Z801 Family history of malignant neoplasm of trachea, bronchus and lung: Secondary | ICD-10-CM | POA: Insufficient documentation

## 2021-09-03 DIAGNOSIS — I10 Essential (primary) hypertension: Secondary | ICD-10-CM | POA: Diagnosis not present

## 2021-09-03 DIAGNOSIS — D649 Anemia, unspecified: Secondary | ICD-10-CM | POA: Insufficient documentation

## 2021-09-03 DIAGNOSIS — Z79899 Other long term (current) drug therapy: Secondary | ICD-10-CM | POA: Insufficient documentation

## 2021-09-03 LAB — CBC WITH DIFFERENTIAL/PLATELET
Abs Immature Granulocytes: 0.03 10*3/uL (ref 0.00–0.07)
Basophils Absolute: 0.1 10*3/uL (ref 0.0–0.1)
Basophils Relative: 1 %
Eosinophils Absolute: 0.1 10*3/uL (ref 0.0–0.5)
Eosinophils Relative: 3 %
HCT: 25.8 % — ABNORMAL LOW (ref 36.0–46.0)
Hemoglobin: 8.9 g/dL — ABNORMAL LOW (ref 12.0–15.0)
Immature Granulocytes: 1 %
Lymphocytes Relative: 17 %
Lymphs Abs: 0.6 10*3/uL — ABNORMAL LOW (ref 0.7–4.0)
MCH: 32.4 pg (ref 26.0–34.0)
MCHC: 34.5 g/dL (ref 30.0–36.0)
MCV: 93.8 fL (ref 80.0–100.0)
Monocytes Absolute: 0.5 10*3/uL (ref 0.1–1.0)
Monocytes Relative: 13 %
Neutro Abs: 2.3 10*3/uL (ref 1.7–7.7)
Neutrophils Relative %: 65 %
Platelets: 204 10*3/uL (ref 150–400)
RBC: 2.75 MIL/uL — ABNORMAL LOW (ref 3.87–5.11)
RDW: 15.7 % — ABNORMAL HIGH (ref 11.5–15.5)
WBC: 3.6 10*3/uL — ABNORMAL LOW (ref 4.0–10.5)
nRBC: 0 % (ref 0.0–0.2)

## 2021-09-03 LAB — CMP (CANCER CENTER ONLY)
ALT: 26 U/L (ref 0–44)
AST: 29 U/L (ref 15–41)
Albumin: 4.1 g/dL (ref 3.5–5.0)
Alkaline Phosphatase: 39 U/L (ref 38–126)
Anion gap: 3 — ABNORMAL LOW (ref 5–15)
BUN: 35 mg/dL — ABNORMAL HIGH (ref 8–23)
CO2: 29 mmol/L (ref 22–32)
Calcium: 9 mg/dL (ref 8.9–10.3)
Chloride: 106 mmol/L (ref 98–111)
Creatinine: 1.18 mg/dL — ABNORMAL HIGH (ref 0.44–1.00)
GFR, Estimated: 49 mL/min — ABNORMAL LOW (ref >60)
Glucose, Bld: 102 mg/dL — ABNORMAL HIGH (ref 70–99)
Potassium: 3.8 mmol/L (ref 3.5–5.1)
Sodium: 138 mmol/L (ref 135–145)
Total Bilirubin: 0.4 mg/dL (ref 0.3–1.2)
Total Protein: 6.3 g/dL — ABNORMAL LOW (ref 6.5–8.1)

## 2021-09-03 MED ORDER — DEXAMETHASONE 4 MG PO TABS
20.0000 mg | ORAL_TABLET | Freq: Once | ORAL | Status: AC
  Administered 2021-09-03: 20 mg via ORAL
  Filled 2021-09-03: qty 5

## 2021-09-03 MED ORDER — FAMOTIDINE 20 MG PO TABS
20.0000 mg | ORAL_TABLET | Freq: Once | ORAL | Status: AC
  Administered 2021-09-03: 20 mg via ORAL
  Filled 2021-09-03: qty 1

## 2021-09-03 MED ORDER — ONDANSETRON HCL 8 MG PO TABS
12.0000 mg | ORAL_TABLET | Freq: Once | ORAL | Status: AC
  Administered 2021-09-03: 12 mg via ORAL
  Filled 2021-09-03: qty 2

## 2021-09-03 MED ORDER — BORTEZOMIB CHEMO SQ INJECTION 3.5 MG (2.5MG/ML)
1.5000 mg/m2 | Freq: Once | INTRAMUSCULAR | Status: AC
  Administered 2021-09-03: 2.25 mg via SUBCUTANEOUS
  Filled 2021-09-03: qty 0.9

## 2021-09-03 MED ORDER — ACETAMINOPHEN 325 MG PO TABS
650.0000 mg | ORAL_TABLET | Freq: Once | ORAL | Status: AC
  Administered 2021-09-03: 650 mg via ORAL
  Filled 2021-09-03: qty 2

## 2021-09-03 NOTE — Patient Instructions (Signed)
[facility]  Discharge Instructions: ?Thank you for choosing [facility] to provide your oncology and hematology care.  ? ?If you have a lab appointment with the [facility], please go directly to the [facility] and check in at the registration area. ?  ?Wear comfortable clothing and clothing appropriate for easy access to any Portacath or PICC line.  ? ?We strive to give you quality time with your provider. You may need to reschedule your appointment if you arrive late (15 or more minutes).  Arriving late affects you and other patients whose appointments are after yours.  Also, if you miss three or more appointments without notifying the office, you may be dismissed from the clinic at the provider?s discretion.    ?  ?For prescription refill requests, have your pharmacy contact our office and allow 72 hours for refills to be completed.   ? ?Today you received the following chemotherapy and/or immunotherapy agents velcade    ?  ?To help prevent nausea and vomiting after your treatment, we encourage you to take your nausea medication as directed. ? ?BELOW ARE SYMPTOMS THAT SHOULD BE REPORTED IMMEDIATELY: ?*FEVER GREATER THAN 100.4 F (38 ?C) OR HIGHER ?*CHILLS OR SWEATING ?*NAUSEA AND VOMITING THAT IS NOT CONTROLLED WITH YOUR NAUSEA MEDICATION ?*UNUSUAL SHORTNESS OF BREATH ?*UNUSUAL BRUISING OR BLEEDING ?*URINARY PROBLEMS (pain or burning when urinating, or frequent urination) ?*BOWEL PROBLEMS (unusual diarrhea, constipation, pain near the anus) ?TENDERNESS IN MOUTH AND THROAT WITH OR WITHOUT PRESENCE OF ULCERS (sore throat, sores in mouth, or a toothache) ?UNUSUAL RASH, SWELLING OR PAIN  ?UNUSUAL VAGINAL DISCHARGE OR ITCHING  ? ?Items with * indicate a potential emergency and should be followed up as soon as possible or go to the Emergency Department if any problems should occur. ? ?Please show the CHEMOTHERAPY ALERT CARD or IMMUNOTHERAPY ALERT CARD at check-in to the  Emergency Department and triage nurse. ? ?Should you have questions after your visit or need to cancel or reschedule your appointment, please contact [facility]  Dept: [phone]  and follow the prompts.  Office hours are 8:00 a.m. to 4:30 p.m. Monday - Friday. Please note that voicemails left after 4:00 p.m. may not be returned until the following business day.  We are closed weekends and major holidays. You have access to a nurse at all times for urgent questions. Please call the main number to the clinic Dept: [phone] and follow the prompts. ? ? ?For any non-urgent questions, you may also contact your provider using MyChart. We now offer e-Visits for anyone 75 and older to request care online for non-urgent symptoms. For details visit [email or web address]. ?  ?Also download the MyChart app! Go to the app store, search "MyChart", open the app, select [facility], and log in with your MyChart username and password. ? ?Due to Covid, a mask is required upon entering the hospital/clinic. If you do not have a mask, one will be given to you upon arrival. For doctor visits, patients may have 1 support person aged 57 or older with them. For treatment visits, patients cannot have anyone with them due to current Covid guidelines and our immunocompromised population.  ? ?

## 2021-09-03 NOTE — Progress Notes (Signed)
Patient prefers to take APAP '650mg'$  with Velcade. ? ?Acquanetta Belling, [place], BCPS, BCOP ?09/03/2021 ?1:21 PM ? ?

## 2021-09-03 NOTE — Progress Notes (Signed)
Cytoxan '500mg'$  (10 capsules) administered from home supply at 1255. Verified with Everlena Cooper., RN ?

## 2021-09-03 NOTE — Addendum Note (Signed)
Addended by: Neysa Hotter on: 09/03/2021 01:25 PM ? ? Modules accepted: Orders ? ?

## 2021-09-10 ENCOUNTER — Ambulatory Visit: Payer: Medicare Other

## 2021-09-10 ENCOUNTER — Inpatient Hospital Stay: Payer: Medicare Other

## 2021-09-10 ENCOUNTER — Other Ambulatory Visit: Payer: Self-pay

## 2021-09-10 ENCOUNTER — Other Ambulatory Visit: Payer: Medicare Other

## 2021-09-10 VITALS — BP 117/68 | HR 71 | Temp 98.4°F | Resp 20 | Wt 120.4 lb

## 2021-09-10 DIAGNOSIS — D649 Anemia, unspecified: Secondary | ICD-10-CM | POA: Diagnosis not present

## 2021-09-10 DIAGNOSIS — D8989 Other specified disorders involving the immune mechanism, not elsewhere classified: Secondary | ICD-10-CM

## 2021-09-10 DIAGNOSIS — I10 Essential (primary) hypertension: Secondary | ICD-10-CM | POA: Diagnosis not present

## 2021-09-10 DIAGNOSIS — R803 Bence Jones proteinuria: Secondary | ICD-10-CM | POA: Diagnosis not present

## 2021-09-10 DIAGNOSIS — C9000 Multiple myeloma not having achieved remission: Secondary | ICD-10-CM

## 2021-09-10 DIAGNOSIS — Z5112 Encounter for antineoplastic immunotherapy: Secondary | ICD-10-CM | POA: Diagnosis not present

## 2021-09-10 DIAGNOSIS — Z7189 Other specified counseling: Secondary | ICD-10-CM

## 2021-09-10 DIAGNOSIS — N189 Chronic kidney disease, unspecified: Secondary | ICD-10-CM | POA: Diagnosis not present

## 2021-09-10 LAB — CBC WITH DIFFERENTIAL/PLATELET
Abs Immature Granulocytes: 0.04 10*3/uL (ref 0.00–0.07)
Basophils Absolute: 0 10*3/uL (ref 0.0–0.1)
Basophils Relative: 1 %
Eosinophils Absolute: 0.1 10*3/uL (ref 0.0–0.5)
Eosinophils Relative: 3 %
HCT: 26.1 % — ABNORMAL LOW (ref 36.0–46.0)
Hemoglobin: 8.8 g/dL — ABNORMAL LOW (ref 12.0–15.0)
Immature Granulocytes: 1 %
Lymphocytes Relative: 12 %
Lymphs Abs: 0.5 10*3/uL — ABNORMAL LOW (ref 0.7–4.0)
MCH: 32 pg (ref 26.0–34.0)
MCHC: 33.7 g/dL (ref 30.0–36.0)
MCV: 94.9 fL (ref 80.0–100.0)
Monocytes Absolute: 0.4 10*3/uL (ref 0.1–1.0)
Monocytes Relative: 10 %
Neutro Abs: 2.9 10*3/uL (ref 1.7–7.7)
Neutrophils Relative %: 73 %
Platelets: 197 10*3/uL (ref 150–400)
RBC: 2.75 MIL/uL — ABNORMAL LOW (ref 3.87–5.11)
RDW: 16 % — ABNORMAL HIGH (ref 11.5–15.5)
WBC: 3.9 10*3/uL — ABNORMAL LOW (ref 4.0–10.5)
nRBC: 0 % (ref 0.0–0.2)

## 2021-09-10 LAB — CMP (CANCER CENTER ONLY)
ALT: 23 U/L (ref 0–44)
AST: 26 U/L (ref 15–41)
Albumin: 4 g/dL (ref 3.5–5.0)
Alkaline Phosphatase: 36 U/L — ABNORMAL LOW (ref 38–126)
Anion gap: 6 (ref 5–15)
BUN: 35 mg/dL — ABNORMAL HIGH (ref 8–23)
CO2: 28 mmol/L (ref 22–32)
Calcium: 8.9 mg/dL (ref 8.9–10.3)
Chloride: 105 mmol/L (ref 98–111)
Creatinine: 1.36 mg/dL — ABNORMAL HIGH (ref 0.44–1.00)
GFR, Estimated: 41 mL/min — ABNORMAL LOW (ref >60)
Glucose, Bld: 102 mg/dL — ABNORMAL HIGH (ref 70–99)
Potassium: 3.5 mmol/L (ref 3.5–5.1)
Sodium: 139 mmol/L (ref 135–145)
Total Bilirubin: 0.5 mg/dL (ref 0.3–1.2)
Total Protein: 6.4 g/dL — ABNORMAL LOW (ref 6.5–8.1)

## 2021-09-10 MED ORDER — DEXAMETHASONE 4 MG PO TABS
20.0000 mg | ORAL_TABLET | Freq: Once | ORAL | Status: AC
  Administered 2021-09-10: 20 mg via ORAL
  Filled 2021-09-10: qty 5

## 2021-09-10 MED ORDER — ONDANSETRON HCL 8 MG PO TABS
12.0000 mg | ORAL_TABLET | Freq: Once | ORAL | Status: AC
  Administered 2021-09-10: 12 mg via ORAL
  Filled 2021-09-10: qty 2

## 2021-09-10 MED ORDER — ACETAMINOPHEN 325 MG PO TABS
650.0000 mg | ORAL_TABLET | Freq: Once | ORAL | Status: AC
  Administered 2021-09-10: 650 mg via ORAL
  Filled 2021-09-10: qty 2

## 2021-09-10 MED ORDER — BORTEZOMIB CHEMO SQ INJECTION 3.5 MG (2.5MG/ML)
1.5000 mg/m2 | Freq: Once | INTRAMUSCULAR | Status: AC
  Administered 2021-09-10: 2.25 mg via SUBCUTANEOUS
  Filled 2021-09-10: qty 0.9

## 2021-09-10 MED ORDER — FAMOTIDINE 20 MG PO TABS
20.0000 mg | ORAL_TABLET | Freq: Once | ORAL | Status: AC
  Administered 2021-09-10: 20 mg via ORAL
  Filled 2021-09-10: qty 1

## 2021-09-10 NOTE — Patient Instructions (Signed)
[facility]  Discharge Instructions: Thank you for choosing [facility] to provide your oncology and hematology care.   If you have a lab appointment with the Cancer Center, please go directly to the Cancer Center and check in at the registration area.   Wear comfortable clothing and clothing appropriate for easy access to any Portacath or PICC line.   We strive to give you quality time with your provider. You may need to reschedule your appointment if you arrive late (15 or more minutes).  Arriving late affects you and other patients whose appointments are after yours.  Also, if you miss three or more appointments without notifying the office, you may be dismissed from the clinic at the provider's discretion.      For prescription refill requests, have your pharmacy contact our office and allow 72 hours for refills to be completed.    Today you received the following chemotherapy and/or immunotherapy agent: Bortezomib (Velcade).   To help prevent nausea and vomiting after your treatment, we encourage you to take your nausea medication as directed.  BELOW ARE SYMPTOMS THAT SHOULD BE REPORTED IMMEDIATELY: *FEVER GREATER THAN 100.4 F (38 C) OR HIGHER *CHILLS OR SWEATING *NAUSEA AND VOMITING THAT IS NOT CONTROLLED WITH YOUR NAUSEA MEDICATION *UNUSUAL SHORTNESS OF BREATH *UNUSUAL BRUISING OR BLEEDING *URINARY PROBLEMS (pain or burning when urinating, or frequent urination) *BOWEL PROBLEMS (unusual diarrhea, constipation, pain near the anus) TENDERNESS IN MOUTH AND THROAT WITH OR WITHOUT PRESENCE OF ULCERS (sore throat, sores in mouth, or a toothache) UNUSUAL RASH, SWELLING OR PAIN  UNUSUAL VAGINAL DISCHARGE OR ITCHING   Items with * indicate a potential emergency and should be followed up as soon as possible or go to the Emergency Department if any problems should occur.  Please show the CHEMOTHERAPY ALERT CARD or IMMUNOTHERAPY ALERT CARD at  check-in to the Emergency Department and triage nurse.  Should you have questions after your visit or need to cancel or reschedule your appointment, please contact [facility]  Dept: [phone]  and follow the prompts.  Office hours are 8:00 a.m. to 4:30 p.m. Monday - Friday. Please note that voicemails left after 4:00 p.m. may not be returned until the following business day.  We are closed weekends and major holidays. You have access to a nurse at all times for urgent questions. Please call the main number to the clinic Dept: [phone] and follow the prompts.   For any non-urgent questions, you may also contact your provider using MyChart. We now offer e-Visits for anyone 18 and older to request care online for non-urgent symptoms. For details visit [email or web address].   Also download the MyChart app! Go to the app store, search "MyChart", open the app, select [facility], and log in with your MyChart username and password.  Due to Covid, a mask is required upon entering the hospital/clinic. If you do not have a mask, one will be given to you upon arrival. For doctor visits, patients may have 1 support person aged 18 or older with them. For treatment visits, patients cannot have anyone with them due to current Covid guidelines and our immunocompromised population.   

## 2021-09-10 NOTE — Progress Notes (Signed)
Pt given Cytoxan 500 mg (10 capsules/'50mg'$ ) PO from home bottle.  Verified by Jerene Pitch, RN. ?

## 2021-09-11 LAB — KAPPA/LAMBDA LIGHT CHAINS
Kappa free light chain: 13.5 mg/L (ref 3.3–19.4)
Kappa, lambda light chain ratio: 0.05 — ABNORMAL LOW (ref 0.26–1.65)
Lambda free light chains: 292.4 mg/L — ABNORMAL HIGH (ref 5.7–26.3)

## 2021-09-12 ENCOUNTER — Other Ambulatory Visit (HOSPITAL_COMMUNITY): Payer: Self-pay

## 2021-09-12 ENCOUNTER — Other Ambulatory Visit: Payer: Self-pay | Admitting: Hematology

## 2021-09-12 DIAGNOSIS — D8989 Other specified disorders involving the immune mechanism, not elsewhere classified: Secondary | ICD-10-CM

## 2021-09-12 DIAGNOSIS — N183 Chronic kidney disease, stage 3 unspecified: Secondary | ICD-10-CM | POA: Diagnosis not present

## 2021-09-12 DIAGNOSIS — D631 Anemia in chronic kidney disease: Secondary | ICD-10-CM | POA: Diagnosis not present

## 2021-09-12 DIAGNOSIS — N1831 Chronic kidney disease, stage 3a: Secondary | ICD-10-CM | POA: Diagnosis not present

## 2021-09-12 DIAGNOSIS — E8581 Light chain (AL) amyloidosis: Secondary | ICD-10-CM | POA: Diagnosis not present

## 2021-09-12 DIAGNOSIS — I422 Other hypertrophic cardiomyopathy: Secondary | ICD-10-CM | POA: Diagnosis not present

## 2021-09-12 DIAGNOSIS — Z885 Allergy status to narcotic agent status: Secondary | ICD-10-CM | POA: Diagnosis not present

## 2021-09-12 DIAGNOSIS — G4700 Insomnia, unspecified: Secondary | ICD-10-CM | POA: Diagnosis not present

## 2021-09-12 DIAGNOSIS — Z6820 Body mass index (BMI) 20.0-20.9, adult: Secondary | ICD-10-CM | POA: Diagnosis not present

## 2021-09-12 DIAGNOSIS — R197 Diarrhea, unspecified: Secondary | ICD-10-CM | POA: Diagnosis not present

## 2021-09-12 DIAGNOSIS — T451X5A Adverse effect of antineoplastic and immunosuppressive drugs, initial encounter: Secondary | ICD-10-CM | POA: Diagnosis not present

## 2021-09-12 MED ORDER — CYCLOPHOSPHAMIDE 50 MG PO CAPS
300.0000 mg/m2 | ORAL_CAPSULE | ORAL | 2 refills | Status: DC
  Filled 2021-09-19: qty 40, 28d supply, fill #0
  Filled 2021-10-08: qty 40, 28d supply, fill #1
  Filled 2021-11-09: qty 40, 28d supply, fill #2

## 2021-09-13 DIAGNOSIS — Z23 Encounter for immunization: Secondary | ICD-10-CM | POA: Diagnosis not present

## 2021-09-14 ENCOUNTER — Telehealth: Payer: Self-pay

## 2021-09-14 NOTE — Telephone Encounter (Signed)
Pt called and per pt request labs faxed over to [facility] rheumatology.  ?

## 2021-09-17 ENCOUNTER — Other Ambulatory Visit: Payer: Self-pay

## 2021-09-17 ENCOUNTER — Inpatient Hospital Stay (HOSPITAL_BASED_OUTPATIENT_CLINIC_OR_DEPARTMENT_OTHER): Payer: Medicare Other | Admitting: Hematology

## 2021-09-17 ENCOUNTER — Inpatient Hospital Stay: Payer: Medicare Other

## 2021-09-17 ENCOUNTER — Other Ambulatory Visit: Payer: Medicare Other

## 2021-09-17 ENCOUNTER — Ambulatory Visit: Payer: Medicare Other

## 2021-09-17 VITALS — BP 123/72 | HR 71 | Temp 97.7°F | Resp 20 | Wt 120.0 lb

## 2021-09-17 DIAGNOSIS — Z5112 Encounter for antineoplastic immunotherapy: Secondary | ICD-10-CM | POA: Diagnosis not present

## 2021-09-17 DIAGNOSIS — C9000 Multiple myeloma not having achieved remission: Secondary | ICD-10-CM

## 2021-09-17 DIAGNOSIS — Z5111 Encounter for antineoplastic chemotherapy: Secondary | ICD-10-CM

## 2021-09-17 DIAGNOSIS — D8989 Other specified disorders involving the immune mechanism, not elsewhere classified: Secondary | ICD-10-CM

## 2021-09-17 DIAGNOSIS — D649 Anemia, unspecified: Secondary | ICD-10-CM | POA: Diagnosis not present

## 2021-09-17 DIAGNOSIS — I10 Essential (primary) hypertension: Secondary | ICD-10-CM | POA: Diagnosis not present

## 2021-09-17 DIAGNOSIS — R803 Bence Jones proteinuria: Secondary | ICD-10-CM | POA: Diagnosis not present

## 2021-09-17 DIAGNOSIS — Z7189 Other specified counseling: Secondary | ICD-10-CM

## 2021-09-17 DIAGNOSIS — N189 Chronic kidney disease, unspecified: Secondary | ICD-10-CM | POA: Diagnosis not present

## 2021-09-17 LAB — CBC WITH DIFFERENTIAL/PLATELET
Abs Immature Granulocytes: 0.03 10*3/uL (ref 0.00–0.07)
Basophils Absolute: 0.1 10*3/uL (ref 0.0–0.1)
Basophils Relative: 2 %
Eosinophils Absolute: 0.1 10*3/uL (ref 0.0–0.5)
Eosinophils Relative: 4 %
HCT: 25.2 % — ABNORMAL LOW (ref 36.0–46.0)
Hemoglobin: 8.7 g/dL — ABNORMAL LOW (ref 12.0–15.0)
Immature Granulocytes: 1 %
Lymphocytes Relative: 12 %
Lymphs Abs: 0.4 10*3/uL — ABNORMAL LOW (ref 0.7–4.0)
MCH: 32.5 pg (ref 26.0–34.0)
MCHC: 34.5 g/dL (ref 30.0–36.0)
MCV: 94 fL (ref 80.0–100.0)
Monocytes Absolute: 0.3 10*3/uL (ref 0.1–1.0)
Monocytes Relative: 9 %
Neutro Abs: 2.5 10*3/uL (ref 1.7–7.7)
Neutrophils Relative %: 72 %
Platelets: 229 10*3/uL (ref 150–400)
RBC: 2.68 MIL/uL — ABNORMAL LOW (ref 3.87–5.11)
RDW: 16.2 % — ABNORMAL HIGH (ref 11.5–15.5)
WBC: 3.4 10*3/uL — ABNORMAL LOW (ref 4.0–10.5)
nRBC: 0 % (ref 0.0–0.2)

## 2021-09-17 LAB — CMP (CANCER CENTER ONLY)
ALT: 22 U/L (ref 0–44)
AST: 26 U/L (ref 15–41)
Albumin: 4 g/dL (ref 3.5–5.0)
Alkaline Phosphatase: 35 U/L — ABNORMAL LOW (ref 38–126)
Anion gap: 5 (ref 5–15)
BUN: 26 mg/dL — ABNORMAL HIGH (ref 8–23)
CO2: 28 mmol/L (ref 22–32)
Calcium: 8.9 mg/dL (ref 8.9–10.3)
Chloride: 103 mmol/L (ref 98–111)
Creatinine: 1.2 mg/dL — ABNORMAL HIGH (ref 0.44–1.00)
GFR, Estimated: 48 mL/min — ABNORMAL LOW (ref >60)
Glucose, Bld: 153 mg/dL — ABNORMAL HIGH (ref 70–99)
Potassium: 3.4 mmol/L — ABNORMAL LOW (ref 3.5–5.1)
Sodium: 136 mmol/L (ref 135–145)
Total Bilirubin: 0.4 mg/dL (ref 0.3–1.2)
Total Protein: 6.1 g/dL — ABNORMAL LOW (ref 6.5–8.1)

## 2021-09-17 LAB — MULTIPLE MYELOMA PANEL, SERUM
Albumin SerPl Elph-Mcnc: 3.8 g/dL (ref 2.9–4.4)
Albumin/Glob SerPl: 2.1 — ABNORMAL HIGH (ref 0.7–1.7)
Alpha 1: 0.2 g/dL (ref 0.0–0.4)
Alpha2 Glob SerPl Elph-Mcnc: 0.6 g/dL (ref 0.4–1.0)
B-Globulin SerPl Elph-Mcnc: 0.8 g/dL (ref 0.7–1.3)
Gamma Glob SerPl Elph-Mcnc: 0.3 g/dL — ABNORMAL LOW (ref 0.4–1.8)
Globulin, Total: 1.9 g/dL — ABNORMAL LOW (ref 2.2–3.9)
IgA: 37 mg/dL — ABNORMAL LOW (ref 64–422)
IgG (Immunoglobin G), Serum: 419 mg/dL — ABNORMAL LOW (ref 586–1602)
IgM (Immunoglobulin M), Srm: 44 mg/dL (ref 26–217)
Total Protein ELP: 5.7 g/dL — ABNORMAL LOW (ref 6.0–8.5)

## 2021-09-17 MED ORDER — BORTEZOMIB CHEMO SQ INJECTION 3.5 MG (2.5MG/ML)
1.5000 mg/m2 | Freq: Once | INTRAMUSCULAR | Status: AC
  Administered 2021-09-17: 2.25 mg via SUBCUTANEOUS
  Filled 2021-09-17: qty 0.9

## 2021-09-17 MED ORDER — DEXAMETHASONE 4 MG PO TABS
20.0000 mg | ORAL_TABLET | Freq: Once | ORAL | Status: AC
  Administered 2021-09-17: 20 mg via ORAL
  Filled 2021-09-17: qty 5

## 2021-09-17 MED ORDER — ONDANSETRON HCL 8 MG PO TABS
12.0000 mg | ORAL_TABLET | Freq: Once | ORAL | Status: AC
  Administered 2021-09-17: 12 mg via ORAL
  Filled 2021-09-17: qty 2

## 2021-09-17 MED ORDER — FAMOTIDINE 20 MG PO TABS
20.0000 mg | ORAL_TABLET | Freq: Once | ORAL | Status: AC
  Administered 2021-09-17: 20 mg via ORAL
  Filled 2021-09-17: qty 1

## 2021-09-17 MED ORDER — ACETAMINOPHEN 325 MG PO TABS
650.0000 mg | ORAL_TABLET | Freq: Once | ORAL | Status: AC
  Administered 2021-09-17: 650 mg via ORAL
  Filled 2021-09-17: qty 2

## 2021-09-17 NOTE — Progress Notes (Signed)
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  HISTORY OF PRESENTING ILLNESS:  Please see previous note for details on initial presentation  INTERVAL HISTORY [name] is a 71 y.o. 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 She reports She is doing well with no new symptoms or concerns.  She reports recent diarrhea that she describes as crippling where she noted she was in a fetal position and she specifies roughly 25 visits to the bathroom during the episode. She notes that for 4 days following she had abdominal pain. No fever associated with it. We discussed taking an Imodium and replacing electrolytes with Gatorade or Pedialyte for future spells. We also discussed taking an over-the-counter probiotic or eating probiotic yogurt.  We discussed starting Daratumumab today which she is agreeable to.  No new medications.  No cough. No nausea or vomiting. No palpitations. No symptoms suggestive of neuropathy at this time. No bloody or black stools. No skin rashes. No new leg pain or swelling. No chest pain or shortness of breath. No other new or acute focal symptoms.  Labs done today reviewed in detail. CBC show Lymphocytopenia with WBC count of 3.4k and ALC of 0.4k, platelets are stable at 229k and hemoglobin was 8.7. Persistent normocytic anemia with RBC count of 2.68.  CMP stable. Creatinine elevated at 1.20 and GFR est. Low at 48 which is consistent with chronic kidney disease.  MEDICAL HISTORY:  Past Medical History:  Diagnosis Date   ASHD (arteriosclerotic heart disease)    Mild septal  hypertrophy, 2D ECHO 11/2006, Dr. Percival Spanish   Breast cancer [facility])    PMH of, Dr. Marylene Buerger, Dr. Truddie Coco  multilpe sites overlaping, ER +   Facet syndrome, lumbar    Dr. Lynann Bologna   Heart murmur    no problems   History of COVID-19 05/07/2019   Hyperlipidemia    Hyperlipidemia    Hypertension    Osteopenia    Skin cancer, basal cell    Dr. Amy Martinique     SURGICAL HISTORY: Past Surgical History:  Procedure Laterality Date   CATARACT EXTRACTION Right 06/2020   CHOLECYSTECTOMY  about 1993   COLONOSCOPY      X 3 negative (last 2008, due 2015), Dr. Olevia Perches   MASTECTOMY Left 11/2004   Dr. Marylene Buerger, also reduction of right breast and reconstruction on the left.   ROTATOR CUFF REPAIR Left    TONSILLECTOMY     TRANSESOPHAGEAL ECHOCARDIOGRAM  2012   Overall left ventricular systolic function was normal. Left ventricular ejection fraction was estimated, range being 55% to 60%. There were no left ventricular regional wall motional abnormalities. Left ventricular wall thickness was mildly increased. There was mild focal basal septal hypertrophy.   WISDOM TOOTH EXTRACTION      SOCIAL HISTORY: Social History   Socioeconomic History   Marital status: Married    Spouse name: Not on file   Number of children: Not on file   Years of education: Not on file   Highest education level: Not on file  Occupational History   Occupation: Retired    Fish farm manager: UNEMPLOYED  Tobacco Use   Smoking status: Former  Packs/day: 1.00    Years: 10.00    Pack years: 10.00    Types: Cigarettes    Quit date: 05/06/1978    Years since quitting: 43.3   Smokeless tobacco: Never   Tobacco comments:    smoked [place], up to 1 ppd  Vaping Use   Vaping Use: Never used  Substance and Sexual Activity   Alcohol use: Yes    Alcohol/week: 4.0 standard drinks    Types: 4 Glasses of wine per week   Drug use: No   Sexual activity: Yes    Partners: Male    Birth control/protection: Post-menopausal  Other  Topics Concern   Not on file  Social History Narrative   Married   Regular exercise: Yes, high level CVE, walks 2 mpd 5x/ week & gym   No specific diet   Social Determinants of Health   Financial Resource Strain: Not on file  Food Insecurity: Not on file  Transportation Needs: Not on file  Physical Activity: Not on file  Stress: Not on file  Social Connections: Not on file  Intimate Partner Violence: Not on file    FAMILY HISTORY: Family History  Problem Relation Age of Onset   Hypertension Mother    Coronary artery disease Mother        no MI   Lung cancer Mother        smoker   Colon cancer Father 11   Coronary artery disease Father        no MI   Prostate cancer Father    Diabetes Father    Hypertension Sister    Cancer Maternal Grandfather        GI   Diabetes Paternal Grandfather    Stroke Neg Hx    Esophageal cancer Neg Hx    Stomach cancer Neg Hx    Rectal cancer Neg Hx     ALLERGIES:  is allergic to shrimp [shellfish allergy], hydrocodone, and vicodin [hydrocodone-acetaminophen].  MEDICATIONS:  Current Outpatient Medications  Medication Sig Dispense Refill   acyclovir (ZOVIRAX) 400 MG tablet Take 1 tablet (400 mg total) by mouth 2 (two) times daily. 30 tablet 6   amLODipine (NORVASC) 10 MG tablet Take 1 tablet (10 mg total) by mouth daily. 30 tablet 0   benazepril (LOTENSIN) 40 MG tablet Take 1 tablet (40 mg total) by mouth daily. 30 tablet 0   cyclophosphamide (CYTOXAN) 50 MG capsule Take 10 capsules (500 mg total) by mouth once a week. Take with food to minimize GI upset. Take early in the day and maintain hydration. (Bring to cancer center and take with Velcade injection after nausea medications). 40 capsule 2   denosumab (PROLIA) 60 MG/ML SOLN injection Inject 60 mg into the skin every 6 (six) months. Administer in upper arm, thigh, or abdomen     iron polysaccharides (NIFEREX) 150 MG capsule Take 1 capsule (150 mg total) by mouth daily. 30 capsule 2    LORazepam (ATIVAN) 0.5 MG tablet Take 1 tablet (0.5 mg total) by mouth every 6 (six) hours as needed (Nausea or vomiting). 30 tablet 0   ondansetron (ZOFRAN) 8 MG tablet Take 8 mg by mouth 30 to 60 min prior to Cytoxan administration then take 8 mg twice daily as needed for nausea and vomiting. 30 tablet 1   prochlorperazine (COMPAZINE) 10 MG tablet Take 1 tablet (10 mg total) by mouth every 6 (six) hours as needed (Nausea or vomiting). 30 tablet 1   rosuvastatin (CRESTOR) 20 MG  tablet 1 tablet     No current facility-administered medications for this visit.    REVIEW OF SYSTEMS:   10 Point review of Systems was done is negative except as noted above.  PHYSICAL EXAMINATION :  .LMP 05/07/1999 (LMP Unknown)  Vitals:   09/17/21 1218  BP: 123/72  Pulse: 71  Resp: 20  Temp: 97.7 F (36.5 C)  SpO2: 100%   NAD GENERAL:alert, in no acute distress and comfortable SKIN: no acute rashes, no significant lesions EYES: conjunctiva are pink and non-injected, sclera anicteric NECK: supple, no JVD LYMPH:  no palpable lymphadenopathy in the cervical, axillary or inguinal regions LUNGS: clear to auscultation b/l with normal respiratory effort HEART: regular rate & rhythm ABDOMEN:  normoactive bowel sounds , non tender, not distended. Extremity: no pedal edema PSYCH: alert & oriented x 3 with fluent speech NEURO: no focal motor/sensory deficits  LABORATORY DATA:  I have reviewed the data as listed  .    Latest Ref Rng & Units 09/17/2021   11:34 AM 09/10/2021   11:24 AM 09/03/2021   11:21 AM  CBC  WBC 4.0 - 10.5 K/uL 3.4   3.9   3.6    Hemoglobin 12.0 - 15.0 g/dL 8.7   8.8   8.9    Hematocrit 36.0 - 46.0 % 25.2   26.1   25.8    Platelets 150 - 400 K/uL 229   197   204    .CBC    Component Value Date/Time   WBC 3.4 (L) 09/17/2021 1134   RBC 2.68 (L) 09/17/2021 1134   HGB 8.7 (L) 09/17/2021 1134   HGB 9.3 (L) 08/20/2021 1156   HCT 25.2 (L) 09/17/2021 1134   PLT 229 09/17/2021 1134   PLT  182 08/20/2021 1156   MCV 94.0 09/17/2021 1134   MCH 32.5 09/17/2021 1134   MCHC 34.5 09/17/2021 1134   RDW 16.2 (H) 09/17/2021 1134   LYMPHSABS 0.4 (L) 09/17/2021 1134   MONOABS 0.3 09/17/2021 1134   EOSABS 0.1 09/17/2021 1134   BASOSABS 0.1 09/17/2021 1134       Latest Ref Rng & Units 09/17/2021   11:34 AM 09/10/2021   11:24 AM 09/03/2021   11:21 AM  CMP  Glucose 70 - 99 mg/dL 153   102   102    BUN 8 - 23 mg/dL 26   35   35    Creatinine 0.44 - 1.00 mg/dL 1.20   1.36   1.18    Sodium 135 - 145 mmol/L 136   139   138    Potassium 3.5 - 5.1 mmol/L 3.4   3.5   3.8    Chloride 98 - 111 mmol/L 103   105   106    CO2 22 - 32 mmol/L 28   28   29     Calcium 8.9 - 10.3 mg/dL 8.9   8.9   9.0    Total Protein 6.5 - 8.1 g/dL 6.1   6.4   6.3    Total Bilirubin 0.3 - 1.2 mg/dL 0.4   0.5   0.4    Alkaline Phos 38 - 126 U/L 35   36   39    AST 15 - 41 U/L 26   26   29     ALT 0 - 44 U/L 22   23   26       Surgical Pathology  CASE: [id number]  PATIENT: Taneia Hosley  Bone Marrow Report  Clinical History: Multiple myeloma , Bence-jones proteinuria  (BH)      DIAGNOSIS:   BONE MARROW, ASPIRATE, CLOT, CORE:  -Normocellular bone marrow with plasma cell neoplasm  -See comment   PERIPHERAL BLOOD:  -Normocytic-normochromic anemia   COMMENT:   The bone marrow is generally normocellular for age with trilineage  hematopoiesis.  In this background, the plasma cells are increased in  number representing 12% of cells associated with small clusters in the  clot and biopsy sections.  The plasma cells display lambda light chain  restriction consistent with plasma cell neoplasm.  Correlation with  cytogenetic and FISH studies is recommended.     RADIOGRAPHIC STUDIES: I have personally reviewed the radiological images as listed and agreed with the findings in the report. No results found.  ASSESSMENT & PLAN:   71 year old female with history of hypertrophic cardiomyopathy,  hypertension, dyslipidemia with  #1  Recently diagnosed smoldering myeloma with renal LCDD Bone marrow biopsy with 12% lambda restricted plasma cells PET CT scan with no hypermetabolic bone lesions Cytogenetics normal female karyotype Molecular cytogenetics atypical t (11;14) Component     Latest Ref Rng & Units 01/30/2021  IgG (Immunoglobin G), Serum     586 - 1,602 mg/dL 610  IgA     64 - 422 mg/dL 91  IgM (Immunoglobulin M), Srm     26 - 217 mg/dL 79  Total Protein ELP     6.0 - 8.5 g/dL 6.8  Albumin SerPl Elph-Mcnc     2.9 - 4.4 g/dL 4.3  Alpha 1     0.0 - 0.4 g/dL 0.2  Alpha2 Glob SerPl Elph-Mcnc     0.4 - 1.0 g/dL 0.7  B-Globulin SerPl Elph-Mcnc     0.7 - 1.3 g/dL 1.0  Gamma Glob SerPl Elph-Mcnc     0.4 - 1.8 g/dL 0.6  M Protein SerPl Elph-Mcnc     Not Observed g/dL Not Observed  Globulin, Total     2.2 - 3.9 g/dL 2.5  Albumin/Glob SerPl     0.7 - 1.7 1.8 (H)  IFE 1      Comment  Please Note (HCV):      Comment  Kappa free light chain     3.3 - 19.4 mg/L 11.8  Lambda free light chains     5.7 - 26.3 mg/L 480.8 (H)  Kappa, lambda light chain ratio     0.26 - 1.65 0.02 (L)   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  History of hypertension, dyslipidemia #5  Normocytic anemia with some worsening with hemoglobin down to 8.9 likely from Cytoxan.  Previous mild anemia likely related to chronic kidney disease and plasma cell dyscrasia.  Plan -Labs done today reviewed in detail. CBC show Lymphocytopenia with WBC count of 3.4k and ALC of 0.4k, platelets are stable at 229k and hemoglobin was 8.7. Persistent normocytic anemia with RBC count of 2.68.  CMP stable. Creatinine elevated at 1.20 and GFR est. Low at 48 which is consistent with  chronic kidney disease. -We will start Daratumumab with next treatment in 1 week -Other than some anemia thrombocytopenia patient has no significant other toxicities from her CyBorD regimen at this time. -Patient appropriate to proceed with cycle 3-day 22 of CyBorD with weekly Velcade and dose reduced dexamethasone at 20  mg weekly. -We will continue the same supportive medications -Treatment orders were reviewed and signed and treatment was coordinated with her nurses in infusion. -She is following with nephrology at [facility]. -We discussed taking an Imodium and replacing electrolytes with Gatorade or Pedialyte for future diarrhea spells. We also discussed taking an over-the-counter probiotic or eating probiotic yogurt.  Follow-up Sent high priority scheduling to scehdule [place] daratumumab in addition to CyBorD with next treatment  The total time spent in the appointment was 32 minutes*.  All of the patient's questions were answered with apparent satisfaction. The patient knows to call the clinic with any problems, questions or concerns.   Sullivan Lone MD MS AAHIVMS [facility] Hematology/Oncology Physician [facility]  .*Total Encounter Time as defined by the Centers for Medicare and Medicaid Services includes, in addition to the face-to-face time of a patient visit (documented in the note above) non-face-to-face time: obtaining and reviewing outside history, ordering and reviewing medications, tests or procedures, care coordination (communications with other health care professionals or caregivers) and documentation in the medical record.  I, Melene Muller, am acting as scribe for Dr. Sullivan Lone, MD. .I have reviewed the above documentation for accuracy and completeness, and I agree with the above. Brunetta Genera MD

## 2021-09-18 ENCOUNTER — Other Ambulatory Visit (HOSPITAL_COMMUNITY): Payer: Self-pay

## 2021-09-19 ENCOUNTER — Other Ambulatory Visit (HOSPITAL_COMMUNITY): Payer: Self-pay

## 2021-09-19 DIAGNOSIS — M810 Age-related osteoporosis without current pathological fracture: Secondary | ICD-10-CM | POA: Diagnosis not present

## 2021-09-20 ENCOUNTER — Other Ambulatory Visit: Payer: Self-pay | Admitting: Hematology

## 2021-09-23 ENCOUNTER — Encounter: Payer: Self-pay | Admitting: Hematology

## 2021-09-24 ENCOUNTER — Ambulatory Visit: Payer: Medicare Other | Admitting: Hematology

## 2021-09-24 ENCOUNTER — Inpatient Hospital Stay: Payer: Medicare Other

## 2021-09-24 ENCOUNTER — Other Ambulatory Visit: Payer: Self-pay

## 2021-09-24 VITALS — BP 105/64 | HR 72 | Temp 98.3°F | Resp 17 | Wt 120.5 lb

## 2021-09-24 DIAGNOSIS — N189 Chronic kidney disease, unspecified: Secondary | ICD-10-CM | POA: Diagnosis not present

## 2021-09-24 DIAGNOSIS — I10 Essential (primary) hypertension: Secondary | ICD-10-CM | POA: Diagnosis not present

## 2021-09-24 DIAGNOSIS — Z5112 Encounter for antineoplastic immunotherapy: Secondary | ICD-10-CM | POA: Diagnosis not present

## 2021-09-24 DIAGNOSIS — C9000 Multiple myeloma not having achieved remission: Secondary | ICD-10-CM

## 2021-09-24 DIAGNOSIS — Z7189 Other specified counseling: Secondary | ICD-10-CM

## 2021-09-24 DIAGNOSIS — D8989 Other specified disorders involving the immune mechanism, not elsewhere classified: Secondary | ICD-10-CM

## 2021-09-24 DIAGNOSIS — D649 Anemia, unspecified: Secondary | ICD-10-CM | POA: Diagnosis not present

## 2021-09-24 DIAGNOSIS — R803 Bence Jones proteinuria: Secondary | ICD-10-CM | POA: Diagnosis not present

## 2021-09-24 LAB — CMP (CANCER CENTER ONLY)
ALT: 20 U/L (ref 0–44)
AST: 23 U/L (ref 15–41)
Albumin: 4.2 g/dL (ref 3.5–5.0)
Alkaline Phosphatase: 35 U/L — ABNORMAL LOW (ref 38–126)
Anion gap: 6 (ref 5–15)
BUN: 26 mg/dL — ABNORMAL HIGH (ref 8–23)
CO2: 27 mmol/L (ref 22–32)
Calcium: 9.2 mg/dL (ref 8.9–10.3)
Chloride: 101 mmol/L (ref 98–111)
Creatinine: 1.18 mg/dL — ABNORMAL HIGH (ref 0.44–1.00)
GFR, Estimated: 49 mL/min — ABNORMAL LOW (ref >60)
Glucose, Bld: 136 mg/dL — ABNORMAL HIGH (ref 70–99)
Potassium: 4.5 mmol/L (ref 3.5–5.1)
Sodium: 134 mmol/L — ABNORMAL LOW (ref 135–145)
Total Bilirubin: 0.5 mg/dL (ref 0.3–1.2)
Total Protein: 6.6 g/dL (ref 6.5–8.1)

## 2021-09-24 LAB — TYPE AND SCREEN
ABO/RH(D): A POS
Antibody Screen: NEGATIVE

## 2021-09-24 LAB — CBC WITH DIFFERENTIAL/PLATELET
Abs Immature Granulocytes: 0.05 10*3/uL (ref 0.00–0.07)
Basophils Absolute: 0 10*3/uL (ref 0.0–0.1)
Basophils Relative: 1 %
Eosinophils Absolute: 0.1 10*3/uL (ref 0.0–0.5)
Eosinophils Relative: 1 %
HCT: 25.5 % — ABNORMAL LOW (ref 36.0–46.0)
Hemoglobin: 8.9 g/dL — ABNORMAL LOW (ref 12.0–15.0)
Immature Granulocytes: 1 %
Lymphocytes Relative: 9 %
Lymphs Abs: 0.5 10*3/uL — ABNORMAL LOW (ref 0.7–4.0)
MCH: 32.7 pg (ref 26.0–34.0)
MCHC: 34.9 g/dL (ref 30.0–36.0)
MCV: 93.8 fL (ref 80.0–100.0)
Monocytes Absolute: 0.5 10*3/uL (ref 0.1–1.0)
Monocytes Relative: 9 %
Neutro Abs: 4.5 10*3/uL (ref 1.7–7.7)
Neutrophils Relative %: 79 %
Platelets: 213 10*3/uL (ref 150–400)
RBC: 2.72 MIL/uL — ABNORMAL LOW (ref 3.87–5.11)
RDW: 16.7 % — ABNORMAL HIGH (ref 11.5–15.5)
WBC: 5.6 10*3/uL (ref 4.0–10.5)
nRBC: 0 % (ref 0.0–0.2)

## 2021-09-24 LAB — PRETREATMENT RBC PHENOTYPE: DAT, IgG: NEGATIVE

## 2021-09-24 MED ORDER — BORTEZOMIB CHEMO SQ INJECTION 3.5 MG (2.5MG/ML)
1.5000 mg/m2 | Freq: Once | INTRAMUSCULAR | Status: AC
  Administered 2021-09-24: 2.25 mg via SUBCUTANEOUS
  Filled 2021-09-24: qty 0.9

## 2021-09-24 MED ORDER — DEXAMETHASONE 4 MG PO TABS
20.0000 mg | ORAL_TABLET | Freq: Once | ORAL | Status: AC
  Administered 2021-09-24: 20 mg via ORAL
  Filled 2021-09-24: qty 5

## 2021-09-24 MED ORDER — MONTELUKAST SODIUM 10 MG PO TABS
10.0000 mg | ORAL_TABLET | Freq: Once | ORAL | Status: AC
  Administered 2021-09-24: 10 mg via ORAL
  Filled 2021-09-24: qty 1

## 2021-09-24 MED ORDER — DIPHENHYDRAMINE HCL 25 MG PO CAPS
50.0000 mg | ORAL_CAPSULE | Freq: Once | ORAL | Status: AC
  Administered 2021-09-24: 50 mg via ORAL
  Filled 2021-09-24: qty 2

## 2021-09-24 MED ORDER — ONDANSETRON HCL 8 MG PO TABS
12.0000 mg | ORAL_TABLET | Freq: Once | ORAL | Status: AC
  Administered 2021-09-24: 12 mg via ORAL
  Filled 2021-09-24: qty 2

## 2021-09-24 MED ORDER — ACETAMINOPHEN 325 MG PO TABS
650.0000 mg | ORAL_TABLET | Freq: Once | ORAL | Status: AC
  Administered 2021-09-24: 650 mg via ORAL
  Filled 2021-09-24: qty 2

## 2021-09-24 MED ORDER — DARATUMUMAB-HYALURONIDASE-FIHJ 1800-30000 MG-UT/15ML ~~LOC~~ SOLN
1800.0000 mg | Freq: Once | SUBCUTANEOUS | Status: AC
  Administered 2021-09-24: 1800 mg via SUBCUTANEOUS
  Filled 2021-09-24: qty 15

## 2021-09-24 MED ORDER — FAMOTIDINE 20 MG PO TABS
20.0000 mg | ORAL_TABLET | Freq: Once | ORAL | Status: AC
  Administered 2021-09-24: 20 mg via ORAL
  Filled 2021-09-24: qty 1

## 2021-09-24 NOTE — Patient Instructions (Signed)
[facility]  Discharge Instructions: Thank you for choosing [facility] to provide your oncology and hematology care.   If you have a lab appointment with the [facility], please go directly to the [facility] and check in at the registration area.   Wear comfortable clothing and clothing appropriate for easy access to any Portacath or PICC line.   We strive to give you quality time with your provider. You may need to reschedule your appointment if you arrive late (15 or more minutes).  Arriving late affects you and other patients whose appointments are after yours.  Also, if you miss three or more appointments without notifying the office, you may be dismissed from the clinic at the provider's discretion.      For prescription refill requests, have your pharmacy contact our office and allow 72 hours for refills to be completed.    Today you received the following chemotherapy and/or immunotherapy agents: Velcade and Darzalex Faspro      To help prevent nausea and vomiting after your treatment, we encourage you to take your nausea medication as directed.  BELOW ARE SYMPTOMS THAT SHOULD BE REPORTED IMMEDIATELY: *FEVER GREATER THAN 100.4 F (38 C) OR HIGHER *CHILLS OR SWEATING *NAUSEA AND VOMITING THAT IS NOT CONTROLLED WITH YOUR NAUSEA MEDICATION *UNUSUAL SHORTNESS OF BREATH *UNUSUAL BRUISING OR BLEEDING *URINARY PROBLEMS (pain or burning when urinating, or frequent urination) *BOWEL PROBLEMS (unusual diarrhea, constipation, pain near the anus) TENDERNESS IN MOUTH AND THROAT WITH OR WITHOUT PRESENCE OF ULCERS (sore throat, sores in mouth, or a toothache) UNUSUAL RASH, SWELLING OR PAIN  UNUSUAL VAGINAL DISCHARGE OR ITCHING   Items with * indicate a potential emergency and should be followed up as soon as possible or go to the Emergency Department if any problems should occur.  Please show the CHEMOTHERAPY ALERT CARD or IMMUNOTHERAPY ALERT  CARD at check-in to the Emergency Department and triage nurse.  Should you have questions after your visit or need to cancel or reschedule your appointment, please contact [facility]  Dept: [phone]  and follow the prompts.  Office hours are 8:00 a.m. to 4:30 p.m. Monday - Friday. Please note that voicemails left after 4:00 p.m. may not be returned until the following business day.  We are closed weekends and major holidays. You have access to a nurse at all times for urgent questions. Please call the main number to the clinic Dept: [phone] and follow the prompts.   For any non-urgent questions, you may also contact your provider using MyChart. We now offer e-Visits for anyone 48 and older to request care online for non-urgent symptoms. For details visit [email or web address].   Also download the MyChart app! Go to the app store, search "MyChart", open the app, select [facility], and log in with your MyChart username and password.  Due to Covid, a mask is required upon entering the hospital/clinic. If you do not have a mask, one will be given to you upon arrival. For doctor visits, patients may have 1 support person aged 2 or older with them. For treatment visits, patients cannot have anyone with them due to current Covid guidelines and our immunocompromised population.   Daratumumab; Hyaluronidase Injection What is this medication? DARATUMUMAB; HYALURONIDASE (dar a toom ue mab / hye al ur ON i dase) is a monoclonal antibody. Hyaluronidase is used to improve the effects of daratumumab. It treats certain types of cancer. Some of the cancers treated are  multiple myeloma and light-chain amyloidosis. This medicine may be used for other purposes; ask your health care provider or pharmacist if you have questions. COMMON BRAND NAME(S): DARZALEX FASPRO What should I tell my care team before I take this medication? They need to know if you have any of these  conditions: heart disease infection especially a viral infection such as chickenpox, cold sores, herpes, or hepatitis B lung or breathing disease an unusual or allergic reaction to daratumumab, hyaluronidase, other medicines, foods, dyes, or preservatives pregnant or trying to get pregnant breast-feeding How should I use this medication? This medicine is for injection under the skin. It is given by a health care professional in a hospital or clinic setting. Talk to your pediatrician regarding the use of this medicine in children. Special care may be needed. Overdosage: If you think you have taken too much of this medicine contact a poison control center or emergency room at once. NOTE: This medicine is only for you. Do not share this medicine with others. What if I miss a dose? Keep appointments for follow-up doses as directed. It is important not to miss your dose. Call your doctor or health care professional if you are unable to keep an appointment. What may interact with this medication? Interactions have not been studied. This list may not describe all possible interactions. Give your health care provider a list of all the medicines, herbs, non-prescription drugs, or dietary supplements you use. Also tell them if you smoke, drink alcohol, or use illegal drugs. Some items may interact with your medicine. What should I watch for while using this medication? Your condition will be monitored carefully while you are receiving this medicine. This medicine can cause serious allergic reactions. To reduce your risk, your health care provider may give you other medicine to take before receiving this one. Be sure to follow the directions from your health care provider. This medicine can affect the results of blood tests to match your blood type. These changes can last for up to 6 months after the final dose. Your healthcare provider will do blood tests to match your blood type before you start  treatment. Tell all of your healthcare providers that you are being treated with this medicine before receiving a blood transfusion. This medicine can affect the results of some tests used to determine treatment response; extra tests may be needed to evaluate response. Do not become pregnant while taking this medicine or for 3 months after stopping it. Women should inform their health care provider if they wish to become pregnant or think they might be pregnant. There is a potential for serious side effects to an unborn child. Talk to your health care provider for more information. Do not breast-feed an infant while taking this medicine. What side effects may I notice from receiving this medication? Side effects that you should report to your care team as soon as possible: Allergic reactions--skin rash, itching or hives, swelling of the face, lips, or tongue Blood clot--chest pain, shortness of breath, pain, swelling or warmth in the leg Blurred vision Fast, irregular heartbeat Infection--fever, chills, cough, sore throat, pain or trouble passing urine Injection reactions--dizziness, fast heartbeat, feeling faint or lightheaded, falls, headache, increase in blood pressure, nausea, vomiting, or wheezing or trouble breathing with loud or whistling sounds Low red blood cell counts--trouble breathing, feeling faint, lightheaded or falls, unusually weak or tired Unusual bleeding or bruising Side effects that usually do not require medical attention (report these to your care team  if they continue or are bothersome): Back pain Constipation Diarrhea Pain, tingling, numbness in the hands or feet Pain, redness, or irritation at site where injected Muscle cramp or pain Swelling of the ankles, feet, hands Tiredness Trouble sleeping This list may not describe all possible side effects. Call your doctor for medical advice about side effects. You may report side effects to FDA at 1-800-FDA-1088. Where  should I keep my medication? This drug is given in a hospital or clinic and will not be stored at home. NOTE: This sheet is a summary. It may not cover all possible information. If you have questions about this medicine, talk to your doctor, pharmacist, or health care provider.  2023 Elsevier/Gold Standard (2021-03-23 00:00:00)

## 2021-09-24 NOTE — Progress Notes (Signed)
Pt took 10 PO caps of '50mg'$  cytoxan while in infusion today. Verified with Real Cons, RN.

## 2021-09-25 ENCOUNTER — Telehealth: Payer: Self-pay | Admitting: *Deleted

## 2021-09-25 NOTE — Telephone Encounter (Signed)
-----   Message from Charleston Poot, RN sent at 09/24/2021  2:13 PM EDT ----- Regarding: First Time/ Darzalex Faspro/ Dr Irene Limbo pt Hello,   Pt had first time darzalex faspro today. Tolerated procedure well.   Thanks, Libbie K.

## 2021-09-25 NOTE — Telephone Encounter (Signed)
Called pt to see how she did with her recent treatment.  She reports that she has done well with no new symptoms  She reports that everything that she has had were from other treatments.  She has had some nausea from Cyclophosphamide but nausea meds helped.  She reports knowing how to reach [place] if needed & was appreciative of call.

## 2021-09-26 DIAGNOSIS — K644 Residual hemorrhoidal skin tags: Secondary | ICD-10-CM | POA: Diagnosis not present

## 2021-09-26 DIAGNOSIS — G4709 Other insomnia: Secondary | ICD-10-CM | POA: Diagnosis not present

## 2021-09-26 DIAGNOSIS — K5900 Constipation, unspecified: Secondary | ICD-10-CM | POA: Diagnosis not present

## 2021-09-26 DIAGNOSIS — R7301 Impaired fasting glucose: Secondary | ICD-10-CM | POA: Diagnosis not present

## 2021-09-26 DIAGNOSIS — D8989 Other specified disorders involving the immune mechanism, not elsewhere classified: Secondary | ICD-10-CM | POA: Diagnosis not present

## 2021-09-26 DIAGNOSIS — C9000 Multiple myeloma not having achieved remission: Secondary | ICD-10-CM | POA: Diagnosis not present

## 2021-09-26 DIAGNOSIS — N1831 Chronic kidney disease, stage 3a: Secondary | ICD-10-CM | POA: Diagnosis not present

## 2021-10-02 ENCOUNTER — Inpatient Hospital Stay: Payer: Medicare Other

## 2021-10-02 ENCOUNTER — Ambulatory Visit: Payer: Medicare Other

## 2021-10-02 ENCOUNTER — Other Ambulatory Visit: Payer: Medicare Other

## 2021-10-02 ENCOUNTER — Other Ambulatory Visit: Payer: Self-pay

## 2021-10-02 VITALS — BP 111/73 | HR 63 | Temp 97.7°F | Resp 16 | Wt 122.5 lb

## 2021-10-02 DIAGNOSIS — N189 Chronic kidney disease, unspecified: Secondary | ICD-10-CM | POA: Diagnosis not present

## 2021-10-02 DIAGNOSIS — C9000 Multiple myeloma not having achieved remission: Secondary | ICD-10-CM | POA: Diagnosis not present

## 2021-10-02 DIAGNOSIS — R803 Bence Jones proteinuria: Secondary | ICD-10-CM | POA: Diagnosis not present

## 2021-10-02 DIAGNOSIS — D649 Anemia, unspecified: Secondary | ICD-10-CM | POA: Diagnosis not present

## 2021-10-02 DIAGNOSIS — Z7189 Other specified counseling: Secondary | ICD-10-CM

## 2021-10-02 DIAGNOSIS — D8989 Other specified disorders involving the immune mechanism, not elsewhere classified: Secondary | ICD-10-CM

## 2021-10-02 DIAGNOSIS — Z5112 Encounter for antineoplastic immunotherapy: Secondary | ICD-10-CM | POA: Diagnosis not present

## 2021-10-02 DIAGNOSIS — I10 Essential (primary) hypertension: Secondary | ICD-10-CM | POA: Diagnosis not present

## 2021-10-02 LAB — CBC WITH DIFFERENTIAL/PLATELET
Abs Immature Granulocytes: 0.02 10*3/uL (ref 0.00–0.07)
Basophils Absolute: 0 10*3/uL (ref 0.0–0.1)
Basophils Relative: 1 %
Eosinophils Absolute: 0.1 10*3/uL (ref 0.0–0.5)
Eosinophils Relative: 5 %
HCT: 23.2 % — ABNORMAL LOW (ref 36.0–46.0)
Hemoglobin: 8.1 g/dL — ABNORMAL LOW (ref 12.0–15.0)
Immature Granulocytes: 1 %
Lymphocytes Relative: 15 %
Lymphs Abs: 0.3 10*3/uL — ABNORMAL LOW (ref 0.7–4.0)
MCH: 33.5 pg (ref 26.0–34.0)
MCHC: 34.9 g/dL (ref 30.0–36.0)
MCV: 95.9 fL (ref 80.0–100.0)
Monocytes Absolute: 0.4 10*3/uL (ref 0.1–1.0)
Monocytes Relative: 19 %
Neutro Abs: 1.3 10*3/uL — ABNORMAL LOW (ref 1.7–7.7)
Neutrophils Relative %: 59 %
Platelets: 195 10*3/uL (ref 150–400)
RBC: 2.42 MIL/uL — ABNORMAL LOW (ref 3.87–5.11)
RDW: 16.8 % — ABNORMAL HIGH (ref 11.5–15.5)
WBC: 2.1 10*3/uL — ABNORMAL LOW (ref 4.0–10.5)
nRBC: 0 % (ref 0.0–0.2)

## 2021-10-02 LAB — CMP (CANCER CENTER ONLY)
ALT: 17 U/L (ref 0–44)
AST: 22 U/L (ref 15–41)
Albumin: 4 g/dL (ref 3.5–5.0)
Alkaline Phosphatase: 36 U/L — ABNORMAL LOW (ref 38–126)
Anion gap: 5 (ref 5–15)
BUN: 21 mg/dL (ref 8–23)
CO2: 29 mmol/L (ref 22–32)
Calcium: 9 mg/dL (ref 8.9–10.3)
Chloride: 100 mmol/L (ref 98–111)
Creatinine: 1.1 mg/dL — ABNORMAL HIGH (ref 0.44–1.00)
GFR, Estimated: 53 mL/min — ABNORMAL LOW (ref >60)
Glucose, Bld: 101 mg/dL — ABNORMAL HIGH (ref 70–99)
Potassium: 4 mmol/L (ref 3.5–5.1)
Sodium: 134 mmol/L — ABNORMAL LOW (ref 135–145)
Total Bilirubin: 0.4 mg/dL (ref 0.3–1.2)
Total Protein: 6 g/dL — ABNORMAL LOW (ref 6.5–8.1)

## 2021-10-02 MED ORDER — DEXAMETHASONE 4 MG PO TABS
20.0000 mg | ORAL_TABLET | Freq: Once | ORAL | Status: AC
  Administered 2021-10-02: 20 mg via ORAL
  Filled 2021-10-02: qty 5

## 2021-10-02 MED ORDER — DIPHENHYDRAMINE HCL 25 MG PO CAPS
50.0000 mg | ORAL_CAPSULE | Freq: Once | ORAL | Status: AC
  Administered 2021-10-02: 50 mg via ORAL
  Filled 2021-10-02: qty 2

## 2021-10-02 MED ORDER — FAMOTIDINE 20 MG PO TABS
20.0000 mg | ORAL_TABLET | Freq: Once | ORAL | Status: AC
  Administered 2021-10-02: 20 mg via ORAL
  Filled 2021-10-02: qty 1

## 2021-10-02 MED ORDER — BORTEZOMIB CHEMO SQ INJECTION 3.5 MG (2.5MG/ML)
1.5000 mg/m2 | Freq: Once | INTRAMUSCULAR | Status: AC
  Administered 2021-10-02: 2.25 mg via SUBCUTANEOUS
  Filled 2021-10-02: qty 0.9

## 2021-10-02 MED ORDER — ACETAMINOPHEN 325 MG PO TABS
650.0000 mg | ORAL_TABLET | Freq: Once | ORAL | Status: AC
  Administered 2021-10-02: 650 mg via ORAL
  Filled 2021-10-02: qty 2

## 2021-10-02 MED ORDER — ONDANSETRON HCL 8 MG PO TABS
12.0000 mg | ORAL_TABLET | Freq: Once | ORAL | Status: AC
  Administered 2021-10-02: 12 mg via ORAL
  Filled 2021-10-02: qty 2

## 2021-10-02 MED ORDER — DARATUMUMAB-HYALURONIDASE-FIHJ 1800-30000 MG-UT/15ML ~~LOC~~ SOLN
1800.0000 mg | Freq: Once | SUBCUTANEOUS | Status: AC
  Administered 2021-10-02: 1800 mg via SUBCUTANEOUS
  Filled 2021-10-02: qty 15

## 2021-10-02 MED ORDER — MONTELUKAST SODIUM 10 MG PO TABS
10.0000 mg | ORAL_TABLET | Freq: Once | ORAL | Status: AC
  Administered 2021-10-02: 10 mg via ORAL
  Filled 2021-10-02: qty 1

## 2021-10-02 NOTE — Patient Instructions (Signed)
[facility]  Discharge Instructions: Thank you for choosing [facility] to provide your oncology and hematology care.   If you have a lab appointment with the [facility], please go directly to the [facility] and check in at the registration area.   Wear comfortable clothing and clothing appropriate for easy access to any Portacath or PICC line.   We strive to give you quality time with your provider. You may need to reschedule your appointment if you arrive late (15 or more minutes).  Arriving late affects you and other patients whose appointments are after yours.  Also, if you miss three or more appointments without notifying the office, you may be dismissed from the clinic at the provider's discretion.      For prescription refill requests, have your pharmacy contact our office and allow 72 hours for refills to be completed.    Today you received the following chemotherapy and/or immunotherapy agents: Bortezomib (Velcade), Daratumumab (Darrzalex Faspro)    To help prevent nausea and vomiting after your treatment, we encourage you to take your nausea medication as directed.  BELOW ARE SYMPTOMS THAT SHOULD BE REPORTED IMMEDIATELY: *FEVER GREATER THAN 100.4 F (38 C) OR HIGHER *CHILLS OR SWEATING *NAUSEA AND VOMITING THAT IS NOT CONTROLLED WITH YOUR NAUSEA MEDICATION *UNUSUAL SHORTNESS OF BREATH *UNUSUAL BRUISING OR BLEEDING *URINARY PROBLEMS (pain or burning when urinating, or frequent urination) *BOWEL PROBLEMS (unusual diarrhea, constipation, pain near the anus) TENDERNESS IN MOUTH AND THROAT WITH OR WITHOUT PRESENCE OF ULCERS (sore throat, sores in mouth, or a toothache) UNUSUAL RASH, SWELLING OR PAIN  UNUSUAL VAGINAL DISCHARGE OR ITCHING   Items with * indicate a potential emergency and should be followed up as soon as possible or go to the Emergency Department if any problems should occur.  Please show the CHEMOTHERAPY ALERT CARD or  IMMUNOTHERAPY ALERT CARD at check-in to the Emergency Department and triage nurse.  Should you have questions after your visit or need to cancel or reschedule your appointment, please contact [facility]  Dept: [phone]  and follow the prompts.  Office hours are 8:00 a.m. to 4:30 p.m. Monday - Friday. Please note that voicemails left after 4:00 p.m. may not be returned until the following business day.  We are closed weekends and major holidays. You have access to a nurse at all times for urgent questions. Please call the main number to the clinic Dept: [phone] and follow the prompts.   For any non-urgent questions, you may also contact your provider using MyChart. We now offer e-Visits for anyone 53 and older to request care online for non-urgent symptoms. For details visit [email or web address].   Also download the MyChart app! Go to the app store, search "MyChart", open the app, select [facility], and log in with your MyChart username and password.  Due to Covid, a mask is required upon entering the hospital/clinic. If you do not have a mask, one will be given to you upon arrival. For doctor visits, patients may have 1 support person aged 61 or older with them. For treatment visits, patients cannot have anyone with them due to current Covid guidelines and our immunocompromised population.

## 2021-10-02 NOTE — Progress Notes (Signed)
Ok to treat with ANC 1.3 per Dr Irene Limbo.  Pt observed 1 hr post daratumumab.  No complains.  Tolerated well. VSS

## 2021-10-02 NOTE — Progress Notes (Signed)
MD aware of labs today. ANC: 0.3. Pt ok for tx.

## 2021-10-08 ENCOUNTER — Inpatient Hospital Stay (HOSPITAL_BASED_OUTPATIENT_CLINIC_OR_DEPARTMENT_OTHER): Payer: Medicare Other | Admitting: Hematology

## 2021-10-08 ENCOUNTER — Ambulatory Visit: Payer: Medicare Other

## 2021-10-08 ENCOUNTER — Other Ambulatory Visit: Payer: Self-pay

## 2021-10-08 ENCOUNTER — Inpatient Hospital Stay: Payer: Medicare Other | Attending: Hematology

## 2021-10-08 ENCOUNTER — Other Ambulatory Visit (HOSPITAL_COMMUNITY): Payer: Self-pay

## 2021-10-08 ENCOUNTER — Other Ambulatory Visit: Payer: Medicare Other

## 2021-10-08 ENCOUNTER — Inpatient Hospital Stay: Payer: Medicare Other

## 2021-10-08 VITALS — BP 132/72 | HR 66 | Temp 97.6°F | Resp 18 | Ht 63.0 in | Wt 119.5 lb

## 2021-10-08 VITALS — BP 116/74 | HR 62 | Temp 97.8°F | Resp 18

## 2021-10-08 DIAGNOSIS — Z9049 Acquired absence of other specified parts of digestive tract: Secondary | ICD-10-CM | POA: Diagnosis not present

## 2021-10-08 DIAGNOSIS — Z7189 Other specified counseling: Secondary | ICD-10-CM

## 2021-10-08 DIAGNOSIS — K5900 Constipation, unspecified: Secondary | ICD-10-CM | POA: Diagnosis not present

## 2021-10-08 DIAGNOSIS — Z8042 Family history of malignant neoplasm of prostate: Secondary | ICD-10-CM | POA: Insufficient documentation

## 2021-10-08 DIAGNOSIS — Z79899 Other long term (current) drug therapy: Secondary | ICD-10-CM | POA: Insufficient documentation

## 2021-10-08 DIAGNOSIS — Z87891 Personal history of nicotine dependence: Secondary | ICD-10-CM | POA: Insufficient documentation

## 2021-10-08 DIAGNOSIS — R197 Diarrhea, unspecified: Secondary | ICD-10-CM | POA: Diagnosis not present

## 2021-10-08 DIAGNOSIS — Z801 Family history of malignant neoplasm of trachea, bronchus and lung: Secondary | ICD-10-CM | POA: Insufficient documentation

## 2021-10-08 DIAGNOSIS — Z833 Family history of diabetes mellitus: Secondary | ICD-10-CM | POA: Insufficient documentation

## 2021-10-08 DIAGNOSIS — Z853 Personal history of malignant neoplasm of breast: Secondary | ICD-10-CM | POA: Diagnosis not present

## 2021-10-08 DIAGNOSIS — R803 Bence Jones proteinuria: Secondary | ICD-10-CM | POA: Diagnosis not present

## 2021-10-08 DIAGNOSIS — I422 Other hypertrophic cardiomyopathy: Secondary | ICD-10-CM | POA: Insufficient documentation

## 2021-10-08 DIAGNOSIS — E785 Hyperlipidemia, unspecified: Secondary | ICD-10-CM | POA: Insufficient documentation

## 2021-10-08 DIAGNOSIS — Z8249 Family history of ischemic heart disease and other diseases of the circulatory system: Secondary | ICD-10-CM | POA: Insufficient documentation

## 2021-10-08 DIAGNOSIS — D8989 Other specified disorders involving the immune mechanism, not elsewhere classified: Secondary | ICD-10-CM

## 2021-10-08 DIAGNOSIS — I129 Hypertensive chronic kidney disease with stage 1 through stage 4 chronic kidney disease, or unspecified chronic kidney disease: Secondary | ICD-10-CM | POA: Insufficient documentation

## 2021-10-08 DIAGNOSIS — C9000 Multiple myeloma not having achieved remission: Secondary | ICD-10-CM | POA: Diagnosis not present

## 2021-10-08 DIAGNOSIS — D649 Anemia, unspecified: Secondary | ICD-10-CM

## 2021-10-08 DIAGNOSIS — D631 Anemia in chronic kidney disease: Secondary | ICD-10-CM | POA: Diagnosis not present

## 2021-10-08 DIAGNOSIS — Z8616 Personal history of COVID-19: Secondary | ICD-10-CM | POA: Diagnosis not present

## 2021-10-08 DIAGNOSIS — Z885 Allergy status to narcotic agent status: Secondary | ICD-10-CM | POA: Insufficient documentation

## 2021-10-08 DIAGNOSIS — N189 Chronic kidney disease, unspecified: Secondary | ICD-10-CM | POA: Insufficient documentation

## 2021-10-08 DIAGNOSIS — Z800 Family history of malignant neoplasm of digestive organs: Secondary | ICD-10-CM | POA: Diagnosis not present

## 2021-10-08 DIAGNOSIS — D696 Thrombocytopenia, unspecified: Secondary | ICD-10-CM | POA: Insufficient documentation

## 2021-10-08 DIAGNOSIS — Z9012 Acquired absence of left breast and nipple: Secondary | ICD-10-CM | POA: Insufficient documentation

## 2021-10-08 DIAGNOSIS — Z85828 Personal history of other malignant neoplasm of skin: Secondary | ICD-10-CM | POA: Insufficient documentation

## 2021-10-08 DIAGNOSIS — Z5112 Encounter for antineoplastic immunotherapy: Secondary | ICD-10-CM | POA: Diagnosis not present

## 2021-10-08 LAB — CBC WITH DIFFERENTIAL/PLATELET
Abs Immature Granulocytes: 0.02 10*3/uL (ref 0.00–0.07)
Basophils Absolute: 0 10*3/uL (ref 0.0–0.1)
Basophils Relative: 1 %
Eosinophils Absolute: 0.1 10*3/uL (ref 0.0–0.5)
Eosinophils Relative: 4 %
HCT: 24.6 % — ABNORMAL LOW (ref 36.0–46.0)
Hemoglobin: 8.4 g/dL — ABNORMAL LOW (ref 12.0–15.0)
Immature Granulocytes: 1 %
Lymphocytes Relative: 11 %
Lymphs Abs: 0.3 10*3/uL — ABNORMAL LOW (ref 0.7–4.0)
MCH: 32.8 pg (ref 26.0–34.0)
MCHC: 34.1 g/dL (ref 30.0–36.0)
MCV: 96.1 fL (ref 80.0–100.0)
Monocytes Absolute: 0.5 10*3/uL (ref 0.1–1.0)
Monocytes Relative: 17 %
Neutro Abs: 1.9 10*3/uL (ref 1.7–7.7)
Neutrophils Relative %: 66 %
Platelets: 193 10*3/uL (ref 150–400)
RBC: 2.56 MIL/uL — ABNORMAL LOW (ref 3.87–5.11)
RDW: 16.6 % — ABNORMAL HIGH (ref 11.5–15.5)
WBC: 2.9 10*3/uL — ABNORMAL LOW (ref 4.0–10.5)
nRBC: 0 % (ref 0.0–0.2)

## 2021-10-08 LAB — CMP (CANCER CENTER ONLY)
ALT: 17 U/L (ref 0–44)
AST: 22 U/L (ref 15–41)
Albumin: 4 g/dL (ref 3.5–5.0)
Alkaline Phosphatase: 37 U/L — ABNORMAL LOW (ref 38–126)
Anion gap: 6 (ref 5–15)
BUN: 21 mg/dL (ref 8–23)
CO2: 27 mmol/L (ref 22–32)
Calcium: 9.2 mg/dL (ref 8.9–10.3)
Chloride: 102 mmol/L (ref 98–111)
Creatinine: 1.2 mg/dL — ABNORMAL HIGH (ref 0.44–1.00)
GFR, Estimated: 48 mL/min — ABNORMAL LOW (ref >60)
Glucose, Bld: 121 mg/dL — ABNORMAL HIGH (ref 70–99)
Potassium: 3.9 mmol/L (ref 3.5–5.1)
Sodium: 135 mmol/L (ref 135–145)
Total Bilirubin: 0.4 mg/dL (ref 0.3–1.2)
Total Protein: 5.8 g/dL — ABNORMAL LOW (ref 6.5–8.1)

## 2021-10-08 MED ORDER — FAMOTIDINE 20 MG PO TABS
20.0000 mg | ORAL_TABLET | Freq: Once | ORAL | Status: AC
  Administered 2021-10-08: 20 mg via ORAL
  Filled 2021-10-08: qty 1

## 2021-10-08 MED ORDER — DIPHENHYDRAMINE HCL 25 MG PO CAPS
50.0000 mg | ORAL_CAPSULE | Freq: Once | ORAL | Status: AC
  Administered 2021-10-08: 50 mg via ORAL
  Filled 2021-10-08: qty 2

## 2021-10-08 MED ORDER — DEXAMETHASONE 4 MG PO TABS
20.0000 mg | ORAL_TABLET | Freq: Once | ORAL | Status: AC
  Administered 2021-10-08: 20 mg via ORAL
  Filled 2021-10-08: qty 5

## 2021-10-08 MED ORDER — BORTEZOMIB CHEMO SQ INJECTION 3.5 MG (2.5MG/ML)
1.5000 mg/m2 | Freq: Once | INTRAMUSCULAR | Status: AC
  Administered 2021-10-08: 2.25 mg via SUBCUTANEOUS
  Filled 2021-10-08: qty 0.9

## 2021-10-08 MED ORDER — MONTELUKAST SODIUM 10 MG PO TABS
10.0000 mg | ORAL_TABLET | Freq: Once | ORAL | Status: AC
  Administered 2021-10-08: 10 mg via ORAL
  Filled 2021-10-08: qty 1

## 2021-10-08 MED ORDER — ONDANSETRON HCL 8 MG PO TABS
12.0000 mg | ORAL_TABLET | Freq: Once | ORAL | Status: AC
  Administered 2021-10-08: 12 mg via ORAL
  Filled 2021-10-08: qty 2

## 2021-10-08 MED ORDER — DARATUMUMAB-HYALURONIDASE-FIHJ 1800-30000 MG-UT/15ML ~~LOC~~ SOLN
1800.0000 mg | Freq: Once | SUBCUTANEOUS | Status: AC
  Administered 2021-10-08: 1800 mg via SUBCUTANEOUS
  Filled 2021-10-08: qty 15

## 2021-10-08 MED ORDER — ACETAMINOPHEN 325 MG PO TABS
650.0000 mg | ORAL_TABLET | Freq: Once | ORAL | Status: AC
  Administered 2021-10-08: 650 mg via ORAL
  Filled 2021-10-08: qty 2

## 2021-10-08 NOTE — Progress Notes (Signed)
Per Dr. Irene Limbo 50mns post treatment observation for Dose 3 (this time) and dose 4 (next week) and if no reaction can hold off on post administration monitoring.

## 2021-10-08 NOTE — Progress Notes (Signed)
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  HISTORY OF PRESENTING ILLNESS:  Please see previous note for details on initial presentation  INTERVAL HISTORY [name] is a 71 y.o. 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 She reports She is doing well with no new symptoms or concerns.  She notes some issues with the steroids.  She reports some grade 1 fatigue. She notes issues with sleeping.  She reports some constipation. We discussed taking Miralax 1x per day or as needed for constipation.  She is eating and drinking well.  No prohibitive toxicities from Daratumumab at this time.  No new medications.  No cough. No nausea or vomiting. No palpitations. No symptoms suggestive of neuropathy at this time. No mouth sores. No bloody or black stools. No skin rashes. No new leg pain or swelling. No chest pain or shortness of breath. No other new or acute focal symptoms.  Labs done today reviewed in detail. CBC show WBC count of 2.9k and ALC of 0.3k, ANC of 1.9 improved from 1.3, platelets are stable at 193k and hemoglobin was 8.4. Persistent normocytic anemia with RBC count of 2.56.  CMP stable. Creatinine elevated at 1.20 and GFR est. Low at 48 which is consistent with chronic kidney disease.  MEDICAL HISTORY:  Past Medical History:  Diagnosis Date   ASHD (arteriosclerotic heart disease)    Mild septal hypertrophy, 2D ECHO 11/2006, Dr. Percival Spanish   Breast cancer [facility])    PMH of, Dr. Marylene Buerger, Dr. Truddie Coco  multilpe sites overlaping, ER +   Facet syndrome, lumbar    Dr. Lynann Bologna   Heart murmur     no problems   History of COVID-19 05/07/2019   Hyperlipidemia    Hyperlipidemia    Hypertension    Osteopenia    Skin cancer, basal cell    Dr. Amy Martinique     SURGICAL HISTORY: Past Surgical History:  Procedure Laterality Date   CATARACT EXTRACTION Right 06/2020   CHOLECYSTECTOMY  about 1993   COLONOSCOPY      X 3 negative (last 2008, due 2015), Dr. Olevia Perches   MASTECTOMY Left 11/2004   Dr. Marylene Buerger, also reduction of right breast and reconstruction on the left.   ROTATOR CUFF REPAIR Left    TONSILLECTOMY     TRANSESOPHAGEAL ECHOCARDIOGRAM  2012   Overall left ventricular systolic function was normal. Left ventricular ejection fraction was estimated, range being 55% to 60%. There were no left ventricular regional wall motional abnormalities. Left ventricular wall thickness was mildly increased. There was mild focal basal septal hypertrophy.   WISDOM TOOTH EXTRACTION      SOCIAL HISTORY: Social History   Socioeconomic History   Marital status: Married    Spouse name: Not on file   Number of children: Not on file   Years of education: Not on file   Highest education level: Not on file  Occupational History   Occupation: Retired    Fish farm manager: UNEMPLOYED  Tobacco Use   Smoking status: Former    Packs/day: 1.00    Years: 10.00    Pack years: 10.00    Types: Cigarettes    Quit date: 05/06/1978  Years since quitting: 43.4   Smokeless tobacco: Never   Tobacco comments:    smoked [place], up to 1 ppd  Vaping Use   Vaping Use: Never used  Substance and Sexual Activity   Alcohol use: Yes    Alcohol/week: 4.0 standard drinks    Types: 4 Glasses of wine per week   Drug use: No   Sexual activity: Yes    Partners: Male    Birth control/protection: Post-menopausal  Other Topics Concern   Not on file  Social History Narrative   Married   Regular exercise: Yes, high level CVE, walks 2 mpd 5x/ week & gym   No specific diet   Social Determinants of Health    Financial Resource Strain: Not on file  Food Insecurity: Not on file  Transportation Needs: Not on file  Physical Activity: Not on file  Stress: Not on file  Social Connections: Not on file  Intimate Partner Violence: Not on file    FAMILY HISTORY: Family History  Problem Relation Age of Onset   Hypertension Mother    Coronary artery disease Mother        no MI   Lung cancer Mother        smoker   Colon cancer Father 83   Coronary artery disease Father        no MI   Prostate cancer Father    Diabetes Father    Hypertension Sister    Cancer Maternal Grandfather        GI   Diabetes Paternal Grandfather    Stroke Neg Hx    Esophageal cancer Neg Hx    Stomach cancer Neg Hx    Rectal cancer Neg Hx     ALLERGIES:  is allergic to shrimp [shellfish allergy], hydrocodone, and vicodin [hydrocodone-acetaminophen].  MEDICATIONS:  Current Outpatient Medications  Medication Sig Dispense Refill   acyclovir (ZOVIRAX) 400 MG tablet Take 1 tablet (400 mg total) by mouth 2 (two) times daily. 30 tablet 6   amLODipine (NORVASC) 10 MG tablet Take 1 tablet (10 mg total) by mouth daily. 30 tablet 0   benazepril (LOTENSIN) 40 MG tablet Take 1 tablet (40 mg total) by mouth daily. 30 tablet 0   cyclophosphamide (CYTOXAN) 50 MG capsule Take 10 capsules (500 mg total) by mouth once a week. Take with food to minimize GI upset. Take early in the day and maintain hydration. (Bring to cancer center and take with Velcade injection after nausea medications). 40 capsule 2   denosumab (PROLIA) 60 MG/ML SOLN injection Inject 60 mg into the skin every 6 (six) months. Administer in upper arm, thigh, or abdomen     iron polysaccharides (NIFEREX) 150 MG capsule Take 1 capsule (150 mg total) by mouth daily. 30 capsule 2   LORazepam (ATIVAN) 0.5 MG tablet Take 1 tablet (0.5 mg total) by mouth every 6 (six) hours as needed (Nausea or vomiting). 30 tablet 0   ondansetron (ZOFRAN) 8 MG tablet Take 8 mg by mouth  30 to 60 min prior to Cytoxan administration then take 8 mg twice daily as needed for nausea and vomiting. 30 tablet 1   prochlorperazine (COMPAZINE) 10 MG tablet Take 1 tablet (10 mg total) by mouth every 6 (six) hours as needed (Nausea or vomiting). 30 tablet 1   rosuvastatin (CRESTOR) 20 MG tablet 1 tablet     No current facility-administered medications for this visit.    REVIEW OF SYSTEMS:   10 Point review of Systems  was done is negative except as noted above.  PHYSICAL EXAMINATION :  .BP 132/72 (BP Location: Left Arm, Patient Position: Sitting)   Pulse 66   Temp 97.6 F (36.4 C) (Temporal)   Resp 18   Ht 5' 3"  (1.6 m)   Wt 119 lb 8 oz (54.2 kg)   LMP 05/07/1999 (LMP Unknown)   SpO2 98%   BMI 21.17 kg/m  Vitals:   10/08/21 1206  BP: 132/72  Pulse: 66  Resp: 18  Temp: 97.6 F (36.4 C)  SpO2: 98%   NAD GENERAL:alert, in no acute distress and comfortable SKIN: no acute rashes, no significant lesions EYES: conjunctiva are pink and non-injected, sclera anicteric NECK: supple, no JVD LYMPH:  no palpable lymphadenopathy in the cervical, axillary or inguinal regions LUNGS: clear to auscultation b/l with normal respiratory effort HEART: regular rate & rhythm ABDOMEN:  normoactive bowel sounds , non tender, not distended. Extremity: no pedal edema PSYCH: alert & oriented x 3 with fluent speech NEURO: no focal motor/sensory deficits  LABORATORY DATA:  I have reviewed the data as listed  .    Latest Ref Rng & Units 10/08/2021   11:30 AM 10/02/2021   12:03 PM 09/24/2021    9:47 AM  CBC  WBC 4.0 - 10.5 K/uL 2.9   2.1   5.6    Hemoglobin 12.0 - 15.0 g/dL 8.4   8.1   8.9    Hematocrit 36.0 - 46.0 % 24.6   23.2   25.5    Platelets 150 - 400 K/uL 193   195   213    .CBC    Component Value Date/Time   WBC 2.9 (L) 10/08/2021 1130   RBC 2.56 (L) 10/08/2021 1130   HGB 8.4 (L) 10/08/2021 1130   HGB 9.3 (L) 08/20/2021 1156   HCT 24.6 (L) 10/08/2021 1130   PLT 193  10/08/2021 1130   PLT 182 08/20/2021 1156   MCV 96.1 10/08/2021 1130   MCH 32.8 10/08/2021 1130   MCHC 34.1 10/08/2021 1130   RDW 16.6 (H) 10/08/2021 1130   LYMPHSABS 0.3 (L) 10/08/2021 1130   MONOABS 0.5 10/08/2021 1130   EOSABS 0.1 10/08/2021 1130   BASOSABS 0.0 10/08/2021 1130       Latest Ref Rng & Units 10/08/2021   11:30 AM 10/02/2021   12:03 PM 09/24/2021    9:47 AM  CMP  Glucose 70 - 99 mg/dL 121   101   136    BUN 8 - 23 mg/dL 21   21   26     Creatinine 0.44 - 1.00 mg/dL 1.20   1.10   1.18    Sodium 135 - 145 mmol/L 135   134   134    Potassium 3.5 - 5.1 mmol/L 3.9   4.0   4.5    Chloride 98 - 111 mmol/L 102   100   101    CO2 22 - 32 mmol/L 27   29   27     Calcium 8.9 - 10.3 mg/dL 9.2   9.0   9.2    Total Protein 6.5 - 8.1 g/dL 5.8   6.0   6.6    Total Bilirubin 0.3 - 1.2 mg/dL 0.4   0.4   0.5    Alkaline Phos 38 - 126 U/L 37   36   35    AST 15 - 41 U/L 22   22   23     ALT 0 - 44 U/L 17  17   20      Surgical Pathology  CASE: [id number]  PATIENT: Milika Sharpnack  Bone Marrow Report      Clinical History: Multiple myeloma , Bence-jones proteinuria  (BH)      DIAGNOSIS:   BONE MARROW, ASPIRATE, CLOT, CORE:  -Normocellular bone marrow with plasma cell neoplasm  -See comment   PERIPHERAL BLOOD:  -Normocytic-normochromic anemia   COMMENT:   The bone marrow is generally normocellular for age with trilineage  hematopoiesis.  In this background, the plasma cells are increased in  number representing 12% of cells associated with small clusters in the  clot and biopsy sections.  The plasma cells display lambda light chain  restriction consistent with plasma cell neoplasm.  Correlation with  cytogenetic and FISH studies is recommended.     RADIOGRAPHIC STUDIES: I have personally reviewed the radiological images as listed and agreed with the findings in the report. No results found.  ASSESSMENT & PLAN:   71 year old female with history of  hypertrophic cardiomyopathy, hypertension, dyslipidemia with  #1  Recently diagnosed smoldering myeloma with renal LCDD Bone marrow biopsy with 12% lambda restricted plasma cells PET CT scan with no hypermetabolic bone lesions Cytogenetics normal female karyotype Molecular cytogenetics atypical t (11;14) Component     Latest Ref Rng & Units 01/30/2021  IgG (Immunoglobin G), Serum     586 - 1,602 mg/dL 610  IgA     64 - 422 mg/dL 91  IgM (Immunoglobulin M), Srm     26 - 217 mg/dL 79  Total Protein ELP     6.0 - 8.5 g/dL 6.8  Albumin SerPl Elph-Mcnc     2.9 - 4.4 g/dL 4.3  Alpha 1     0.0 - 0.4 g/dL 0.2  Alpha2 Glob SerPl Elph-Mcnc     0.4 - 1.0 g/dL 0.7  B-Globulin SerPl Elph-Mcnc     0.7 - 1.3 g/dL 1.0  Gamma Glob SerPl Elph-Mcnc     0.4 - 1.8 g/dL 0.6  M Protein SerPl Elph-Mcnc     Not Observed g/dL Not Observed  Globulin, Total     2.2 - 3.9 g/dL 2.5  Albumin/Glob SerPl     0.7 - 1.7 1.8 (H)  IFE 1      Comment  Please Note (HCV):      Comment  Kappa free light chain     3.3 - 19.4 mg/L 11.8  Lambda free light chains     5.7 - 26.3 mg/L 480.8 (H)  Kappa, lambda light chain ratio     0.26 - 1.65 0.02 (L)   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  History of hypertension, dyslipidemia #5  Normocytic anemia with some worsening with hemoglobin down to 8.9 likely from Cytoxan.  Previous mild anemia likely related to chronic kidney disease and plasma cell dyscrasia.  Plan -Labs done today reviewed in detail. CBC show WBC count of 2.9k and ALC of 0.3k, ANC of 1.9 improved from 1.3, platelets are stable at 193k and hemoglobin was 8.4. Persistent normocytic anemia with RBC count of 2.56.  CMP stable. Creatinine elevated at 1.20 and GFR  est. Low at 48 which is consistent with chronic kidney disease. -No prohibitive toxicities from Daratumumab CyBORD at this time. -Other than some anemia thrombocytopenia patient has  no significant other toxicities from her CyBorD regimen at this time. -Patient appropriate to proceed with cycle 4-day 22 of CyBorD with weekly Velcade and dose reduced dexamethasone at 20 mg weekly. -We will continue the same supportive medications -Treatment orders were reviewed and signed and treatment was coordinated with her nurses in infusion. -She is following with nephrology at [facility] to take Imodium and replacing electrolytes with Gatorade or Pedialyte for any diarrhea spells. She is further advised to take an over-the-counter probiotic or eat probiotic yogurt. -Take Miralax 1x per day or as needed for constipation. -RTC in 3 weeks with labs  Follow-up Plz schedule next 2 cycles of DaraCyBorD (8 weekly treatments) with labs MD visit in 3 weeks  The total time spent in the appointment was 30 minutes*.  All of the patient's questions were answered with apparent satisfaction. The patient knows to call the clinic with any problems, questions or concerns.   Sullivan Lone MD MS AAHIVMS [facility] Hematology/Oncology Physician [facility]  .*Total Encounter Time as defined by the Centers for Medicare and Medicaid Services includes, in addition to the face-to-face time of a patient visit (documented in the note above) non-face-to-face time: obtaining and reviewing outside history, ordering and reviewing medications, tests or procedures, care coordination (communications with other health care professionals or caregivers) and documentation in the medical record.  I, Melene Muller, am acting as scribe for Dr. Sullivan Lone, MD. .I have reviewed the above documentation for accuracy and completeness, and I agree with the above. Brunetta Genera MD

## 2021-10-08 NOTE — Patient Instructions (Signed)
[facility] ONCOLOGY  Discharge Instructions: Thank you for choosing [facility] to provide your oncology and hematology care.   If you have a lab appointment with the [facility], please go directly to the [facility] and check in at the registration area.   Wear comfortable clothing and clothing appropriate for easy access to any Portacath or PICC line.   We strive to give you quality time with your provider. You may need to reschedule your appointment if you arrive late (15 or more minutes).  Arriving late affects you and other patients whose appointments are after yours.  Also, if you miss three or more appointments without notifying the office, you may be dismissed from the clinic at the provider's discretion.      For prescription refill requests, have your pharmacy contact our office and allow 72 hours for refills to be completed.    Today you received the following chemotherapy and/or immunotherapy agents: Bortezomib (Velcade), Daratumumab (Darrzalex Faspro)    To help prevent nausea and vomiting after your treatment, we encourage you to take your nausea medication as directed.  BELOW ARE SYMPTOMS THAT SHOULD BE REPORTED IMMEDIATELY: *FEVER GREATER THAN 100.4 F (38 C) OR HIGHER *CHILLS OR SWEATING *NAUSEA AND VOMITING THAT IS NOT CONTROLLED WITH YOUR NAUSEA MEDICATION *UNUSUAL SHORTNESS OF BREATH *UNUSUAL BRUISING OR BLEEDING *URINARY PROBLEMS (pain or burning when urinating, or frequent urination) *BOWEL PROBLEMS (unusual diarrhea, constipation, pain near the anus) TENDERNESS IN MOUTH AND THROAT WITH OR WITHOUT PRESENCE OF ULCERS (sore throat, sores in mouth, or a toothache) UNUSUAL RASH, SWELLING OR PAIN  UNUSUAL VAGINAL DISCHARGE OR ITCHING   Items with * indicate a potential emergency and should be followed up as soon as possible or go to the Emergency Department if any problems should occur.  Please show the CHEMOTHERAPY ALERT CARD or  IMMUNOTHERAPY ALERT CARD at check-in to the Emergency Department and triage nurse.  Should you have questions after your visit or need to cancel or reschedule your appointment, please contact [facility]  Dept: [phone]  and follow the prompts.  Office hours are 8:00 a.m. to 4:30 p.m. Monday - Friday. Please note that voicemails left after 4:00 p.m. may not be returned until the following business day.  We are closed weekends and major holidays. You have access to a nurse at all times for urgent questions. Please call the main number to the clinic Dept: [phone] and follow the prompts.   For any non-urgent questions, you may also contact your provider using MyChart. We now offer e-Visits for anyone 43 and older to request care online for non-urgent symptoms. For details visit [email or web address].   Also download the MyChart app! Go to the app store, search "MyChart", open the app, select [facility], and log in with your MyChart username and password.  Due to Covid, a mask is required upon entering the hospital/clinic. If you do not have a mask, one will be given to you upon arrival. For doctor visits, patients may have 1 support person aged 37 or older with them. For treatment visits, patients cannot have anyone with them due to current Covid guidelines and our immunocompromised population.

## 2021-10-09 ENCOUNTER — Telehealth: Payer: Self-pay | Admitting: Hematology

## 2021-10-09 NOTE — Telephone Encounter (Signed)
Left message with follow-up appointments per 6/5 los.

## 2021-10-14 ENCOUNTER — Encounter: Payer: Self-pay | Admitting: Hematology

## 2021-10-15 ENCOUNTER — Other Ambulatory Visit: Payer: Self-pay

## 2021-10-15 ENCOUNTER — Inpatient Hospital Stay: Payer: Medicare Other

## 2021-10-15 VITALS — BP 123/72 | HR 70 | Temp 97.8°F | Resp 18 | Ht 63.0 in | Wt 121.2 lb

## 2021-10-15 DIAGNOSIS — D8989 Other specified disorders involving the immune mechanism, not elsewhere classified: Secondary | ICD-10-CM

## 2021-10-15 DIAGNOSIS — I129 Hypertensive chronic kidney disease with stage 1 through stage 4 chronic kidney disease, or unspecified chronic kidney disease: Secondary | ICD-10-CM | POA: Diagnosis not present

## 2021-10-15 DIAGNOSIS — Z7189 Other specified counseling: Secondary | ICD-10-CM

## 2021-10-15 DIAGNOSIS — D631 Anemia in chronic kidney disease: Secondary | ICD-10-CM | POA: Diagnosis not present

## 2021-10-15 DIAGNOSIS — R803 Bence Jones proteinuria: Secondary | ICD-10-CM | POA: Diagnosis not present

## 2021-10-15 DIAGNOSIS — C9000 Multiple myeloma not having achieved remission: Secondary | ICD-10-CM | POA: Diagnosis not present

## 2021-10-15 DIAGNOSIS — N189 Chronic kidney disease, unspecified: Secondary | ICD-10-CM | POA: Diagnosis not present

## 2021-10-15 DIAGNOSIS — Z5112 Encounter for antineoplastic immunotherapy: Secondary | ICD-10-CM | POA: Diagnosis not present

## 2021-10-15 LAB — CBC WITH DIFFERENTIAL/PLATELET
Abs Immature Granulocytes: 0.03 10*3/uL (ref 0.00–0.07)
Basophils Absolute: 0.1 10*3/uL (ref 0.0–0.1)
Basophils Relative: 2 %
Eosinophils Absolute: 0.2 10*3/uL (ref 0.0–0.5)
Eosinophils Relative: 5 %
HCT: 24.7 % — ABNORMAL LOW (ref 36.0–46.0)
Hemoglobin: 8.4 g/dL — ABNORMAL LOW (ref 12.0–15.0)
Immature Granulocytes: 1 %
Lymphocytes Relative: 9 %
Lymphs Abs: 0.3 10*3/uL — ABNORMAL LOW (ref 0.7–4.0)
MCH: 33.2 pg (ref 26.0–34.0)
MCHC: 34 g/dL (ref 30.0–36.0)
MCV: 97.6 fL (ref 80.0–100.0)
Monocytes Absolute: 0.4 10*3/uL (ref 0.1–1.0)
Monocytes Relative: 12 %
Neutro Abs: 2.1 10*3/uL (ref 1.7–7.7)
Neutrophils Relative %: 71 %
Platelets: 184 10*3/uL (ref 150–400)
RBC: 2.53 MIL/uL — ABNORMAL LOW (ref 3.87–5.11)
RDW: 16.7 % — ABNORMAL HIGH (ref 11.5–15.5)
WBC: 3 10*3/uL — ABNORMAL LOW (ref 4.0–10.5)
nRBC: 0 % (ref 0.0–0.2)

## 2021-10-15 LAB — CMP (CANCER CENTER ONLY)
ALT: 23 U/L (ref 0–44)
AST: 30 U/L (ref 15–41)
Albumin: 3.9 g/dL (ref 3.5–5.0)
Alkaline Phosphatase: 33 U/L — ABNORMAL LOW (ref 38–126)
Anion gap: 4 — ABNORMAL LOW (ref 5–15)
BUN: 23 mg/dL (ref 8–23)
CO2: 29 mmol/L (ref 22–32)
Calcium: 9 mg/dL (ref 8.9–10.3)
Chloride: 104 mmol/L (ref 98–111)
Creatinine: 1.2 mg/dL — ABNORMAL HIGH (ref 0.44–1.00)
GFR, Estimated: 48 mL/min — ABNORMAL LOW (ref >60)
Glucose, Bld: 123 mg/dL — ABNORMAL HIGH (ref 70–99)
Potassium: 3.5 mmol/L (ref 3.5–5.1)
Sodium: 137 mmol/L (ref 135–145)
Total Bilirubin: 0.4 mg/dL (ref 0.3–1.2)
Total Protein: 5.9 g/dL — ABNORMAL LOW (ref 6.5–8.1)

## 2021-10-15 MED ORDER — FAMOTIDINE 20 MG PO TABS
20.0000 mg | ORAL_TABLET | Freq: Once | ORAL | Status: AC
  Administered 2021-10-15: 20 mg via ORAL
  Filled 2021-10-15: qty 1

## 2021-10-15 MED ORDER — MONTELUKAST SODIUM 10 MG PO TABS
10.0000 mg | ORAL_TABLET | Freq: Once | ORAL | Status: AC
  Administered 2021-10-15: 10 mg via ORAL
  Filled 2021-10-15: qty 1

## 2021-10-15 MED ORDER — DEXAMETHASONE 4 MG PO TABS
20.0000 mg | ORAL_TABLET | Freq: Once | ORAL | Status: AC
  Administered 2021-10-15: 20 mg via ORAL
  Filled 2021-10-15: qty 5

## 2021-10-15 MED ORDER — DARATUMUMAB-HYALURONIDASE-FIHJ 1800-30000 MG-UT/15ML ~~LOC~~ SOLN
1800.0000 mg | Freq: Once | SUBCUTANEOUS | Status: AC
  Administered 2021-10-15: 1800 mg via SUBCUTANEOUS
  Filled 2021-10-15: qty 15

## 2021-10-15 MED ORDER — DIPHENHYDRAMINE HCL 25 MG PO CAPS
50.0000 mg | ORAL_CAPSULE | Freq: Once | ORAL | Status: AC
  Administered 2021-10-15: 50 mg via ORAL
  Filled 2021-10-15: qty 2

## 2021-10-15 MED ORDER — ONDANSETRON HCL 8 MG PO TABS
12.0000 mg | ORAL_TABLET | Freq: Once | ORAL | Status: AC
  Administered 2021-10-15: 12 mg via ORAL
  Filled 2021-10-15: qty 2

## 2021-10-15 MED ORDER — BORTEZOMIB CHEMO SQ INJECTION 3.5 MG (2.5MG/ML)
1.5000 mg/m2 | Freq: Once | INTRAMUSCULAR | Status: AC
  Administered 2021-10-15: 2.25 mg via SUBCUTANEOUS
  Filled 2021-10-15: qty 0.9

## 2021-10-15 MED ORDER — ACETAMINOPHEN 325 MG PO TABS
650.0000 mg | ORAL_TABLET | Freq: Once | ORAL | Status: AC
  Administered 2021-10-15: 650 mg via ORAL
  Filled 2021-10-15: qty 2

## 2021-10-15 NOTE — Progress Notes (Signed)
Per Dr. Irene Limbo 41mns post treatment observation for Dose 4 Darzalex Faspro. Pt tolerated well and no longer need post administration monitoring.   PO Cytoxan from home given 30 min post anti-nausea medication.

## 2021-10-15 NOTE — Patient Instructions (Signed)
[facility] ONCOLOGY  Discharge Instructions: Thank you for choosing [facility] to provide your oncology and hematology care.   If you have a lab appointment with the [facility], please go directly to the [facility] and check in at the registration area.   Wear comfortable clothing and clothing appropriate for easy access to any Portacath or PICC line.   We strive to give you quality time with your provider. You may need to reschedule your appointment if you arrive late (15 or more minutes).  Arriving late affects you and other patients whose appointments are after yours.  Also, if you miss three or more appointments without notifying the office, you may be dismissed from the clinic at the provider's discretion.      For prescription refill requests, have your pharmacy contact our office and allow 72 hours for refills to be completed.    Today you received the following chemotherapy and/or immunotherapy agents darzalex faspro, cytoxan PO, velcade      To help prevent nausea and vomiting after your treatment, we encourage you to take your nausea medication as directed.  BELOW ARE SYMPTOMS THAT SHOULD BE REPORTED IMMEDIATELY: *FEVER GREATER THAN 100.4 F (38 C) OR HIGHER *CHILLS OR SWEATING *NAUSEA AND VOMITING THAT IS NOT CONTROLLED WITH YOUR NAUSEA MEDICATION *UNUSUAL SHORTNESS OF BREATH *UNUSUAL BRUISING OR BLEEDING *URINARY PROBLEMS (pain or burning when urinating, or frequent urination) *BOWEL PROBLEMS (unusual diarrhea, constipation, pain near the anus) TENDERNESS IN MOUTH AND THROAT WITH OR WITHOUT PRESENCE OF ULCERS (sore throat, sores in mouth, or a toothache) UNUSUAL RASH, SWELLING OR PAIN  UNUSUAL VAGINAL DISCHARGE OR ITCHING   Items with * indicate a potential emergency and should be followed up as soon as possible or go to the Emergency Department if any problems should occur.  Please show the CHEMOTHERAPY ALERT CARD or IMMUNOTHERAPY  ALERT CARD at check-in to the Emergency Department and triage nurse.  Should you have questions after your visit or need to cancel or reschedule your appointment, please contact [facility]  Dept: [phone]  and follow the prompts.  Office hours are 8:00 a.m. to 4:30 p.m. Monday - Friday. Please note that voicemails left after 4:00 p.m. may not be returned until the following business day.  We are closed weekends and major holidays. You have access to a nurse at all times for urgent questions. Please call the main number to the clinic Dept: [phone] and follow the prompts.   For any non-urgent questions, you may also contact your provider using MyChart. We now offer e-Visits for anyone 55 and older to request care online for non-urgent symptoms. For details visit [email or web address].   Also download the MyChart app! Go to the app store, search "MyChart", open the app, select [facility], and log in with your MyChart username and password.  Due to Covid, a mask is required upon entering the hospital/clinic. If you do not have a mask, one will be given to you upon arrival. For doctor visits, patients may have 1 support person aged 33 or older with them. For treatment visits, patients cannot have anyone with them due to current Covid guidelines and our immunocompromised population.

## 2021-10-16 ENCOUNTER — Other Ambulatory Visit (HOSPITAL_COMMUNITY): Payer: Self-pay

## 2021-10-22 ENCOUNTER — Other Ambulatory Visit: Payer: Self-pay

## 2021-10-22 ENCOUNTER — Inpatient Hospital Stay: Payer: Medicare Other

## 2021-10-22 VITALS — BP 108/70 | HR 66 | Temp 98.2°F | Resp 17 | Wt 117.5 lb

## 2021-10-22 DIAGNOSIS — I129 Hypertensive chronic kidney disease with stage 1 through stage 4 chronic kidney disease, or unspecified chronic kidney disease: Secondary | ICD-10-CM | POA: Diagnosis not present

## 2021-10-22 DIAGNOSIS — N189 Chronic kidney disease, unspecified: Secondary | ICD-10-CM | POA: Diagnosis not present

## 2021-10-22 DIAGNOSIS — D8989 Other specified disorders involving the immune mechanism, not elsewhere classified: Secondary | ICD-10-CM

## 2021-10-22 DIAGNOSIS — Z5112 Encounter for antineoplastic immunotherapy: Secondary | ICD-10-CM | POA: Diagnosis not present

## 2021-10-22 DIAGNOSIS — C9000 Multiple myeloma not having achieved remission: Secondary | ICD-10-CM | POA: Diagnosis not present

## 2021-10-22 DIAGNOSIS — R803 Bence Jones proteinuria: Secondary | ICD-10-CM | POA: Diagnosis not present

## 2021-10-22 DIAGNOSIS — D631 Anemia in chronic kidney disease: Secondary | ICD-10-CM | POA: Diagnosis not present

## 2021-10-22 DIAGNOSIS — Z7189 Other specified counseling: Secondary | ICD-10-CM

## 2021-10-22 LAB — CBC WITH DIFFERENTIAL/PLATELET
Abs Immature Granulocytes: 0.03 10*3/uL (ref 0.00–0.07)
Basophils Absolute: 0.1 10*3/uL (ref 0.0–0.1)
Basophils Relative: 2 %
Eosinophils Absolute: 0.2 10*3/uL (ref 0.0–0.5)
Eosinophils Relative: 7 %
HCT: 25.5 % — ABNORMAL LOW (ref 36.0–46.0)
Hemoglobin: 8.7 g/dL — ABNORMAL LOW (ref 12.0–15.0)
Immature Granulocytes: 1 %
Lymphocytes Relative: 10 %
Lymphs Abs: 0.3 10*3/uL — ABNORMAL LOW (ref 0.7–4.0)
MCH: 33.3 pg (ref 26.0–34.0)
MCHC: 34.1 g/dL (ref 30.0–36.0)
MCV: 97.7 fL (ref 80.0–100.0)
Monocytes Absolute: 0.3 10*3/uL (ref 0.1–1.0)
Monocytes Relative: 13 %
Neutro Abs: 1.7 10*3/uL (ref 1.7–7.7)
Neutrophils Relative %: 67 %
Platelets: 198 10*3/uL (ref 150–400)
RBC: 2.61 MIL/uL — ABNORMAL LOW (ref 3.87–5.11)
RDW: 16.1 % — ABNORMAL HIGH (ref 11.5–15.5)
WBC: 2.6 10*3/uL — ABNORMAL LOW (ref 4.0–10.5)
nRBC: 0 % (ref 0.0–0.2)

## 2021-10-22 LAB — CMP (CANCER CENTER ONLY)
ALT: 27 U/L (ref 0–44)
AST: 33 U/L (ref 15–41)
Albumin: 4 g/dL (ref 3.5–5.0)
Alkaline Phosphatase: 39 U/L (ref 38–126)
Anion gap: 7 (ref 5–15)
BUN: 20 mg/dL (ref 8–23)
CO2: 25 mmol/L (ref 22–32)
Calcium: 8.9 mg/dL (ref 8.9–10.3)
Chloride: 104 mmol/L (ref 98–111)
Creatinine: 1.21 mg/dL — ABNORMAL HIGH (ref 0.44–1.00)
GFR, Estimated: 48 mL/min — ABNORMAL LOW (ref >60)
Glucose, Bld: 98 mg/dL (ref 70–99)
Potassium: 3.4 mmol/L — ABNORMAL LOW (ref 3.5–5.1)
Sodium: 136 mmol/L (ref 135–145)
Total Bilirubin: 0.4 mg/dL (ref 0.3–1.2)
Total Protein: 6.2 g/dL — ABNORMAL LOW (ref 6.5–8.1)

## 2021-10-22 MED ORDER — BORTEZOMIB CHEMO SQ INJECTION 3.5 MG (2.5MG/ML)
1.5000 mg/m2 | Freq: Once | INTRAMUSCULAR | Status: AC
  Administered 2021-10-22: 2.25 mg via SUBCUTANEOUS
  Filled 2021-10-22: qty 0.9

## 2021-10-22 MED ORDER — DEXAMETHASONE 4 MG PO TABS
20.0000 mg | ORAL_TABLET | Freq: Once | ORAL | Status: AC
  Administered 2021-10-22: 20 mg via ORAL
  Filled 2021-10-22: qty 5

## 2021-10-22 MED ORDER — MONTELUKAST SODIUM 10 MG PO TABS
10.0000 mg | ORAL_TABLET | Freq: Once | ORAL | Status: AC
  Administered 2021-10-22: 10 mg via ORAL
  Filled 2021-10-22: qty 1

## 2021-10-22 MED ORDER — DIPHENHYDRAMINE HCL 25 MG PO CAPS
50.0000 mg | ORAL_CAPSULE | Freq: Once | ORAL | Status: AC
  Administered 2021-10-22: 50 mg via ORAL
  Filled 2021-10-22: qty 2

## 2021-10-22 MED ORDER — DARATUMUMAB-HYALURONIDASE-FIHJ 1800-30000 MG-UT/15ML ~~LOC~~ SOLN
1800.0000 mg | Freq: Once | SUBCUTANEOUS | Status: AC
  Administered 2021-10-22: 1800 mg via SUBCUTANEOUS
  Filled 2021-10-22: qty 15

## 2021-10-22 MED ORDER — ACETAMINOPHEN 325 MG PO TABS
650.0000 mg | ORAL_TABLET | Freq: Once | ORAL | Status: AC
  Administered 2021-10-22: 650 mg via ORAL
  Filled 2021-10-22: qty 2

## 2021-10-22 MED ORDER — ONDANSETRON HCL 8 MG PO TABS
8.0000 mg | ORAL_TABLET | Freq: Once | ORAL | Status: AC
  Administered 2021-10-22: 8 mg via ORAL
  Filled 2021-10-22: qty 1

## 2021-10-22 MED ORDER — FAMOTIDINE 20 MG PO TABS
20.0000 mg | ORAL_TABLET | Freq: Once | ORAL | Status: AC
  Administered 2021-10-22: 20 mg via ORAL
  Filled 2021-10-22: qty 1

## 2021-10-22 NOTE — Patient Instructions (Signed)
Daratumumab; Hyaluronidase Injection What is this medication? DARATUMUMAB; HYALURONIDASE (dar a toom ue mab / hye al ur ON i dase) is a monoclonal antibody. Hyaluronidase is used to improve the effects of daratumumab. It treats certain types of cancer. Some of the cancers treated are multiple myeloma and light-chain amyloidosis. This medicine may be used for other purposes; ask your health care provider or pharmacist if you have questions. COMMON BRAND NAME(S): DARZALEX FASPRO What should I tell my care team before I take this medication? They need to know if you have any of these conditions: heart disease infection especially a viral infection such as chickenpox, cold sores, herpes, or hepatitis B lung or breathing disease an unusual or allergic reaction to daratumumab, hyaluronidase, other medicines, foods, dyes, or preservatives pregnant or trying to get pregnant breast-feeding How should I use this medication? This medicine is for injection under the skin. It is given by a health care professional in a hospital or clinic setting. Talk to your pediatrician regarding the use of this medicine in children. Special care may be needed. Overdosage: If you think you have taken too much of this medicine contact a poison control center or emergency room at once. NOTE: This medicine is only for you. Do not share this medicine with others. What if I miss a dose? Keep appointments for follow-up doses as directed. It is important not to miss your dose. Call your doctor or health care professional if you are unable to keep an appointment. What may interact with this medication? Interactions have not been studied. This list may not describe all possible interactions. Give your health care provider a list of all the medicines, herbs, non-prescription drugs, or dietary supplements you use. Also tell them if you smoke, drink alcohol, or use illegal drugs. Some items may interact with your medicine. What  should I watch for while using this medication? Your condition will be monitored carefully while you are receiving this medicine. This medicine can cause serious allergic reactions. To reduce your risk, your health care provider may give you other medicine to take before receiving this one. Be sure to follow the directions from your health care provider. This medicine can affect the results of blood tests to match your blood type. These changes can last for up to 6 months after the final dose. Your healthcare provider will do blood tests to match your blood type before you start treatment. Tell all of your healthcare providers that you are being treated with this medicine before receiving a blood transfusion. This medicine can affect the results of some tests used to determine treatment response; extra tests may be needed to evaluate response. Do not become pregnant while taking this medicine or for 3 months after stopping it. Women should inform their health care provider if they wish to become pregnant or think they might be pregnant. There is a potential for serious side effects to an unborn child. Talk to your health care provider for more information. Do not breast-feed an infant while taking this medicine. What side effects may I notice from receiving this medication? Side effects that you should report to your care team as soon as possible: Allergic reactions--skin rash, itching or hives, swelling of the face, lips, or tongue Blood clot--chest pain, shortness of breath, pain, swelling or warmth in the leg Blurred vision Fast, irregular heartbeat Infection--fever, chills, cough, sore throat, pain or trouble passing urine Injection reactions--dizziness, fast heartbeat, feeling faint or lightheaded, falls, headache, increase in blood pressure, nausea,  vomiting, or wheezing or trouble breathing with loud or whistling sounds Low red blood cell counts--trouble breathing, feeling faint, lightheaded or  falls, unusually weak or tired Unusual bleeding or bruising Side effects that usually do not require medical attention (report these to your care team if they continue or are bothersome): Back pain Constipation Diarrhea Pain, tingling, numbness in the hands or feet Pain, redness, or irritation at site where injected Muscle cramp or pain Swelling of the ankles, feet, hands Tiredness Trouble sleeping This list may not describe all possible side effects. Call your doctor for medical advice about side effects. You may report side effects to FDA at 1-800-FDA-1088. Where should I keep my medication? This drug is given in a hospital or clinic and will not be stored at home. NOTE: This sheet is a summary. It may not cover all possible information. If you have questions about this medicine, talk to your doctor, pharmacist, or health care provider.  2023 Elsevier/Gold Standard (2021-03-23 00:00:00)    Bortezomib injection What is this medication? BORTEZOMIB (bor TEZ oh mib) targets proteins in cancer cells and stops the cancer cells from growing. It treats multiple myeloma and mantle cell lymphoma. This medicine may be used for other purposes; ask your health care provider or pharmacist if you have questions. COMMON BRAND NAME(S): Velcade What should I tell my care team before I take this medication? They need to know if you have any of these conditions: dehydration diabetes (high blood sugar) heart disease liver disease tingling of the fingers or toes or other nerve disorder an unusual or allergic reaction to bortezomib, mannitol, boron, other medicines, foods, dyes, or preservatives pregnant or trying to get pregnant breast-feeding How should I use this medication? This medicine is injected into a vein or under the skin. It is given by a health care provider in a hospital or clinic setting. Talk to your health care provider about the use of this medicine in children. Special care may be  needed. Overdosage: If you think you have taken too much of this medicine contact a poison control center or emergency room at once. NOTE: This medicine is only for you. Do not share this medicine with others. What if I miss a dose? Keep appointments for follow-up doses. It is important not to miss your dose. Call your health care provider if you are unable to keep an appointment. What may interact with this medication? This medicine may interact with the following medications: ketoconazole rifampin This list may not describe all possible interactions. Give your health care provider a list of all the medicines, herbs, non-prescription drugs, or dietary supplements you use. Also tell them if you smoke, drink alcohol, or use illegal drugs. Some items may interact with your medicine. What should I watch for while using this medication? Your condition will be monitored carefully while you are receiving this medicine. You may need blood work done while you are taking this medicine. You may get drowsy or dizzy. Do not drive, use machinery, or do anything that needs mental alertness until you know how this medicine affects you. Do not stand up or sit up quickly, especially if you are an older patient. This reduces the risk of dizzy or fainting spells This medicine may increase your risk of getting an infection. Call your health care provider for advice if you get a fever, chills, sore throat, or other symptoms of a cold or flu. Do not treat yourself. Try to avoid being around people who are sick.  Check with your health care provider if you have severe diarrhea, nausea, and vomiting, or if you sweat a lot. The loss of too much body fluid may make it dangerous for you to take this medicine. Do not become pregnant while taking this medicine or for 7 months after stopping it. Women should inform their health care provider if they wish to become pregnant or think they might be pregnant. Men should not father a  child while taking this medicine and for 4 months after stopping it. There is a potential for serious harm to an unborn child. Talk to your health care provider for more information. Do not breast-feed an infant while taking this medicine or for 2 months after stopping it. This medicine may make it more difficult to get pregnant or father a child. Talk to your health care provider if you are concerned about your fertility. What side effects may I notice from receiving this medication? Side effects that you should report to your doctor or health care professional as soon as possible: allergic reactions (skin rash; itching or hives; swelling of the face, lips, or tongue) bleeding (bloody or black, tarry stools; red or dark brown urine; spitting up blood or brown material that looks like coffee grounds; red spots on the skin; unusual bruising or bleeding from the eye, gums, or nose) blurred vision or changes in vision confusion constipation headache heart failure (trouble breathing; fast, irregular heartbeat; sudden weight gain; swelling of the ankles, feet, hands) infection (fever, chills, cough, sore throat, pain or trouble passing urine) lack or loss of appetite liver injury (dark yellow or brown urine; general ill feeling or flu-like symptoms; loss of appetite, right upper belly pain; yellowing of the eyes or skin) low blood pressure (dizziness; feeling faint or lightheaded, falls; unusually weak or tired) muscle cramps pain, redness, or irritation at site where injected pain, tingling, numbness in the hands or feet seizures trouble breathing unusual bruising or bleeding Side effects that usually do not require medical attention (report to your doctor or health care professional if they continue or are bothersome): diarrhea nausea stomach pain trouble sleeping vomiting This list may not describe all possible side effects. Call your doctor for medical advice about side effects. You may  report side effects to FDA at 1-800-FDA-1088. Where should I keep my medication? This medicine is given in a hospital or clinic. It will not be stored at home. NOTE: This sheet is a summary. It may not cover all possible information. If you have questions about this medicine, talk to your doctor, pharmacist, or health care provider.  2023 Elsevier/Gold Standard (2020-04-13 00:00:00)

## 2021-10-22 NOTE — Progress Notes (Signed)
Patient took 10 capsules of 50mg Cyclophosphamide following 30 minute nausea medication.      

## 2021-10-23 LAB — KAPPA/LAMBDA LIGHT CHAINS
Kappa free light chain: 9.9 mg/L (ref 3.3–19.4)
Kappa, lambda light chain ratio: 0.77 (ref 0.26–1.65)
Lambda free light chains: 12.8 mg/L (ref 5.7–26.3)

## 2021-10-29 ENCOUNTER — Other Ambulatory Visit: Payer: Medicare Other

## 2021-10-29 ENCOUNTER — Inpatient Hospital Stay: Payer: Medicare Other

## 2021-10-29 ENCOUNTER — Ambulatory Visit: Payer: Medicare Other

## 2021-10-29 ENCOUNTER — Inpatient Hospital Stay (HOSPITAL_BASED_OUTPATIENT_CLINIC_OR_DEPARTMENT_OTHER): Payer: Medicare Other | Admitting: Hematology

## 2021-10-29 ENCOUNTER — Other Ambulatory Visit: Payer: Self-pay

## 2021-10-29 VITALS — BP 109/66 | HR 70 | Temp 98.1°F | Resp 18 | Wt 117.5 lb

## 2021-10-29 DIAGNOSIS — D8989 Other specified disorders involving the immune mechanism, not elsewhere classified: Secondary | ICD-10-CM

## 2021-10-29 DIAGNOSIS — Z5112 Encounter for antineoplastic immunotherapy: Secondary | ICD-10-CM | POA: Diagnosis not present

## 2021-10-29 DIAGNOSIS — Z5111 Encounter for antineoplastic chemotherapy: Secondary | ICD-10-CM | POA: Diagnosis not present

## 2021-10-29 DIAGNOSIS — N189 Chronic kidney disease, unspecified: Secondary | ICD-10-CM | POA: Diagnosis not present

## 2021-10-29 DIAGNOSIS — C9000 Multiple myeloma not having achieved remission: Secondary | ICD-10-CM

## 2021-10-29 DIAGNOSIS — Z7189 Other specified counseling: Secondary | ICD-10-CM

## 2021-10-29 DIAGNOSIS — R803 Bence Jones proteinuria: Secondary | ICD-10-CM | POA: Diagnosis not present

## 2021-10-29 DIAGNOSIS — D631 Anemia in chronic kidney disease: Secondary | ICD-10-CM | POA: Diagnosis not present

## 2021-10-29 DIAGNOSIS — I129 Hypertensive chronic kidney disease with stage 1 through stage 4 chronic kidney disease, or unspecified chronic kidney disease: Secondary | ICD-10-CM | POA: Diagnosis not present

## 2021-10-29 LAB — CBC WITH DIFFERENTIAL/PLATELET
Abs Immature Granulocytes: 0.04 10*3/uL (ref 0.00–0.07)
Basophils Absolute: 0 10*3/uL (ref 0.0–0.1)
Basophils Relative: 2 %
Eosinophils Absolute: 0.2 10*3/uL (ref 0.0–0.5)
Eosinophils Relative: 7 %
HCT: 23.7 % — ABNORMAL LOW (ref 36.0–46.0)
Hemoglobin: 8.1 g/dL — ABNORMAL LOW (ref 12.0–15.0)
Immature Granulocytes: 2 %
Lymphocytes Relative: 11 %
Lymphs Abs: 0.3 10*3/uL — ABNORMAL LOW (ref 0.7–4.0)
MCH: 33.5 pg (ref 26.0–34.0)
MCHC: 34.2 g/dL (ref 30.0–36.0)
MCV: 97.9 fL (ref 80.0–100.0)
Monocytes Absolute: 0.4 10*3/uL (ref 0.1–1.0)
Monocytes Relative: 14 %
Neutro Abs: 1.7 10*3/uL (ref 1.7–7.7)
Neutrophils Relative %: 64 %
Platelets: 176 10*3/uL (ref 150–400)
RBC: 2.42 MIL/uL — ABNORMAL LOW (ref 3.87–5.11)
RDW: 15.5 % (ref 11.5–15.5)
WBC: 2.7 10*3/uL — ABNORMAL LOW (ref 4.0–10.5)
nRBC: 0 % (ref 0.0–0.2)

## 2021-10-29 LAB — CMP (CANCER CENTER ONLY)
ALT: 26 U/L (ref 0–44)
AST: 31 U/L (ref 15–41)
Albumin: 3.9 g/dL (ref 3.5–5.0)
Alkaline Phosphatase: 38 U/L (ref 38–126)
Anion gap: 6 (ref 5–15)
BUN: 27 mg/dL — ABNORMAL HIGH (ref 8–23)
CO2: 27 mmol/L (ref 22–32)
Calcium: 9.1 mg/dL (ref 8.9–10.3)
Chloride: 104 mmol/L (ref 98–111)
Creatinine: 1.25 mg/dL — ABNORMAL HIGH (ref 0.44–1.00)
GFR, Estimated: 46 mL/min — ABNORMAL LOW (ref >60)
Glucose, Bld: 108 mg/dL — ABNORMAL HIGH (ref 70–99)
Potassium: 3.3 mmol/L — ABNORMAL LOW (ref 3.5–5.1)
Sodium: 137 mmol/L (ref 135–145)
Total Bilirubin: 0.4 mg/dL (ref 0.3–1.2)
Total Protein: 5.9 g/dL — ABNORMAL LOW (ref 6.5–8.1)

## 2021-10-29 MED ORDER — DEXAMETHASONE 4 MG PO TABS
20.0000 mg | ORAL_TABLET | Freq: Once | ORAL | Status: AC
  Administered 2021-10-29: 20 mg via ORAL
  Filled 2021-10-29: qty 5

## 2021-10-29 MED ORDER — FAMOTIDINE 20 MG PO TABS
20.0000 mg | ORAL_TABLET | Freq: Once | ORAL | Status: AC
  Administered 2021-10-29: 20 mg via ORAL
  Filled 2021-10-29: qty 1

## 2021-10-29 MED ORDER — BORTEZOMIB CHEMO SQ INJECTION 3.5 MG (2.5MG/ML)
1.5000 mg/m2 | Freq: Once | INTRAMUSCULAR | Status: AC
  Administered 2021-10-29: 2.25 mg via SUBCUTANEOUS
  Filled 2021-10-29: qty 0.9

## 2021-10-29 MED ORDER — DARATUMUMAB-HYALURONIDASE-FIHJ 1800-30000 MG-UT/15ML ~~LOC~~ SOLN
1800.0000 mg | Freq: Once | SUBCUTANEOUS | Status: AC
  Administered 2021-10-29: 1800 mg via SUBCUTANEOUS
  Filled 2021-10-29: qty 15

## 2021-10-29 MED ORDER — DIPHENHYDRAMINE HCL 25 MG PO CAPS
50.0000 mg | ORAL_CAPSULE | Freq: Once | ORAL | Status: AC
  Administered 2021-10-29: 50 mg via ORAL
  Filled 2021-10-29: qty 2

## 2021-10-29 MED ORDER — ACETAMINOPHEN 325 MG PO TABS
650.0000 mg | ORAL_TABLET | Freq: Once | ORAL | Status: AC
  Administered 2021-10-29: 650 mg via ORAL
  Filled 2021-10-29: qty 2

## 2021-10-29 MED ORDER — MONTELUKAST SODIUM 10 MG PO TABS
10.0000 mg | ORAL_TABLET | Freq: Once | ORAL | Status: AC
  Administered 2021-10-29: 10 mg via ORAL
  Filled 2021-10-29: qty 1

## 2021-10-29 MED ORDER — ONDANSETRON HCL 8 MG PO TABS
8.0000 mg | ORAL_TABLET | Freq: Once | ORAL | Status: AC
  Administered 2021-10-29: 8 mg via ORAL
  Filled 2021-10-29: qty 1

## 2021-10-29 NOTE — Progress Notes (Signed)
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  HISTORY OF PRESENTING ILLNESS:  Please see previous note for details on initial presentation  INTERVAL HISTORY [name] is a 71 y.o. 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 She reports She is doing well with no new symptoms or concerns.  She notes that her previous diarrhea has resolved.  She reports that the steroids are still a source of discomfort.  She reports persistent grade 1 fatigue.   We discussed potential approaches to treatment if her free light chains remain stable in the next 1-2 more cycles of treatment.  She is eating and drinking well.  No prohibitive toxicities from Daratumumab at this time.  No new medications.  No cough. No nausea or vomiting. No palpitations. No symptoms suggestive of neuropathy at this time. No mouth sores. No bloody or black stools. No skin rashes. No new leg pain or swelling. No chest pain or shortness of breath. No other new or acute focal symptoms.  Labs done today reviewed in detail.  MEDICAL HISTORY:  Past Medical History:  Diagnosis Date   ASHD (arteriosclerotic heart disease)    Mild septal hypertrophy, 2D ECHO 11/2006, Dr. Percival Spanish   Breast cancer [facility])    PMH of, Dr. Marylene Buerger, Dr. Truddie Coco  multilpe sites overlaping, ER +   Facet syndrome, lumbar    Dr. Lynann Bologna   Heart murmur    no problems   History of COVID-19 05/07/2019   Hyperlipidemia    Hyperlipidemia    Hypertension    Osteopenia    Skin cancer, basal cell    Dr. Amy Martinique     SURGICAL HISTORY: Past Surgical History:   Procedure Laterality Date   CATARACT EXTRACTION Right 06/2020   CHOLECYSTECTOMY  about 1993   COLONOSCOPY      X 3 negative (last 2008, due 2015), Dr. Olevia Perches   MASTECTOMY Left 11/2004   Dr. Marylene Buerger, also reduction of right breast and reconstruction on the left.   ROTATOR CUFF REPAIR Left    TONSILLECTOMY     TRANSESOPHAGEAL ECHOCARDIOGRAM  2012   Overall left ventricular systolic function was normal. Left ventricular ejection fraction was estimated, range being 55% to 60%. There were no left ventricular regional wall motional abnormalities. Left ventricular wall thickness was mildly increased. There was mild focal basal septal hypertrophy.   WISDOM TOOTH EXTRACTION      SOCIAL HISTORY: Social History   Socioeconomic History   Marital status: Married    Spouse name: Not on file   Number of children: Not on file   Years of education: Not on file   Highest education level: Not on file  Occupational History   Occupation: Retired    Fish farm manager: UNEMPLOYED  Tobacco Use   Smoking status: Former    Packs/day: 1.00    Years: 10.00    Total pack years: 10.00    Types: Cigarettes    Quit date: 05/06/1978    Years since quitting: 43.5   Smokeless tobacco: Never   Tobacco comments:    smoked [place], up to 1 ppd  Vaping Use   Vaping Use: Never used  Substance and  Sexual Activity   Alcohol use: Yes    Alcohol/week: 4.0 standard drinks of alcohol    Types: 4 Glasses of wine per week   Drug use: No   Sexual activity: Yes    Partners: Male    Birth control/protection: Post-menopausal  Other Topics Concern   Not on file  Social History Narrative   Married   Regular exercise: Yes, high level CVE, walks 2 mpd 5x/ week & gym   No specific diet   Social Determinants of Health   Financial Resource Strain: Not on file  Food Insecurity: Not on file  Transportation Needs: Not on file  Physical Activity: Not on file  Stress: Not on file  Social Connections: Not on file   Intimate Partner Violence: Not on file    FAMILY HISTORY: Family History  Problem Relation Age of Onset   Hypertension Mother    Coronary artery disease Mother        no MI   Lung cancer Mother        smoker   Colon cancer Father 57   Coronary artery disease Father        no MI   Prostate cancer Father    Diabetes Father    Hypertension Sister    Cancer Maternal Grandfather        GI   Diabetes Paternal Grandfather    Stroke Neg Hx    Esophageal cancer Neg Hx    Stomach cancer Neg Hx    Rectal cancer Neg Hx     ALLERGIES:  is allergic to shrimp [shellfish allergy], hydrocodone, and vicodin [hydrocodone-acetaminophen].  MEDICATIONS:  Current Outpatient Medications  Medication Sig Dispense Refill   acyclovir (ZOVIRAX) 400 MG tablet Take 1 tablet (400 mg total) by mouth 2 (two) times daily. 30 tablet 6   amLODipine (NORVASC) 10 MG tablet Take 1 tablet (10 mg total) by mouth daily. 30 tablet 0   benazepril (LOTENSIN) 40 MG tablet Take 1 tablet (40 mg total) by mouth daily. 30 tablet 0   cyclophosphamide (CYTOXAN) 50 MG capsule Take 10 capsules (500 mg total) by mouth once a week. Take with food to minimize GI upset. Take early in the day and maintain hydration. (Bring to cancer center and take with Velcade injection after nausea medications). 40 capsule 2   denosumab (PROLIA) 60 MG/ML SOLN injection Inject 60 mg into the skin every 6 (six) months. Administer in upper arm, thigh, or abdomen     iron polysaccharides (NIFEREX) 150 MG capsule Take 1 capsule (150 mg total) by mouth daily. 30 capsule 2   LORazepam (ATIVAN) 0.5 MG tablet Take 1 tablet (0.5 mg total) by mouth every 6 (six) hours as needed (Nausea or vomiting). 30 tablet 0   ondansetron (ZOFRAN) 8 MG tablet Take 8 mg by mouth 30 to 60 min prior to Cytoxan administration then take 8 mg twice daily as needed for nausea and vomiting. 30 tablet 1   prochlorperazine (COMPAZINE) 10 MG tablet Take 1 tablet (10 mg total) by mouth  every 6 (six) hours as needed (Nausea or vomiting). 30 tablet 1   rosuvastatin (CRESTOR) 20 MG tablet 1 tablet     No current facility-administered medications for this visit.    REVIEW OF SYSTEMS:   10 Point review of Systems was done is negative except as noted above.  PHYSICAL EXAMINATION :  Vitals:   10/29/21 1223  BP: 109/66  Pulse: 70  Resp: 18  Temp: 98.1 F (36.7 C)  SpO2: 98%   NAD\ GENERAL:alert, in no acute distress and comfortable SKIN: no acute rashes, no significant lesions EYES: conjunctiva are pink and non-injected, sclera anicteric NECK: supple, no JVD LYMPH:  no palpable lymphadenopathy in the cervical, axillary or inguinal regions LUNGS: clear to auscultation b/l with normal respiratory effort HEART: regular rate & rhythm ABDOMEN:  normoactive bowel sounds , non tender, not distended. Extremity: no pedal edema PSYCH: alert & oriented x 3 with fluent speech NEURO: no focal motor/sensory deficits  LABORATORY DATA:  I have reviewed the data as listed  .    Latest Ref Rng & Units 10/29/2021   12:08 PM 10/22/2021   11:40 AM 10/15/2021   12:01 PM  CBC  WBC 4.0 - 10.5 K/uL 2.7  2.6  3.0   Hemoglobin 12.0 - 15.0 g/dL 8.1  8.7  8.4   Hematocrit 36.0 - 46.0 % 23.7  25.5  24.7   Platelets 150 - 400 K/uL 176  198  184   .CBC    Component Value Date/Time   WBC 2.7 (L) 10/29/2021 1208   RBC 2.42 (L) 10/29/2021 1208   HGB 8.1 (L) 10/29/2021 1208   HGB 9.3 (L) 08/20/2021 1156   HCT 23.7 (L) 10/29/2021 1208   PLT 176 10/29/2021 1208   PLT 182 08/20/2021 1156   MCV 97.9 10/29/2021 1208   MCH 33.5 10/29/2021 1208   MCHC 34.2 10/29/2021 1208   RDW 15.5 10/29/2021 1208   LYMPHSABS 0.3 (L) 10/29/2021 1208   MONOABS 0.4 10/29/2021 1208   EOSABS 0.2 10/29/2021 1208   BASOSABS 0.0 10/29/2021 1208       Latest Ref Rng & Units 10/29/2021   12:08 PM 10/22/2021   11:40 AM 10/15/2021   12:01 PM  CMP  Glucose 70 - 99 mg/dL 108  98  123   BUN 8 - 23 mg/dL _0 Creatinine 0.44 - 1.00 mg/dL 1.25  1.21  1.20   Sodium 135 - 145 mmol/L 137  136  137   Potassium 3.5 - 5.1 mmol/L 3.3  3.4  3.5   Chloride 98 - 111 mmol/L 104  104  104   CO2 22 - 32 mmol/L _1 Calcium 8.9 - 10.3 mg/dL 9.1  8.9  9.0   Total Protein 6.5 - 8.1 g/dL 5.9  6.2  5.9   Total Bilirubin 0.3 - 1.2 mg/dL 0.4  0.4  0.4   Alkaline Phos 38 - 126 U/L 38  39  33   AST 15 - 41 U/L 31  33  30   ALT 0 - 44 U/L _2 Surgical Pathology  CASE: [id number]  PATIENT: Licet Taddei  Bone Marrow Report    Clinical History: Multiple myeloma , Bence-jones proteinuria  (BH)   DIAGNOSIS:   BONE MARROW, ASPIRATE, CLOT, CORE:  -Normocellular bone marrow with plasma cell neoplasm  -See comment   PERIPHERAL BLOOD:  -Normocytic-normochromic anemia   COMMENT:   The bone marrow is generally normocellular for age with trilineage  hematopoiesis.  In this background, the plasma cells are increased in  number representing 12% of cells associated with small clusters in the  clot and biopsy sections.  The plasma cells display lambda light chain  restriction consistent with plasma cell neoplasm.  Correlation with  cytogenetic and FISH studies is recommended.     RADIOGRAPHIC STUDIES: I have personally reviewed the radiological  images as listed and agreed with the findings in the report. No results found.  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 Bone marrow biopsy with 12% lambda restricted plasma cells PET CT scan with no hypermetabolic bone lesions Cytogenetics normal female karyotype Molecular cytogenetics atypical t (11;14) Component     Latest Ref Rng & Units 01/30/2021  IgG (Immunoglobin G), Serum     586 - 1,602 mg/dL 610  IgA     64 - 422 mg/dL 91  IgM (Immunoglobulin M), Srm     26 - 217 mg/dL 79  Total Protein  ELP     6.0 - 8.5 g/dL 6.8  Albumin SerPl Elph-Mcnc     2.9 - 4.4 g/dL 4.3  Alpha 1     0.0 - 0.4 g/dL 0.2  Alpha2 Glob SerPl Elph-Mcnc     0.4 - 1.0 g/dL 0.7  B-Globulin SerPl Elph-Mcnc     0.7 - 1.3 g/dL 1.0  Gamma Glob SerPl Elph-Mcnc     0.4 - 1.8 g/dL 0.6  M Protein SerPl Elph-Mcnc     Not Observed g/dL Not Observed  Globulin, Total     2.2 - 3.9 g/dL 2.5  Albumin/Glob SerPl     0.7 - 1.7 1.8 (H)  IFE 1      Comment  Please Note (HCV):      Comment  Kappa free light chain     3.3 - 19.4 mg/L 11.8  Lambda free light chains     5.7 - 26.3 mg/L 480.8 (H)  Kappa, lambda light chain ratio     0.26 - 1.65 0.02 (L)   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  History of hypertension, dyslipidemia #5  Normocytic anemia with some worsening with hemoglobin down to 8.9 likely from Cytoxan.  Previous mild anemia likely related to chronic kidney disease and plasma cell dyscrasia.  Plan -Labs done today reviewed in detail. CBC show WBC count of 2.7k, platelets are stable at 176k and hemoglobin was 8.1. CMP stable. Creatinine elevated at 1.25 and GFR est. at 46 which is consistent with chronic kidney disease. K/L light chain done 10/22/2021 shows normalized LFLC at 12.8 from 292.4 a month ago. K/L light chain ratio is 0.77 from 0.05 a month ago. -No prohibitive toxicities from Daratumumab CyBORD at this time. -Other than some anemia thrombocytopenia patient has no significant other toxicities from her CyBorD regimen at this time. -Patient appropriate to proceed with cycle 5-day 22 of CyBorD with weekly Velcade and dose reduced dexamethasone at 20 mg weekly. -We will continue the same supportive medications -Treatment orders were reviewed and signed  and treatment was coordinated with her nurses in infusion. -She is following with [facility]rology [facility]. -Take Miralax 1x per day or as needed for constipation. -We discussed potential approaches to treatment if her free light chains remain stable in the next 1-2 more cycles of treatment.  Follow-up Plz schedule next 2 cycles of treatment. Labs with each treatment MD visit in 4 weeks  The total time spent in the appointment was 40 minutes*.  All of the patient's questions were answered with apparent satisfaction. The patient knows to call the clinic with  any problems, questions or concerns.   Sullivan Lone MD MS AAHIVMS [facility] Hematology/Oncology Physician [facility]  .*Total Encounter Time as defined by the Centers for Medicare and Medicaid Services includes, in addition to the face-to-face time of a patient visit (documented in the note above) non-face-to-face time: obtaining and reviewing outside history, ordering and reviewing medications, tests or procedures, care coordination (communications with other health care professionals or caregivers) and documentation in the medical record.  I, Melene Muller, am acting as scribe for Dr. Sullivan Lone, MD.  .I have reviewed the above documentation for accuracy and completeness, and I agree with the above. Brunetta Genera MD

## 2021-10-30 LAB — MULTIPLE MYELOMA PANEL, SERUM
Albumin SerPl Elph-Mcnc: 3.4 g/dL (ref 2.9–4.4)
Albumin/Glob SerPl: 1.7 (ref 0.7–1.7)
Alpha 1: 0.3 g/dL (ref 0.0–0.4)
Alpha2 Glob SerPl Elph-Mcnc: 0.6 g/dL (ref 0.4–1.0)
B-Globulin SerPl Elph-Mcnc: 0.8 g/dL (ref 0.7–1.3)
Gamma Glob SerPl Elph-Mcnc: 0.4 g/dL (ref 0.4–1.8)
Globulin, Total: 2.1 g/dL — ABNORMAL LOW (ref 2.2–3.9)
IgA: 26 mg/dL — ABNORMAL LOW (ref 64–422)
IgG (Immunoglobin G), Serum: 428 mg/dL — ABNORMAL LOW (ref 586–1602)
IgM (Immunoglobulin M), Srm: 39 mg/dL (ref 26–217)
M Protein SerPl Elph-Mcnc: 0.1 g/dL — ABNORMAL HIGH
Total Protein ELP: 5.5 g/dL — ABNORMAL LOW (ref 6.0–8.5)

## 2021-11-04 ENCOUNTER — Encounter: Payer: Self-pay | Admitting: Hematology

## 2021-11-05 ENCOUNTER — Inpatient Hospital Stay: Payer: Medicare Other

## 2021-11-05 ENCOUNTER — Other Ambulatory Visit: Payer: Self-pay

## 2021-11-05 ENCOUNTER — Inpatient Hospital Stay: Payer: Medicare Other | Attending: Hematology

## 2021-11-05 VITALS — BP 117/66 | HR 72 | Temp 98.0°F | Resp 18 | Wt 118.0 lb

## 2021-11-05 DIAGNOSIS — C9000 Multiple myeloma not having achieved remission: Secondary | ICD-10-CM | POA: Diagnosis not present

## 2021-11-05 DIAGNOSIS — Z5112 Encounter for antineoplastic immunotherapy: Secondary | ICD-10-CM | POA: Diagnosis not present

## 2021-11-05 DIAGNOSIS — R803 Bence Jones proteinuria: Secondary | ICD-10-CM | POA: Diagnosis not present

## 2021-11-05 DIAGNOSIS — Z7189 Other specified counseling: Secondary | ICD-10-CM

## 2021-11-05 DIAGNOSIS — D8989 Other specified disorders involving the immune mechanism, not elsewhere classified: Secondary | ICD-10-CM

## 2021-11-05 LAB — CMP (CANCER CENTER ONLY)
ALT: 25 U/L (ref 0–44)
AST: 33 U/L (ref 15–41)
Albumin: 4 g/dL (ref 3.5–5.0)
Alkaline Phosphatase: 45 U/L (ref 38–126)
Anion gap: 8 (ref 5–15)
BUN: 24 mg/dL — ABNORMAL HIGH (ref 8–23)
CO2: 25 mmol/L (ref 22–32)
Calcium: 8.9 mg/dL (ref 8.9–10.3)
Chloride: 104 mmol/L (ref 98–111)
Creatinine: 1.2 mg/dL — ABNORMAL HIGH (ref 0.44–1.00)
GFR, Estimated: 48 mL/min — ABNORMAL LOW (ref >60)
Glucose, Bld: 118 mg/dL — ABNORMAL HIGH (ref 70–99)
Potassium: 3.5 mmol/L (ref 3.5–5.1)
Sodium: 137 mmol/L (ref 135–145)
Total Bilirubin: 0.4 mg/dL (ref 0.3–1.2)
Total Protein: 6.1 g/dL — ABNORMAL LOW (ref 6.5–8.1)

## 2021-11-05 LAB — CBC WITH DIFFERENTIAL/PLATELET
Abs Immature Granulocytes: 0.05 10*3/uL (ref 0.00–0.07)
Basophils Absolute: 0.1 10*3/uL (ref 0.0–0.1)
Basophils Relative: 1 %
Eosinophils Absolute: 0.3 10*3/uL (ref 0.0–0.5)
Eosinophils Relative: 9 %
HCT: 25.6 % — ABNORMAL LOW (ref 36.0–46.0)
Hemoglobin: 8.6 g/dL — ABNORMAL LOW (ref 12.0–15.0)
Immature Granulocytes: 1 %
Lymphocytes Relative: 15 %
Lymphs Abs: 0.5 10*3/uL — ABNORMAL LOW (ref 0.7–4.0)
MCH: 33.1 pg (ref 26.0–34.0)
MCHC: 33.6 g/dL (ref 30.0–36.0)
MCV: 98.5 fL (ref 80.0–100.0)
Monocytes Absolute: 0.4 10*3/uL (ref 0.1–1.0)
Monocytes Relative: 13 %
Neutro Abs: 2.1 10*3/uL (ref 1.7–7.7)
Neutrophils Relative %: 61 %
Platelets: 185 10*3/uL (ref 150–400)
RBC: 2.6 MIL/uL — ABNORMAL LOW (ref 3.87–5.11)
RDW: 15 % (ref 11.5–15.5)
WBC: 3.5 10*3/uL — ABNORMAL LOW (ref 4.0–10.5)
nRBC: 0 % (ref 0.0–0.2)

## 2021-11-05 MED ORDER — ONDANSETRON HCL 8 MG PO TABS
8.0000 mg | ORAL_TABLET | Freq: Once | ORAL | Status: AC
  Administered 2021-11-05: 8 mg via ORAL
  Filled 2021-11-05: qty 1

## 2021-11-05 MED ORDER — DARATUMUMAB-HYALURONIDASE-FIHJ 1800-30000 MG-UT/15ML ~~LOC~~ SOLN
1800.0000 mg | Freq: Once | SUBCUTANEOUS | Status: AC
  Administered 2021-11-05: 1800 mg via SUBCUTANEOUS
  Filled 2021-11-05: qty 15

## 2021-11-05 MED ORDER — DIPHENHYDRAMINE HCL 25 MG PO CAPS
50.0000 mg | ORAL_CAPSULE | Freq: Once | ORAL | Status: AC
  Administered 2021-11-05: 50 mg via ORAL
  Filled 2021-11-05: qty 2

## 2021-11-05 MED ORDER — FAMOTIDINE 20 MG PO TABS
20.0000 mg | ORAL_TABLET | Freq: Once | ORAL | Status: AC
  Administered 2021-11-05: 20 mg via ORAL
  Filled 2021-11-05: qty 1

## 2021-11-05 MED ORDER — ACETAMINOPHEN 325 MG PO TABS
650.0000 mg | ORAL_TABLET | Freq: Once | ORAL | Status: AC
  Administered 2021-11-05: 650 mg via ORAL
  Filled 2021-11-05: qty 2

## 2021-11-05 MED ORDER — BORTEZOMIB CHEMO SQ INJECTION 3.5 MG (2.5MG/ML)
1.5000 mg/m2 | Freq: Once | INTRAMUSCULAR | Status: AC
  Administered 2021-11-05: 2.25 mg via SUBCUTANEOUS
  Filled 2021-11-05: qty 0.9

## 2021-11-05 MED ORDER — MONTELUKAST SODIUM 10 MG PO TABS
10.0000 mg | ORAL_TABLET | Freq: Once | ORAL | Status: AC
  Administered 2021-11-05: 10 mg via ORAL
  Filled 2021-11-05: qty 1

## 2021-11-05 MED ORDER — DEXAMETHASONE 4 MG PO TABS
20.0000 mg | ORAL_TABLET | Freq: Once | ORAL | Status: AC
  Administered 2021-11-05: 20 mg via ORAL
  Filled 2021-11-05: qty 5

## 2021-11-05 NOTE — Progress Notes (Signed)
Patient took 10 capsules of '50mg'$  Cyclophosphamide following 30 minute nausea medication.

## 2021-11-05 NOTE — Patient Instructions (Signed)
Daratumumab; Hyaluronidase Injection What is this medication? DARATUMUMAB; HYALURONIDASE (dar a toom ue mab / hye al ur ON i dase) is a monoclonal antibody. Hyaluronidase is used to improve the effects of daratumumab. It treats certain types of cancer. Some of the cancers treated are multiple myeloma and light-chain amyloidosis. This medicine may be used for other purposes; ask your health care provider or pharmacist if you have questions. COMMON BRAND NAME(S): DARZALEX FASPRO What should I tell my care team before I take this medication? They need to know if you have any of these conditions: heart disease infection especially a viral infection such as chickenpox, cold sores, herpes, or hepatitis B lung or breathing disease an unusual or allergic reaction to daratumumab, hyaluronidase, other medicines, foods, dyes, or preservatives pregnant or trying to get pregnant breast-feeding How should I use this medication? This medicine is for injection under the skin. It is given by a health care professional in a hospital or clinic setting. Talk to your pediatrician regarding the use of this medicine in children. Special care may be needed. Overdosage: If you think you have taken too much of this medicine contact a poison control center or emergency room at once. NOTE: This medicine is only for you. Do not share this medicine with others. What if I miss a dose? Keep appointments for follow-up doses as directed. It is important not to miss your dose. Call your doctor or health care professional if you are unable to keep an appointment. What may interact with this medication? Interactions have not been studied. This list may not describe all possible interactions. Give your health care provider a list of all the medicines, herbs, non-prescription drugs, or dietary supplements you use. Also tell them if you smoke, drink alcohol, or use illegal drugs. Some items may interact with your medicine. What  should I watch for while using this medication? Your condition will be monitored carefully while you are receiving this medicine. This medicine can cause serious allergic reactions. To reduce your risk, your health care provider may give you other medicine to take before receiving this one. Be sure to follow the directions from your health care provider. This medicine can affect the results of blood tests to match your blood type. These changes can last for up to 6 months after the final dose. Your healthcare provider will do blood tests to match your blood type before you start treatment. Tell all of your healthcare providers that you are being treated with this medicine before receiving a blood transfusion. This medicine can affect the results of some tests used to determine treatment response; extra tests may be needed to evaluate response. Do not become pregnant while taking this medicine or for 3 months after stopping it. Women should inform their health care provider if they wish to become pregnant or think they might be pregnant. There is a potential for serious side effects to an unborn child. Talk to your health care provider for more information. Do not breast-feed an infant while taking this medicine. What side effects may I notice from receiving this medication? Side effects that you should report to your care team as soon as possible: Allergic reactions--skin rash, itching or hives, swelling of the face, lips, or tongue Blood clot--chest pain, shortness of breath, pain, swelling or warmth in the leg Blurred vision Fast, irregular heartbeat Infection--fever, chills, cough, sore throat, pain or trouble passing urine Injection reactions--dizziness, fast heartbeat, feeling faint or lightheaded, falls, headache, increase in blood pressure, nausea,  vomiting, or wheezing or trouble breathing with loud or whistling sounds Low red blood cell counts--trouble breathing, feeling faint, lightheaded or  falls, unusually weak or tired Unusual bleeding or bruising Side effects that usually do not require medical attention (report these to your care team if they continue or are bothersome): Back pain Constipation Diarrhea Pain, tingling, numbness in the hands or feet Pain, redness, or irritation at site where injected Muscle cramp or pain Swelling of the ankles, feet, hands Tiredness Trouble sleeping This list may not describe all possible side effects. Call your doctor for medical advice about side effects. You may report side effects to FDA at 1-800-FDA-1088. Where should I keep my medication? This drug is given in a hospital or clinic and will not be stored at home. NOTE: This sheet is a summary. It may not cover all possible information. If you have questions about this medicine, talk to your doctor, pharmacist, or health care provider.  2023 Elsevier/Gold Standard (2021-03-23 00:00:00)    Bortezomib injection What is this medication? BORTEZOMIB (bor TEZ oh mib) targets proteins in cancer cells and stops the cancer cells from growing. It treats multiple myeloma and mantle cell lymphoma. This medicine may be used for other purposes; ask your health care provider or pharmacist if you have questions. COMMON BRAND NAME(S): Velcade What should I tell my care team before I take this medication? They need to know if you have any of these conditions: dehydration diabetes (high blood sugar) heart disease liver disease tingling of the fingers or toes or other nerve disorder an unusual or allergic reaction to bortezomib, mannitol, boron, other medicines, foods, dyes, or preservatives pregnant or trying to get pregnant breast-feeding How should I use this medication? This medicine is injected into a vein or under the skin. It is given by a health care provider in a hospital or clinic setting. Talk to your health care provider about the use of this medicine in children. Special care may be  needed. Overdosage: If you think you have taken too much of this medicine contact a poison control center or emergency room at once. NOTE: This medicine is only for you. Do not share this medicine with others. What if I miss a dose? Keep appointments for follow-up doses. It is important not to miss your dose. Call your health care provider if you are unable to keep an appointment. What may interact with this medication? This medicine may interact with the following medications: ketoconazole rifampin This list may not describe all possible interactions. Give your health care provider a list of all the medicines, herbs, non-prescription drugs, or dietary supplements you use. Also tell them if you smoke, drink alcohol, or use illegal drugs. Some items may interact with your medicine. What should I watch for while using this medication? Your condition will be monitored carefully while you are receiving this medicine. You may need blood work done while you are taking this medicine. You may get drowsy or dizzy. Do not drive, use machinery, or do anything that needs mental alertness until you know how this medicine affects you. Do not stand up or sit up quickly, especially if you are an older patient. This reduces the risk of dizzy or fainting spells This medicine may increase your risk of getting an infection. Call your health care provider for advice if you get a fever, chills, sore throat, or other symptoms of a cold or flu. Do not treat yourself. Try to avoid being around people who are sick.   Check with your health care provider if you have severe diarrhea, nausea, and vomiting, or if you sweat a lot. The loss of too much body fluid may make it dangerous for you to take this medicine. Do not become pregnant while taking this medicine or for 7 months after stopping it. Women should inform their health care provider if they wish to become pregnant or think they might be pregnant. Men should not father a  child while taking this medicine and for 4 months after stopping it. There is a potential for serious harm to an unborn child. Talk to your health care provider for more information. Do not breast-feed an infant while taking this medicine or for 2 months after stopping it. This medicine may make it more difficult to get pregnant or father a child. Talk to your health care provider if you are concerned about your fertility. What side effects may I notice from receiving this medication? Side effects that you should report to your doctor or health care professional as soon as possible: allergic reactions (skin rash; itching or hives; swelling of the face, lips, or tongue) bleeding (bloody or black, tarry stools; red or dark brown urine; spitting up blood or brown material that looks like coffee grounds; red spots on the skin; unusual bruising or bleeding from the eye, gums, or nose) blurred vision or changes in vision confusion constipation headache heart failure (trouble breathing; fast, irregular heartbeat; sudden weight gain; swelling of the ankles, feet, hands) infection (fever, chills, cough, sore throat, pain or trouble passing urine) lack or loss of appetite liver injury (dark yellow or brown urine; general ill feeling or flu-like symptoms; loss of appetite, right upper belly pain; yellowing of the eyes or skin) low blood pressure (dizziness; feeling faint or lightheaded, falls; unusually weak or tired) muscle cramps pain, redness, or irritation at site where injected pain, tingling, numbness in the hands or feet seizures trouble breathing unusual bruising or bleeding Side effects that usually do not require medical attention (report to your doctor or health care professional if they continue or are bothersome): diarrhea nausea stomach pain trouble sleeping vomiting This list may not describe all possible side effects. Call your doctor for medical advice about side effects. You may  report side effects to FDA at 1-800-FDA-1088. Where should I keep my medication? This medicine is given in a hospital or clinic. It will not be stored at home. NOTE: This sheet is a summary. It may not cover all possible information. If you have questions about this medicine, talk to your doctor, pharmacist, or health care provider.  2023 Elsevier/Gold Standard (2020-04-13 00:00:00)  

## 2021-11-07 LAB — KAPPA/LAMBDA LIGHT CHAINS
Kappa free light chain: 10.7 mg/L (ref 3.3–19.4)
Kappa, lambda light chain ratio: 0.86 (ref 0.26–1.65)
Lambda free light chains: 12.4 mg/L (ref 5.7–26.3)

## 2021-11-09 ENCOUNTER — Other Ambulatory Visit (HOSPITAL_COMMUNITY): Payer: Self-pay

## 2021-11-12 ENCOUNTER — Inpatient Hospital Stay: Payer: Medicare Other

## 2021-11-12 ENCOUNTER — Other Ambulatory Visit: Payer: Self-pay

## 2021-11-12 VITALS — BP 105/64 | HR 66 | Temp 97.9°F | Resp 18 | Wt 115.5 lb

## 2021-11-12 DIAGNOSIS — Z5112 Encounter for antineoplastic immunotherapy: Secondary | ICD-10-CM | POA: Diagnosis not present

## 2021-11-12 DIAGNOSIS — R803 Bence Jones proteinuria: Secondary | ICD-10-CM | POA: Diagnosis not present

## 2021-11-12 DIAGNOSIS — D8989 Other specified disorders involving the immune mechanism, not elsewhere classified: Secondary | ICD-10-CM

## 2021-11-12 DIAGNOSIS — Z7189 Other specified counseling: Secondary | ICD-10-CM

## 2021-11-12 DIAGNOSIS — C9000 Multiple myeloma not having achieved remission: Secondary | ICD-10-CM | POA: Diagnosis not present

## 2021-11-12 LAB — CBC WITH DIFFERENTIAL/PLATELET
Abs Immature Granulocytes: 0.03 10*3/uL (ref 0.00–0.07)
Basophils Absolute: 0 10*3/uL (ref 0.0–0.1)
Basophils Relative: 1 %
Eosinophils Absolute: 0.3 10*3/uL (ref 0.0–0.5)
Eosinophils Relative: 9 %
HCT: 25.4 % — ABNORMAL LOW (ref 36.0–46.0)
Hemoglobin: 8.6 g/dL — ABNORMAL LOW (ref 12.0–15.0)
Immature Granulocytes: 1 %
Lymphocytes Relative: 19 %
Lymphs Abs: 0.6 10*3/uL — ABNORMAL LOW (ref 0.7–4.0)
MCH: 33.3 pg (ref 26.0–34.0)
MCHC: 33.9 g/dL (ref 30.0–36.0)
MCV: 98.4 fL (ref 80.0–100.0)
Monocytes Absolute: 0.4 10*3/uL (ref 0.1–1.0)
Monocytes Relative: 14 %
Neutro Abs: 1.8 10*3/uL (ref 1.7–7.7)
Neutrophils Relative %: 56 %
Platelets: 187 10*3/uL (ref 150–400)
RBC: 2.58 MIL/uL — ABNORMAL LOW (ref 3.87–5.11)
RDW: 14.5 % (ref 11.5–15.5)
WBC: 3.2 10*3/uL — ABNORMAL LOW (ref 4.0–10.5)
nRBC: 0 % (ref 0.0–0.2)

## 2021-11-12 LAB — CMP (CANCER CENTER ONLY)
ALT: 28 U/L (ref 0–44)
AST: 33 U/L (ref 15–41)
Albumin: 3.9 g/dL (ref 3.5–5.0)
Alkaline Phosphatase: 42 U/L (ref 38–126)
Anion gap: 5 (ref 5–15)
BUN: 18 mg/dL (ref 8–23)
CO2: 27 mmol/L (ref 22–32)
Calcium: 8.9 mg/dL (ref 8.9–10.3)
Chloride: 106 mmol/L (ref 98–111)
Creatinine: 1.11 mg/dL — ABNORMAL HIGH (ref 0.44–1.00)
GFR, Estimated: 53 mL/min — ABNORMAL LOW (ref >60)
Glucose, Bld: 100 mg/dL — ABNORMAL HIGH (ref 70–99)
Potassium: 3.1 mmol/L — ABNORMAL LOW (ref 3.5–5.1)
Sodium: 138 mmol/L (ref 135–145)
Total Bilirubin: 0.4 mg/dL (ref 0.3–1.2)
Total Protein: 6.2 g/dL — ABNORMAL LOW (ref 6.5–8.1)

## 2021-11-12 LAB — MULTIPLE MYELOMA PANEL, SERUM
Albumin SerPl Elph-Mcnc: 3.6 g/dL (ref 2.9–4.4)
Albumin/Glob SerPl: 1.7 (ref 0.7–1.7)
Alpha 1: 0.3 g/dL (ref 0.0–0.4)
Alpha2 Glob SerPl Elph-Mcnc: 0.6 g/dL (ref 0.4–1.0)
B-Globulin SerPl Elph-Mcnc: 0.9 g/dL (ref 0.7–1.3)
Gamma Glob SerPl Elph-Mcnc: 0.4 g/dL (ref 0.4–1.8)
Globulin, Total: 2.2 g/dL (ref 2.2–3.9)
IgA: 23 mg/dL — ABNORMAL LOW (ref 64–422)
IgG (Immunoglobin G), Serum: 468 mg/dL — ABNORMAL LOW (ref 586–1602)
IgM (Immunoglobulin M), Srm: 43 mg/dL (ref 26–217)
M Protein SerPl Elph-Mcnc: 0.2 g/dL — ABNORMAL HIGH
Total Protein ELP: 5.8 g/dL — ABNORMAL LOW (ref 6.0–8.5)

## 2021-11-12 MED ORDER — DIPHENHYDRAMINE HCL 25 MG PO CAPS
50.0000 mg | ORAL_CAPSULE | Freq: Once | ORAL | Status: AC
  Administered 2021-11-12: 50 mg via ORAL
  Filled 2021-11-12: qty 2

## 2021-11-12 MED ORDER — ONDANSETRON HCL 8 MG PO TABS
8.0000 mg | ORAL_TABLET | Freq: Once | ORAL | Status: AC
  Administered 2021-11-12: 8 mg via ORAL
  Filled 2021-11-12: qty 1

## 2021-11-12 MED ORDER — FAMOTIDINE 20 MG PO TABS
20.0000 mg | ORAL_TABLET | Freq: Once | ORAL | Status: AC
  Administered 2021-11-12: 20 mg via ORAL
  Filled 2021-11-12: qty 1

## 2021-11-12 MED ORDER — ACETAMINOPHEN 325 MG PO TABS
650.0000 mg | ORAL_TABLET | Freq: Once | ORAL | Status: AC
  Administered 2021-11-12: 650 mg via ORAL
  Filled 2021-11-12: qty 2

## 2021-11-12 MED ORDER — DARATUMUMAB-HYALURONIDASE-FIHJ 1800-30000 MG-UT/15ML ~~LOC~~ SOLN
1800.0000 mg | Freq: Once | SUBCUTANEOUS | Status: AC
  Administered 2021-11-12: 1800 mg via SUBCUTANEOUS
  Filled 2021-11-12: qty 15

## 2021-11-12 MED ORDER — DEXAMETHASONE 4 MG PO TABS
20.0000 mg | ORAL_TABLET | Freq: Once | ORAL | Status: AC
  Administered 2021-11-12: 20 mg via ORAL
  Filled 2021-11-12: qty 5

## 2021-11-12 MED ORDER — BORTEZOMIB CHEMO SQ INJECTION 3.5 MG (2.5MG/ML)
1.5000 mg/m2 | Freq: Once | INTRAMUSCULAR | Status: AC
  Administered 2021-11-12: 2.25 mg via SUBCUTANEOUS
  Filled 2021-11-12: qty 0.9

## 2021-11-12 MED ORDER — MONTELUKAST SODIUM 10 MG PO TABS
10.0000 mg | ORAL_TABLET | Freq: Once | ORAL | Status: AC
  Administered 2021-11-12: 10 mg via ORAL
  Filled 2021-11-12: qty 1

## 2021-11-13 DIAGNOSIS — N183 Chronic kidney disease, stage 3 unspecified: Secondary | ICD-10-CM | POA: Diagnosis not present

## 2021-11-13 DIAGNOSIS — N1831 Chronic kidney disease, stage 3a: Secondary | ICD-10-CM | POA: Diagnosis not present

## 2021-11-13 DIAGNOSIS — R197 Diarrhea, unspecified: Secondary | ICD-10-CM | POA: Diagnosis not present

## 2021-11-13 DIAGNOSIS — Z885 Allergy status to narcotic agent status: Secondary | ICD-10-CM | POA: Diagnosis not present

## 2021-11-13 DIAGNOSIS — D509 Iron deficiency anemia, unspecified: Secondary | ICD-10-CM | POA: Diagnosis not present

## 2021-11-13 DIAGNOSIS — I422 Other hypertrophic cardiomyopathy: Secondary | ICD-10-CM | POA: Diagnosis not present

## 2021-11-13 DIAGNOSIS — R5383 Other fatigue: Secondary | ICD-10-CM | POA: Diagnosis not present

## 2021-11-13 DIAGNOSIS — D801 Nonfamilial hypogammaglobulinemia: Secondary | ICD-10-CM | POA: Diagnosis not present

## 2021-11-13 DIAGNOSIS — D631 Anemia in chronic kidney disease: Secondary | ICD-10-CM | POA: Diagnosis not present

## 2021-11-13 DIAGNOSIS — E8581 Light chain (AL) amyloidosis: Secondary | ICD-10-CM | POA: Diagnosis not present

## 2021-11-13 DIAGNOSIS — D6481 Anemia due to antineoplastic chemotherapy: Secondary | ICD-10-CM | POA: Diagnosis not present

## 2021-11-13 DIAGNOSIS — T451X5A Adverse effect of antineoplastic and immunosuppressive drugs, initial encounter: Secondary | ICD-10-CM | POA: Diagnosis not present

## 2021-11-13 DIAGNOSIS — C9001 Multiple myeloma in remission: Secondary | ICD-10-CM | POA: Diagnosis not present

## 2021-11-13 DIAGNOSIS — Z79899 Other long term (current) drug therapy: Secondary | ICD-10-CM | POA: Diagnosis not present

## 2021-11-13 DIAGNOSIS — D8989 Other specified disorders involving the immune mechanism, not elsewhere classified: Secondary | ICD-10-CM | POA: Diagnosis not present

## 2021-11-14 ENCOUNTER — Other Ambulatory Visit (HOSPITAL_COMMUNITY): Payer: Self-pay

## 2021-11-19 ENCOUNTER — Inpatient Hospital Stay: Payer: Medicare Other

## 2021-11-19 ENCOUNTER — Other Ambulatory Visit: Payer: Self-pay

## 2021-11-19 VITALS — BP 128/79 | HR 70 | Temp 97.7°F | Resp 17 | Wt 116.8 lb

## 2021-11-19 DIAGNOSIS — C9000 Multiple myeloma not having achieved remission: Secondary | ICD-10-CM

## 2021-11-19 DIAGNOSIS — Z5112 Encounter for antineoplastic immunotherapy: Secondary | ICD-10-CM | POA: Diagnosis not present

## 2021-11-19 DIAGNOSIS — Z7189 Other specified counseling: Secondary | ICD-10-CM

## 2021-11-19 DIAGNOSIS — D8989 Other specified disorders involving the immune mechanism, not elsewhere classified: Secondary | ICD-10-CM

## 2021-11-19 DIAGNOSIS — R803 Bence Jones proteinuria: Secondary | ICD-10-CM | POA: Diagnosis not present

## 2021-11-19 LAB — CBC WITH DIFFERENTIAL/PLATELET
Abs Immature Granulocytes: 0.04 10*3/uL (ref 0.00–0.07)
Basophils Absolute: 0 10*3/uL (ref 0.0–0.1)
Basophils Relative: 1 %
Eosinophils Absolute: 0.2 10*3/uL (ref 0.0–0.5)
Eosinophils Relative: 8 %
HCT: 24.2 % — ABNORMAL LOW (ref 36.0–46.0)
Hemoglobin: 8 g/dL — ABNORMAL LOW (ref 12.0–15.0)
Immature Granulocytes: 1 %
Lymphocytes Relative: 17 %
Lymphs Abs: 0.5 10*3/uL — ABNORMAL LOW (ref 0.7–4.0)
MCH: 32.4 pg (ref 26.0–34.0)
MCHC: 33.1 g/dL (ref 30.0–36.0)
MCV: 98 fL (ref 80.0–100.0)
Monocytes Absolute: 0.4 10*3/uL (ref 0.1–1.0)
Monocytes Relative: 14 %
Neutro Abs: 1.7 10*3/uL (ref 1.7–7.7)
Neutrophils Relative %: 59 %
Platelets: 174 10*3/uL (ref 150–400)
RBC: 2.47 MIL/uL — ABNORMAL LOW (ref 3.87–5.11)
RDW: 13.9 % (ref 11.5–15.5)
WBC: 2.8 10*3/uL — ABNORMAL LOW (ref 4.0–10.5)
nRBC: 0 % (ref 0.0–0.2)

## 2021-11-19 LAB — CMP (CANCER CENTER ONLY)
ALT: 28 U/L (ref 0–44)
AST: 34 U/L (ref 15–41)
Albumin: 3.7 g/dL (ref 3.5–5.0)
Alkaline Phosphatase: 39 U/L (ref 38–126)
Anion gap: 5 (ref 5–15)
BUN: 21 mg/dL (ref 8–23)
CO2: 28 mmol/L (ref 22–32)
Calcium: 8.8 mg/dL — ABNORMAL LOW (ref 8.9–10.3)
Chloride: 104 mmol/L (ref 98–111)
Creatinine: 1 mg/dL (ref 0.44–1.00)
GFR, Estimated: 60 mL/min — ABNORMAL LOW (ref >60)
Glucose, Bld: 78 mg/dL (ref 70–99)
Potassium: 3.3 mmol/L — ABNORMAL LOW (ref 3.5–5.1)
Sodium: 137 mmol/L (ref 135–145)
Total Bilirubin: 0.4 mg/dL (ref 0.3–1.2)
Total Protein: 5.7 g/dL — ABNORMAL LOW (ref 6.5–8.1)

## 2021-11-19 MED ORDER — DEXAMETHASONE 4 MG PO TABS
20.0000 mg | ORAL_TABLET | Freq: Once | ORAL | Status: AC
  Administered 2021-11-19: 20 mg via ORAL
  Filled 2021-11-19: qty 5

## 2021-11-19 MED ORDER — ACETAMINOPHEN 325 MG PO TABS
650.0000 mg | ORAL_TABLET | Freq: Once | ORAL | Status: AC
  Administered 2021-11-19: 650 mg via ORAL
  Filled 2021-11-19: qty 2

## 2021-11-19 MED ORDER — DIPHENHYDRAMINE HCL 25 MG PO CAPS
50.0000 mg | ORAL_CAPSULE | Freq: Once | ORAL | Status: AC
  Administered 2021-11-19: 50 mg via ORAL
  Filled 2021-11-19: qty 2

## 2021-11-19 MED ORDER — FAMOTIDINE 20 MG PO TABS
20.0000 mg | ORAL_TABLET | Freq: Once | ORAL | Status: AC
  Administered 2021-11-19: 20 mg via ORAL
  Filled 2021-11-19: qty 1

## 2021-11-19 MED ORDER — DARATUMUMAB-HYALURONIDASE-FIHJ 1800-30000 MG-UT/15ML ~~LOC~~ SOLN
1800.0000 mg | Freq: Once | SUBCUTANEOUS | Status: AC
  Administered 2021-11-19: 1800 mg via SUBCUTANEOUS
  Filled 2021-11-19: qty 15

## 2021-11-19 MED ORDER — POLYSACCHARIDE IRON COMPLEX 150 MG PO CAPS: 150.0000 mg | ORAL_CAPSULE | Freq: Every day | ORAL | 2 refills | Status: DC

## 2021-11-19 MED ORDER — MONTELUKAST SODIUM 10 MG PO TABS
10.0000 mg | ORAL_TABLET | Freq: Once | ORAL | Status: AC
  Administered 2021-11-19: 10 mg via ORAL
  Filled 2021-11-19: qty 1

## 2021-11-19 MED ORDER — BORTEZOMIB CHEMO SQ INJECTION 3.5 MG (2.5MG/ML)
1.5000 mg/m2 | Freq: Once | INTRAMUSCULAR | Status: AC
  Administered 2021-11-19: 2.25 mg via SUBCUTANEOUS
  Filled 2021-11-19: qty 0.9

## 2021-11-19 MED ORDER — ONDANSETRON HCL 8 MG PO TABS
8.0000 mg | ORAL_TABLET | Freq: Once | ORAL | Status: AC
  Administered 2021-11-19: 8 mg via ORAL
  Filled 2021-11-19: qty 1

## 2021-11-21 ENCOUNTER — Other Ambulatory Visit: Payer: Self-pay | Admitting: Hematology and Oncology

## 2021-11-26 ENCOUNTER — Inpatient Hospital Stay: Payer: Medicare Other

## 2021-11-26 ENCOUNTER — Other Ambulatory Visit: Payer: Self-pay

## 2021-11-26 VITALS — BP 128/79 | HR 75 | Temp 97.7°F | Resp 17 | Wt 116.0 lb

## 2021-11-26 DIAGNOSIS — Z5112 Encounter for antineoplastic immunotherapy: Secondary | ICD-10-CM | POA: Diagnosis not present

## 2021-11-26 DIAGNOSIS — D8989 Other specified disorders involving the immune mechanism, not elsewhere classified: Secondary | ICD-10-CM

## 2021-11-26 DIAGNOSIS — R803 Bence Jones proteinuria: Secondary | ICD-10-CM | POA: Diagnosis not present

## 2021-11-26 DIAGNOSIS — C9000 Multiple myeloma not having achieved remission: Secondary | ICD-10-CM | POA: Diagnosis not present

## 2021-11-26 DIAGNOSIS — Z7189 Other specified counseling: Secondary | ICD-10-CM

## 2021-11-26 LAB — CMP (CANCER CENTER ONLY)
ALT: 34 U/L (ref 0–44)
AST: 36 U/L (ref 15–41)
Albumin: 4 g/dL (ref 3.5–5.0)
Alkaline Phosphatase: 44 U/L (ref 38–126)
Anion gap: 7 (ref 5–15)
BUN: 24 mg/dL — ABNORMAL HIGH (ref 8–23)
CO2: 28 mmol/L (ref 22–32)
Calcium: 8.8 mg/dL — ABNORMAL LOW (ref 8.9–10.3)
Chloride: 104 mmol/L (ref 98–111)
Creatinine: 1.09 mg/dL — ABNORMAL HIGH (ref 0.44–1.00)
GFR, Estimated: 54 mL/min — ABNORMAL LOW (ref >60)
Glucose, Bld: 94 mg/dL (ref 70–99)
Potassium: 3.5 mmol/L (ref 3.5–5.1)
Sodium: 139 mmol/L (ref 135–145)
Total Bilirubin: 0.3 mg/dL (ref 0.3–1.2)
Total Protein: 6 g/dL — ABNORMAL LOW (ref 6.5–8.1)

## 2021-11-26 LAB — CBC WITH DIFFERENTIAL/PLATELET
Abs Immature Granulocytes: 0.04 10*3/uL (ref 0.00–0.07)
Basophils Absolute: 0.1 10*3/uL (ref 0.0–0.1)
Basophils Relative: 2 %
Eosinophils Absolute: 0.2 10*3/uL (ref 0.0–0.5)
Eosinophils Relative: 8 %
HCT: 25.2 % — ABNORMAL LOW (ref 36.0–46.0)
Hemoglobin: 8.7 g/dL — ABNORMAL LOW (ref 12.0–15.0)
Immature Granulocytes: 2 %
Lymphocytes Relative: 15 %
Lymphs Abs: 0.4 10*3/uL — ABNORMAL LOW (ref 0.7–4.0)
MCH: 34 pg (ref 26.0–34.0)
MCHC: 34.5 g/dL (ref 30.0–36.0)
MCV: 98.4 fL (ref 80.0–100.0)
Monocytes Absolute: 0.4 10*3/uL (ref 0.1–1.0)
Monocytes Relative: 13 %
Neutro Abs: 1.7 10*3/uL (ref 1.7–7.7)
Neutrophils Relative %: 60 %
Platelets: 198 10*3/uL (ref 150–400)
RBC: 2.56 MIL/uL — ABNORMAL LOW (ref 3.87–5.11)
RDW: 13.9 % (ref 11.5–15.5)
WBC: 2.7 10*3/uL — ABNORMAL LOW (ref 4.0–10.5)
nRBC: 0 % (ref 0.0–0.2)

## 2021-11-26 MED ORDER — MONTELUKAST SODIUM 10 MG PO TABS
10.0000 mg | ORAL_TABLET | Freq: Once | ORAL | Status: AC
  Administered 2021-11-26: 10 mg via ORAL
  Filled 2021-11-26: qty 1

## 2021-11-26 MED ORDER — ACETAMINOPHEN 325 MG PO TABS
650.0000 mg | ORAL_TABLET | Freq: Once | ORAL | Status: AC
  Administered 2021-11-26: 650 mg via ORAL
  Filled 2021-11-26: qty 2

## 2021-11-26 MED ORDER — BORTEZOMIB CHEMO SQ INJECTION 3.5 MG (2.5MG/ML)
1.5000 mg/m2 | Freq: Once | INTRAMUSCULAR | Status: AC
  Administered 2021-11-26: 2.25 mg via SUBCUTANEOUS
  Filled 2021-11-26: qty 0.9

## 2021-11-26 MED ORDER — DEXAMETHASONE 4 MG PO TABS
20.0000 mg | ORAL_TABLET | Freq: Once | ORAL | Status: AC
  Administered 2021-11-26: 20 mg via ORAL
  Filled 2021-11-26: qty 5

## 2021-11-26 MED ORDER — ONDANSETRON HCL 8 MG PO TABS
8.0000 mg | ORAL_TABLET | Freq: Once | ORAL | Status: AC
  Administered 2021-11-26: 8 mg via ORAL
  Filled 2021-11-26: qty 1

## 2021-11-26 MED ORDER — DIPHENHYDRAMINE HCL 25 MG PO CAPS
50.0000 mg | ORAL_CAPSULE | Freq: Once | ORAL | Status: AC
  Administered 2021-11-26: 50 mg via ORAL
  Filled 2021-11-26: qty 2

## 2021-11-26 MED ORDER — DARATUMUMAB-HYALURONIDASE-FIHJ 1800-30000 MG-UT/15ML ~~LOC~~ SOLN
1800.0000 mg | Freq: Once | SUBCUTANEOUS | Status: AC
  Administered 2021-11-26: 1800 mg via SUBCUTANEOUS
  Filled 2021-11-26: qty 15

## 2021-11-26 MED ORDER — FAMOTIDINE 20 MG PO TABS
20.0000 mg | ORAL_TABLET | Freq: Once | ORAL | Status: AC
  Administered 2021-11-26: 20 mg via ORAL
  Filled 2021-11-26: qty 1

## 2021-11-26 NOTE — Progress Notes (Signed)
Pt took PO cytoxan in infusion.  10 tablets for '500mg'$  total

## 2021-11-29 ENCOUNTER — Encounter: Payer: Self-pay | Admitting: Hematology

## 2021-11-29 ENCOUNTER — Other Ambulatory Visit: Payer: Self-pay | Admitting: Hematology

## 2021-11-29 NOTE — Progress Notes (Signed)
Given good response and per Dr. Phoebe Perch recommendations. We shall now discontinue Velcade and Cytoxan. Patient will complete cycle 6 of treatment with weekly daratumumab. We will switch to every 2-week daratumumab from subsequent cycles. Dexamethasone premedication reduced to 10 mg with daratumumab. MD visit with treatment on 12/10/2021

## 2021-12-02 ENCOUNTER — Other Ambulatory Visit: Payer: Self-pay

## 2021-12-03 ENCOUNTER — Inpatient Hospital Stay: Payer: Medicare Other

## 2021-12-03 ENCOUNTER — Other Ambulatory Visit: Payer: Self-pay | Admitting: Hematology

## 2021-12-03 ENCOUNTER — Other Ambulatory Visit: Payer: Self-pay

## 2021-12-03 VITALS — BP 120/80 | HR 71 | Temp 97.9°F | Resp 17 | Wt 114.8 lb

## 2021-12-03 DIAGNOSIS — D8989 Other specified disorders involving the immune mechanism, not elsewhere classified: Secondary | ICD-10-CM

## 2021-12-03 DIAGNOSIS — Z5112 Encounter for antineoplastic immunotherapy: Secondary | ICD-10-CM | POA: Diagnosis not present

## 2021-12-03 DIAGNOSIS — R803 Bence Jones proteinuria: Secondary | ICD-10-CM | POA: Diagnosis not present

## 2021-12-03 DIAGNOSIS — C9000 Multiple myeloma not having achieved remission: Secondary | ICD-10-CM | POA: Diagnosis not present

## 2021-12-03 DIAGNOSIS — Z7189 Other specified counseling: Secondary | ICD-10-CM

## 2021-12-03 LAB — CMP (CANCER CENTER ONLY)
ALT: 40 U/L (ref 0–44)
AST: 39 U/L (ref 15–41)
Albumin: 4.1 g/dL (ref 3.5–5.0)
Alkaline Phosphatase: 42 U/L (ref 38–126)
Anion gap: 7 (ref 5–15)
BUN: 27 mg/dL — ABNORMAL HIGH (ref 8–23)
CO2: 29 mmol/L (ref 22–32)
Calcium: 9.1 mg/dL (ref 8.9–10.3)
Chloride: 101 mmol/L (ref 98–111)
Creatinine: 1.17 mg/dL — ABNORMAL HIGH (ref 0.44–1.00)
GFR, Estimated: 50 mL/min — ABNORMAL LOW (ref >60)
Glucose, Bld: 99 mg/dL (ref 70–99)
Potassium: 3.5 mmol/L (ref 3.5–5.1)
Sodium: 137 mmol/L (ref 135–145)
Total Bilirubin: 0.4 mg/dL (ref 0.3–1.2)
Total Protein: 6.4 g/dL — ABNORMAL LOW (ref 6.5–8.1)

## 2021-12-03 LAB — CBC WITH DIFFERENTIAL/PLATELET
Abs Immature Granulocytes: 0.03 10*3/uL (ref 0.00–0.07)
Basophils Absolute: 0.1 10*3/uL (ref 0.0–0.1)
Basophils Relative: 2 %
Eosinophils Absolute: 0.3 10*3/uL (ref 0.0–0.5)
Eosinophils Relative: 8 %
HCT: 26.2 % — ABNORMAL LOW (ref 36.0–46.0)
Hemoglobin: 8.9 g/dL — ABNORMAL LOW (ref 12.0–15.0)
Immature Granulocytes: 1 %
Lymphocytes Relative: 18 %
Lymphs Abs: 0.6 10*3/uL — ABNORMAL LOW (ref 0.7–4.0)
MCH: 32.8 pg (ref 26.0–34.0)
MCHC: 34 g/dL (ref 30.0–36.0)
MCV: 96.7 fL (ref 80.0–100.0)
Monocytes Absolute: 0.4 10*3/uL (ref 0.1–1.0)
Monocytes Relative: 12 %
Neutro Abs: 2 10*3/uL (ref 1.7–7.7)
Neutrophils Relative %: 59 %
Platelets: 181 10*3/uL (ref 150–400)
RBC: 2.71 MIL/uL — ABNORMAL LOW (ref 3.87–5.11)
RDW: 14 % (ref 11.5–15.5)
WBC: 3.3 10*3/uL — ABNORMAL LOW (ref 4.0–10.5)
nRBC: 0 % (ref 0.0–0.2)

## 2021-12-03 MED ORDER — DIPHENHYDRAMINE HCL 25 MG PO CAPS
50.0000 mg | ORAL_CAPSULE | Freq: Once | ORAL | Status: AC
  Administered 2021-12-03: 50 mg via ORAL
  Filled 2021-12-03: qty 2

## 2021-12-03 MED ORDER — DARATUMUMAB-HYALURONIDASE-FIHJ 1800-30000 MG-UT/15ML ~~LOC~~ SOLN
1800.0000 mg | Freq: Once | SUBCUTANEOUS | Status: AC
  Administered 2021-12-03: 1800 mg via SUBCUTANEOUS
  Filled 2021-12-03: qty 15

## 2021-12-03 MED ORDER — MONTELUKAST SODIUM 10 MG PO TABS
10.0000 mg | ORAL_TABLET | Freq: Once | ORAL | Status: AC
  Administered 2021-12-03: 10 mg via ORAL
  Filled 2021-12-03: qty 1

## 2021-12-03 MED ORDER — ONDANSETRON HCL 8 MG PO TABS
8.0000 mg | ORAL_TABLET | Freq: Once | ORAL | Status: AC
  Administered 2021-12-03: 8 mg via ORAL
  Filled 2021-12-03: qty 1

## 2021-12-03 MED ORDER — FAMOTIDINE 20 MG PO TABS
20.0000 mg | ORAL_TABLET | Freq: Once | ORAL | Status: AC
  Administered 2021-12-03: 20 mg via ORAL
  Filled 2021-12-03: qty 1

## 2021-12-03 MED ORDER — ACETAMINOPHEN 325 MG PO TABS
650.0000 mg | ORAL_TABLET | Freq: Once | ORAL | Status: AC
  Administered 2021-12-03: 650 mg via ORAL
  Filled 2021-12-03: qty 2

## 2021-12-03 MED ORDER — DEXAMETHASONE 4 MG PO TABS
10.0000 mg | ORAL_TABLET | Freq: Once | ORAL | Status: AC
  Administered 2021-12-03: 10 mg via ORAL
  Filled 2021-12-03: qty 3

## 2021-12-03 NOTE — Patient Instructions (Signed)
[facility]  Discharge Instructions: Thank you for choosing [facility] to provide your oncology and hematology care.   If you have a lab appointment with the [facility], please go directly to the [facility] and check in at the registration area.   Wear comfortable clothing and clothing appropriate for easy access to any Portacath or PICC line.   We strive to give you quality time with your provider. You may need to reschedule your appointment if you arrive late (15 or more minutes).  Arriving late affects you and other patients whose appointments are after yours.  Also, if you miss three or more appointments without notifying the office, you may be dismissed from the clinic at the provider's discretion.      For prescription refill requests, have your pharmacy contact our office and allow 72 hours for refills to be completed.    Today you received the following chemotherapy and/or immunotherapy agents: Darzalex Faspro      To help prevent nausea and vomiting after your treatment, we encourage you to take your nausea medication as directed.  BELOW ARE SYMPTOMS THAT SHOULD BE REPORTED IMMEDIATELY: *FEVER GREATER THAN 100.4 F (38 C) OR HIGHER *CHILLS OR SWEATING *NAUSEA AND VOMITING THAT IS NOT CONTROLLED WITH YOUR NAUSEA MEDICATION *UNUSUAL SHORTNESS OF BREATH *UNUSUAL BRUISING OR BLEEDING *URINARY PROBLEMS (pain or burning when urinating, or frequent urination) *BOWEL PROBLEMS (unusual diarrhea, constipation, pain near the anus) TENDERNESS IN MOUTH AND THROAT WITH OR WITHOUT PRESENCE OF ULCERS (sore throat, sores in mouth, or a toothache) UNUSUAL RASH, SWELLING OR PAIN  UNUSUAL VAGINAL DISCHARGE OR ITCHING   Items with * indicate a potential emergency and should be followed up as soon as possible or go to the Emergency Department if any problems should occur.  Please show the CHEMOTHERAPY ALERT CARD or IMMUNOTHERAPY ALERT CARD at  check-in to the Emergency Department and triage nurse.  Should you have questions after your visit or need to cancel or reschedule your appointment, please contact [facility]  Dept: [phone]  and follow the prompts.  Office hours are 8:00 a.m. to 4:30 p.m. Monday - Friday. Please note that voicemails left after 4:00 p.m. may not be returned until the following business day.  We are closed weekends and major holidays. You have access to a nurse at all times for urgent questions. Please call the main number to the clinic Dept: [phone] and follow the prompts.   For any non-urgent questions, you may also contact your provider using MyChart. We now offer e-Visits for anyone 73 and older to request care online for non-urgent symptoms. For details visit [email or web address].   Also download the MyChart app! Go to the app store, search "MyChart", open the app, select [facility], and log in with your MyChart username and password.  Masks are optional in the cancer centers. If you would like for your care team to wear a mask while they are taking care of you, please let them know. For doctor visits, patients may have with them one support person who is at least 73 years old. At this time, visitors are not allowed in the infusion area.

## 2021-12-03 NOTE — Progress Notes (Signed)
Darzalex only 7/31 then next dose in 2 weeks. Shawn Stall, RN will be changing appointments.   Keep premeds as is 7/31 since decr Dex & if pt tol ok then will consider deleting Zofran and switching Benadryl to either PO Claritin/Zyrtec. Pt c/o excessive sleepiness w/ Benadryl.  Dr. Irene Limbo aware.  Kennith Center, Pharm.D., CPP 12/03/2021'@12'$ :38 PM

## 2021-12-04 ENCOUNTER — Other Ambulatory Visit: Payer: Self-pay

## 2021-12-05 ENCOUNTER — Encounter: Payer: Self-pay | Admitting: Hematology

## 2021-12-06 ENCOUNTER — Other Ambulatory Visit (HOSPITAL_COMMUNITY): Payer: Self-pay

## 2021-12-10 ENCOUNTER — Other Ambulatory Visit (HOSPITAL_COMMUNITY): Payer: Self-pay

## 2021-12-10 ENCOUNTER — Inpatient Hospital Stay: Payer: Medicare Other

## 2021-12-10 ENCOUNTER — Inpatient Hospital Stay: Payer: Medicare Other | Admitting: Hematology

## 2021-12-12 ENCOUNTER — Telehealth: Payer: Self-pay | Admitting: Hematology

## 2021-12-12 NOTE — Telephone Encounter (Signed)
Left message with rescheduled upcoming appointment per 8/9 secure chat.

## 2021-12-13 ENCOUNTER — Other Ambulatory Visit (HOSPITAL_BASED_OUTPATIENT_CLINIC_OR_DEPARTMENT_OTHER): Payer: Self-pay | Admitting: *Deleted

## 2021-12-13 DIAGNOSIS — I3139 Other pericardial effusion (noninflammatory): Secondary | ICD-10-CM

## 2021-12-14 ENCOUNTER — Other Ambulatory Visit: Payer: Self-pay

## 2021-12-17 ENCOUNTER — Inpatient Hospital Stay: Payer: Medicare Other

## 2021-12-17 ENCOUNTER — Inpatient Hospital Stay (HOSPITAL_BASED_OUTPATIENT_CLINIC_OR_DEPARTMENT_OTHER): Payer: Medicare Other | Admitting: Hematology

## 2021-12-17 ENCOUNTER — Other Ambulatory Visit: Payer: Self-pay | Admitting: Hematology

## 2021-12-17 ENCOUNTER — Other Ambulatory Visit: Payer: Self-pay

## 2021-12-17 ENCOUNTER — Inpatient Hospital Stay: Payer: Medicare Other | Attending: Hematology

## 2021-12-17 ENCOUNTER — Inpatient Hospital Stay: Payer: Medicare Other | Admitting: Hematology

## 2021-12-17 VITALS — BP 116/71 | HR 73 | Temp 97.9°F | Resp 14 | Wt 118.0 lb

## 2021-12-17 VITALS — BP 115/70 | HR 70 | Temp 98.0°F | Resp 16

## 2021-12-17 DIAGNOSIS — Z853 Personal history of malignant neoplasm of breast: Secondary | ICD-10-CM | POA: Diagnosis not present

## 2021-12-17 DIAGNOSIS — Z833 Family history of diabetes mellitus: Secondary | ICD-10-CM | POA: Diagnosis not present

## 2021-12-17 DIAGNOSIS — Z8249 Family history of ischemic heart disease and other diseases of the circulatory system: Secondary | ICD-10-CM | POA: Diagnosis not present

## 2021-12-17 DIAGNOSIS — Z79899 Other long term (current) drug therapy: Secondary | ICD-10-CM | POA: Diagnosis not present

## 2021-12-17 DIAGNOSIS — Z87891 Personal history of nicotine dependence: Secondary | ICD-10-CM | POA: Diagnosis not present

## 2021-12-17 DIAGNOSIS — Z85828 Personal history of other malignant neoplasm of skin: Secondary | ICD-10-CM | POA: Diagnosis not present

## 2021-12-17 DIAGNOSIS — I10 Essential (primary) hypertension: Secondary | ICD-10-CM | POA: Diagnosis not present

## 2021-12-17 DIAGNOSIS — Z800 Family history of malignant neoplasm of digestive organs: Secondary | ICD-10-CM | POA: Diagnosis not present

## 2021-12-17 DIAGNOSIS — I422 Other hypertrophic cardiomyopathy: Secondary | ICD-10-CM | POA: Diagnosis not present

## 2021-12-17 DIAGNOSIS — D649 Anemia, unspecified: Secondary | ICD-10-CM

## 2021-12-17 DIAGNOSIS — Z801 Family history of malignant neoplasm of trachea, bronchus and lung: Secondary | ICD-10-CM | POA: Diagnosis not present

## 2021-12-17 DIAGNOSIS — Z5112 Encounter for antineoplastic immunotherapy: Secondary | ICD-10-CM | POA: Insufficient documentation

## 2021-12-17 DIAGNOSIS — R803 Bence Jones proteinuria: Secondary | ICD-10-CM | POA: Diagnosis not present

## 2021-12-17 DIAGNOSIS — Z9049 Acquired absence of other specified parts of digestive tract: Secondary | ICD-10-CM | POA: Diagnosis not present

## 2021-12-17 DIAGNOSIS — D8989 Other specified disorders involving the immune mechanism, not elsewhere classified: Secondary | ICD-10-CM

## 2021-12-17 DIAGNOSIS — Z8042 Family history of malignant neoplasm of prostate: Secondary | ICD-10-CM | POA: Insufficient documentation

## 2021-12-17 DIAGNOSIS — Z7289 Other problems related to lifestyle: Secondary | ICD-10-CM | POA: Insufficient documentation

## 2021-12-17 DIAGNOSIS — D6959 Other secondary thrombocytopenia: Secondary | ICD-10-CM | POA: Diagnosis not present

## 2021-12-17 DIAGNOSIS — C9000 Multiple myeloma not having achieved remission: Secondary | ICD-10-CM | POA: Insufficient documentation

## 2021-12-17 DIAGNOSIS — E785 Hyperlipidemia, unspecified: Secondary | ICD-10-CM | POA: Diagnosis not present

## 2021-12-17 DIAGNOSIS — D696 Thrombocytopenia, unspecified: Secondary | ICD-10-CM | POA: Insufficient documentation

## 2021-12-17 DIAGNOSIS — Z885 Allergy status to narcotic agent status: Secondary | ICD-10-CM | POA: Insufficient documentation

## 2021-12-17 DIAGNOSIS — Z7189 Other specified counseling: Secondary | ICD-10-CM

## 2021-12-17 DIAGNOSIS — Z5111 Encounter for antineoplastic chemotherapy: Secondary | ICD-10-CM | POA: Diagnosis not present

## 2021-12-17 DIAGNOSIS — Z8616 Personal history of COVID-19: Secondary | ICD-10-CM | POA: Insufficient documentation

## 2021-12-17 DIAGNOSIS — D6481 Anemia due to antineoplastic chemotherapy: Secondary | ICD-10-CM | POA: Insufficient documentation

## 2021-12-17 DIAGNOSIS — Z9012 Acquired absence of left breast and nipple: Secondary | ICD-10-CM | POA: Insufficient documentation

## 2021-12-17 DIAGNOSIS — T451X5A Adverse effect of antineoplastic and immunosuppressive drugs, initial encounter: Secondary | ICD-10-CM | POA: Insufficient documentation

## 2021-12-17 LAB — CMP (CANCER CENTER ONLY)
ALT: 32 U/L (ref 0–44)
AST: 42 U/L — ABNORMAL HIGH (ref 15–41)
Albumin: 4.2 g/dL (ref 3.5–5.0)
Alkaline Phosphatase: 41 U/L (ref 38–126)
Anion gap: 4 — ABNORMAL LOW (ref 5–15)
BUN: 27 mg/dL — ABNORMAL HIGH (ref 8–23)
CO2: 29 mmol/L (ref 22–32)
Calcium: 8.7 mg/dL — ABNORMAL LOW (ref 8.9–10.3)
Chloride: 106 mmol/L (ref 98–111)
Creatinine: 1.17 mg/dL — ABNORMAL HIGH (ref 0.44–1.00)
GFR, Estimated: 50 mL/min — ABNORMAL LOW (ref >60)
Glucose, Bld: 101 mg/dL — ABNORMAL HIGH (ref 70–99)
Potassium: 3.3 mmol/L — ABNORMAL LOW (ref 3.5–5.1)
Sodium: 139 mmol/L (ref 135–145)
Total Bilirubin: 0.4 mg/dL (ref 0.3–1.2)
Total Protein: 6.2 g/dL — ABNORMAL LOW (ref 6.5–8.1)

## 2021-12-17 LAB — CBC WITH DIFFERENTIAL/PLATELET
Abs Immature Granulocytes: 0.01 10*3/uL (ref 0.00–0.07)
Basophils Absolute: 0 10*3/uL (ref 0.0–0.1)
Basophils Relative: 1 %
Eosinophils Absolute: 0.3 10*3/uL (ref 0.0–0.5)
Eosinophils Relative: 5 %
HCT: 27.8 % — ABNORMAL LOW (ref 36.0–46.0)
Hemoglobin: 9.5 g/dL — ABNORMAL LOW (ref 12.0–15.0)
Immature Granulocytes: 0 %
Lymphocytes Relative: 27 %
Lymphs Abs: 1.3 10*3/uL (ref 0.7–4.0)
MCH: 33 pg (ref 26.0–34.0)
MCHC: 34.2 g/dL (ref 30.0–36.0)
MCV: 96.5 fL (ref 80.0–100.0)
Monocytes Absolute: 0.6 10*3/uL (ref 0.1–1.0)
Monocytes Relative: 13 %
Neutro Abs: 2.6 10*3/uL (ref 1.7–7.7)
Neutrophils Relative %: 54 %
Platelets: 180 10*3/uL (ref 150–400)
RBC: 2.88 MIL/uL — ABNORMAL LOW (ref 3.87–5.11)
RDW: 14.6 % (ref 11.5–15.5)
WBC: 4.8 10*3/uL (ref 4.0–10.5)
nRBC: 0 % (ref 0.0–0.2)

## 2021-12-17 MED ORDER — ONDANSETRON HCL 8 MG PO TABS
8.0000 mg | ORAL_TABLET | Freq: Once | ORAL | Status: AC
  Administered 2021-12-17: 8 mg via ORAL
  Filled 2021-12-17: qty 1

## 2021-12-17 MED ORDER — ACETAMINOPHEN 325 MG PO TABS
650.0000 mg | ORAL_TABLET | Freq: Once | ORAL | Status: AC
  Administered 2021-12-17: 650 mg via ORAL

## 2021-12-17 MED ORDER — DEXAMETHASONE 4 MG PO TABS
8.0000 mg | ORAL_TABLET | Freq: Once | ORAL | Status: AC
  Administered 2021-12-17: 8 mg via ORAL

## 2021-12-17 MED ORDER — FAMOTIDINE 20 MG PO TABS
20.0000 mg | ORAL_TABLET | Freq: Once | ORAL | Status: AC
  Administered 2021-12-17: 20 mg via ORAL

## 2021-12-17 MED ORDER — DARATUMUMAB-HYALURONIDASE-FIHJ 1800-30000 MG-UT/15ML ~~LOC~~ SOLN
1800.0000 mg | Freq: Once | SUBCUTANEOUS | Status: AC
  Administered 2021-12-17: 1800 mg via SUBCUTANEOUS
  Filled 2021-12-17: qty 15

## 2021-12-17 MED ORDER — LORATADINE 10 MG PO TABS
10.0000 mg | ORAL_TABLET | Freq: Once | ORAL | Status: AC
  Administered 2021-12-17: 10 mg via ORAL

## 2021-12-17 MED ORDER — MONTELUKAST SODIUM 10 MG PO TABS
10.0000 mg | ORAL_TABLET | Freq: Once | ORAL | Status: AC
  Administered 2021-12-17: 10 mg via ORAL

## 2021-12-17 NOTE — Progress Notes (Signed)
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  HISTORY OF PRESENTING ILLNESS:  Please see previous note for details on initial presentation  INTERVAL HISTORY [name] is a 71 y.o. 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 She reports She is doing well with no new symptoms or concerns.  He reports that she has been feeling great recently.   We discussed she should get the flu shot when it comes out in October/September. She is agreeable to this.  She is eating and drinking well.  No prohibitive toxicities from Daratumumab at this time.  No new medications.  No cough. No nausea or vomiting. No palpitations. No symptoms suggestive of neuropathy at this time. No mouth sores. No bloody or black stools. No skin rashes. No new leg pain or swelling. No chest pain or shortness of breath. No other new or acute focal symptoms.  Labs done today reviewed in detail.  MEDICAL HISTORY:  Past Medical History:  Diagnosis Date   ASHD (arteriosclerotic heart disease)    Mild septal hypertrophy, 2D ECHO 11/2006, Dr. Percival Spanish   Breast cancer [facility])    PMH of, Dr. Marylene Buerger, Dr. Truddie Coco  multilpe sites overlaping, ER +   Facet syndrome, lumbar    Dr. Lynann Bologna   Heart murmur    no problems   History of COVID-19 05/07/2019   Hyperlipidemia    Hyperlipidemia    Hypertension    Osteopenia    Skin cancer, basal cell    Dr. Amy Martinique     SURGICAL HISTORY: Past Surgical History:  Procedure Laterality Date   CATARACT EXTRACTION Right 06/2020   CHOLECYSTECTOMY  about 1993   COLONOSCOPY      X 3  negative (last 2008, due 2015), Dr. Olevia Perches   MASTECTOMY Left 11/2004   Dr. Marylene Buerger, also reduction of right breast and reconstruction on the left.   ROTATOR CUFF REPAIR Left    TONSILLECTOMY     TRANSESOPHAGEAL ECHOCARDIOGRAM  2012   Overall left ventricular systolic function was normal. Left ventricular ejection fraction was estimated, range being 55% to 60%. There were no left ventricular regional wall motional abnormalities. Left ventricular wall thickness was mildly increased. There was mild focal basal septal hypertrophy.   WISDOM TOOTH EXTRACTION      SOCIAL HISTORY: Social History   Socioeconomic History   Marital status: Married    Spouse name: Not on file   Number of children: Not on file   Years of education: Not on file   Highest education level: Not on file  Occupational History   Occupation: Retired    Fish farm manager: UNEMPLOYED  Tobacco Use   Smoking status: Former    Packs/day: 1.00    Years: 10.00    Total pack years: 10.00    Types: Cigarettes    Quit date: 05/06/1978    Years since quitting: 43.6   Smokeless tobacco: Never   Tobacco comments:    smoked [place], up to 1 ppd  Vaping Use   Vaping Use: Never used  Substance and Sexual Activity   Alcohol use: Yes    Alcohol/week: 4.0 standard drinks of alcohol    Types:  4 Glasses of wine per week   Drug use: No   Sexual activity: Yes    Partners: Male    Birth control/protection: Post-menopausal  Other Topics Concern   Not on file  Social History Narrative   Married   Regular exercise: Yes, high level CVE, walks 2 mpd 5x/ week & gym   No specific diet   Social Determinants of Health   Financial Resource Strain: Not on file  Food Insecurity: Not on file  Transportation Needs: Not on file  Physical Activity: Not on file  Stress: Not on file  Social Connections: Not on file  Intimate Partner Violence: Not on file    FAMILY HISTORY: Family History  Problem Relation Age of Onset   Hypertension  Mother    Coronary artery disease Mother        no MI   Lung cancer Mother        smoker   Colon cancer Father 27   Coronary artery disease Father        no MI   Prostate cancer Father    Diabetes Father    Hypertension Sister    Cancer Maternal Grandfather        GI   Diabetes Paternal Grandfather    Stroke Neg Hx    Esophageal cancer Neg Hx    Stomach cancer Neg Hx    Rectal cancer Neg Hx     ALLERGIES:  is allergic to shrimp [shellfish allergy], hydrocodone, and vicodin [hydrocodone-acetaminophen].  MEDICATIONS:  Current Outpatient Medications  Medication Sig Dispense Refill   acyclovir (ZOVIRAX) 400 MG tablet Take 1 tablet (400 mg total) by mouth 2 (two) times daily. 30 tablet 6   amLODipine (NORVASC) 10 MG tablet Take 1 tablet (10 mg total) by mouth daily. 30 tablet 0   benazepril (LOTENSIN) 40 MG tablet Take 1 tablet (40 mg total) by mouth daily. 30 tablet 0   cyclophosphamide (CYTOXAN) 50 MG capsule Take 10 capsules (500 mg total) by mouth once a week. Take with food to minimize GI upset. Take early in the day and maintain hydration. (Bring to cancer center and take with Velcade injection after nausea medications). 40 capsule 2   denosumab (PROLIA) 60 MG/ML SOLN injection Inject 60 mg into the skin every 6 (six) months. Administer in upper arm, thigh, or abdomen     iron polysaccharides (NIFEREX) 150 MG capsule Take 1 capsule (150 mg total) by mouth daily. 30 capsule 2   LORazepam (ATIVAN) 0.5 MG tablet Take 1 tablet (0.5 mg total) by mouth every 6 (six) hours as needed (Nausea or vomiting). 30 tablet 0   ondansetron (ZOFRAN) 8 MG tablet Take 8 mg by mouth 30 to 60 min prior to Cytoxan administration then take 8 mg twice daily as needed for nausea and vomiting. 30 tablet 1   prochlorperazine (COMPAZINE) 10 MG tablet Take 1 tablet (10 mg total) by mouth every 6 (six) hours as needed (Nausea or vomiting). 30 tablet 1   rosuvastatin (CRESTOR) 20 MG tablet 1 tablet     No  current facility-administered medications for this visit.    REVIEW OF SYSTEMS:   10 Point review of Systems was done is negative except as noted above.  PHYSICAL EXAMINATION :  Vitals:   12/17/21 1233  BP: 116/71  Pulse: 73  Resp: 14  Temp: 97.9 F (36.6 C)  SpO2: 98%  NAD GENERAL:alert, in no acute distress and comfortable SKIN: no acute rashes, no significant lesions EYES:  conjunctiva are pink and non-injected, sclera anicteric NECK: supple, no JVD LYMPH:  no palpable lymphadenopathy in the cervical, axillary or inguinal regions LUNGS: clear to auscultation b/l with normal respiratory effort HEART: regular rate & rhythm ABDOMEN:  normoactive bowel sounds , non tender, not distended. Extremity: no pedal edema PSYCH: alert & oriented x 3 with fluent speech NEURO: no focal motor/sensory deficits  LABORATORY DATA:  I have reviewed the data as listed  .    Latest Ref Rng & Units 12/17/2021   12:02 PM 12/03/2021   11:18 AM 11/26/2021   10:34 AM  CBC  WBC 4.0 - 10.5 K/uL 4.8  3.3  2.7   Hemoglobin 12.0 - 15.0 g/dL 9.5  8.9  8.7   Hematocrit 36.0 - 46.0 % 27.8  26.2  25.2   Platelets 150 - 400 K/uL 180  181  198   .CBC    Component Value Date/Time   WBC 4.8 12/17/2021 1202   RBC 2.88 (L) 12/17/2021 1202   HGB 9.5 (L) 12/17/2021 1202   HGB 9.3 (L) 08/20/2021 1156   HCT 27.8 (L) 12/17/2021 1202   PLT 180 12/17/2021 1202   PLT 182 08/20/2021 1156   MCV 96.5 12/17/2021 1202   MCH 33.0 12/17/2021 1202   MCHC 34.2 12/17/2021 1202   RDW 14.6 12/17/2021 1202   LYMPHSABS 1.3 12/17/2021 1202   MONOABS 0.6 12/17/2021 1202   EOSABS 0.3 12/17/2021 1202   BASOSABS 0.0 12/17/2021 1202       Latest Ref Rng & Units 12/03/2021   11:18 AM 11/26/2021   10:34 AM 11/19/2021   11:23 AM  CMP  Glucose 70 - 99 mg/dL 99  94  78   BUN 8 - 23 mg/dL 27  24  21    Creatinine 0.44 - 1.00 mg/dL 1.17  1.09  1.00   Sodium 135 - 145 mmol/L 137  139  137   Potassium 3.5 - 5.1 mmol/L 3.5  3.5   3.3   Chloride 98 - 111 mmol/L 101  104  104   CO2 22 - 32 mmol/L 29  28  28    Calcium 8.9 - 10.3 mg/dL 9.1  8.8  8.8   Total Protein 6.5 - 8.1 g/dL 6.4  6.0  5.7   Total Bilirubin 0.3 - 1.2 mg/dL 0.4  0.3  0.4   Alkaline Phos 38 - 126 U/L 42  44  39   AST 15 - 41 U/L 39  36  34   ALT 0 - 44 U/L 40  34  28     Surgical Pathology  CASE: [id number]  PATIENT: Traci [name]  Bone Marrow Report    Clinical History: Multiple myeloma , Bence-jones proteinuria  (BH)   DIAGNOSIS:   BONE MARROW, ASPIRATE, CLOT, CORE:  -Normocellular bone marrow with plasma cell neoplasm  -See comment   PERIPHERAL BLOOD:  -Normocytic-normochromic anemia   COMMENT:   The bone marrow is generally normocellular for age with trilineage  hematopoiesis.  In this background, the plasma cells are increased in  number representing 12% of cells associated with small clusters in the  clot and biopsy sections.  The plasma cells display lambda light chain  restriction consistent with plasma cell neoplasm.  Correlation with  cytogenetic and FISH studies is recommended.     RADIOGRAPHIC STUDIES: I have personally reviewed the radiological images as listed and agreed with the findings in the report. No results found.  ASSESSMENT & PLAN:   71 year old female  with history of hypertrophic cardiomyopathy, hypertension, dyslipidemia with  #1  Recently diagnosed smoldering myeloma with renal LCDD Bone marrow biopsy with 12% lambda restricted plasma cells PET CT scan with no hypermetabolic bone lesions Cytogenetics normal female karyotype Molecular cytogenetics atypical t (11;14) Component     Latest Ref Rng & Units 01/30/2021  IgG (Immunoglobin G), Serum     586 - 1,602 mg/dL 610  IgA     64 - 422 mg/dL 91  IgM (Immunoglobulin M), Srm     26 - 217 mg/dL 79  Total Protein ELP     6.0 - 8.5 g/dL 6.8  Albumin SerPl Elph-Mcnc     2.9 - 4.4 g/dL 4.3  Alpha 1     0.0 - 0.4 g/dL 0.2  Alpha2 Glob SerPl  Elph-Mcnc     0.4 - 1.0 g/dL 0.7  B-Globulin SerPl Elph-Mcnc     0.7 - 1.3 g/dL 1.0  Gamma Glob SerPl Elph-Mcnc     0.4 - 1.8 g/dL 0.6  M Protein SerPl Elph-Mcnc     Not Observed g/dL Not Observed  Globulin, Total     2.2 - 3.9 g/dL 2.5  Albumin/Glob SerPl     0.7 - 1.7 1.8 (H)  IFE 1      Comment  Please Note (HCV):      Comment  Kappa free light chain     3.3 - 19.4 mg/L 11.8  Lambda free light chains     5.7 - 26.3 mg/L 480.8 (H)  Kappa, lambda light chain ratio     0.26 - 1.65 0.02 (L)   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  History of hypertension, dyslipidemia #5  Normocytic anemia with some worsening with hemoglobin down to 8.9 likely from Cytoxan.  Previous mild anemia likely related to chronic kidney disease and plasma cell dyscrasia.  Plan -Labs done today reviewed in detail. CBC show WBC count of 4.8k, platelets are stable at 180k and hemoglobin was 9.5. CMP stable. Creatinine elevated at 1.17 and GFR est. at 50. K/L light chain done 10/22/2021 shows normalized LFLC at 12.8 from 292.4 a month ago. K/L light chain ratio is 0.77 from 0.05 a month ago. -No prohibitive toxicities from Daratumumab CyBORD at this time. -She is clear to proceed with cycle-7 day-1 of treatment today. -Other than some anemia thrombocytopenia patient has no significant other toxicities from her CyBorD regimen at this time. -We will continue the same supportive medications -Treatment orders were reviewed and signed and treatment was coordinated with her nurses in infusion. -She is following with nephrology at [facility]. -Take Miralax 1x per day or as needed for constipation. -We discussed she should get the flu shot when it comes out in  October/September. -We will reduce Dexamethasone dosing from 75m to 8 mg  Follow-up Please schedule cycle 7, cycle 8 and cycle 9 of daratumumab infusions as per orders. Labs with each treatment. MD visit in 4 weeks with cycle 8-day 1  MD visit in 8 weeks with cycle 9 day 1  The total time spent in the appointment was 31 minutes*.  All of the patient's questions were answered with apparent satisfaction. The patient knows to call the clinic with any problems, questions or concerns.   GSullivan LoneMD MHackettstownAAHIVMS [facility]  [facility] Hematology/Oncology Physician [facility]  .*Total Encounter Time as defined by the Centers for Medicare and Medicaid Services includes, in addition to the face-to-face time of a patient visit (documented in the note above) non-face-to-face time: obtaining and reviewing outside history, ordering and reviewing medications, tests or procedures, care coordination (communications with other health care professionals or caregivers) and documentation in the medical record.  I, Melene Muller, am acting as scribe for Dr. Sullivan Lone, MD.  .I have reviewed the above documentation for accuracy and completeness, and I agree with the above.  Brunetta Genera MD

## 2021-12-17 NOTE — Patient Instructions (Addendum)
[facility] ONCOLOGY  Discharge Instructions: Thank you for choosing [facility] to provide your oncology and hematology care.   If you have a lab appointment with the [facility], please go directly to the [facility] and check in at the registration area.   Wear comfortable clothing and clothing appropriate for easy access to any Portacath or PICC line.   We strive to give you quality time with your provider. You may need to reschedule your appointment if you arrive late (15 or more minutes).  Arriving late affects you and other patients whose appointments are after yours.  Also, if you miss three or more appointments without notifying the office, you may be dismissed from the clinic at the provider's discretion.      For prescription refill requests, have your pharmacy contact our office and allow 72 hours for refills to be completed.    Today you received the following chemotherapy and/or immunotherapy agent: Daratumumab (Darzalex Faspro)   To help prevent nausea and vomiting after your treatment, we encourage you to take your nausea medication as directed.  BELOW ARE SYMPTOMS THAT SHOULD BE REPORTED IMMEDIATELY: *FEVER GREATER THAN 100.4 F (38 C) OR HIGHER *CHILLS OR SWEATING *NAUSEA AND VOMITING THAT IS NOT CONTROLLED WITH YOUR NAUSEA MEDICATION *UNUSUAL SHORTNESS OF BREATH *UNUSUAL BRUISING OR BLEEDING *URINARY PROBLEMS (pain or burning when urinating, or frequent urination) *BOWEL PROBLEMS (unusual diarrhea, constipation, pain near the anus) TENDERNESS IN MOUTH AND THROAT WITH OR WITHOUT PRESENCE OF ULCERS (sore throat, sores in mouth, or a toothache) UNUSUAL RASH, SWELLING OR PAIN  UNUSUAL VAGINAL DISCHARGE OR ITCHING   Items with * indicate a potential emergency and should be followed up as soon as possible or go to the Emergency Department if any problems should occur.  Please show the CHEMOTHERAPY ALERT CARD or IMMUNOTHERAPY ALERT CARD  at check-in to the Emergency Department and triage nurse.  Should you have questions after your visit or need to cancel or reschedule your appointment, please contact [facility]  Dept: [phone]  and follow the prompts.  Office hours are 8:00 a.m. to 4:30 p.m. Monday - Friday. Please note that voicemails left after 4:00 p.m. may not be returned until the following business day.  We are closed weekends and major holidays. You have access to a nurse at all times for urgent questions. Please call the main number to the clinic Dept: [phone] and follow the prompts.   For any non-urgent questions, you may also contact your provider using MyChart. We now offer e-Visits for anyone 74 and older to request care online for non-urgent symptoms. For details visit [email or web address].   Also download the MyChart app! Go to the app store, search "MyChart", open the app, select [facility], and log in with your MyChart username and password.  Masks are optional in the cancer centers. If you would like for your care team to wear a mask while they are taking care of you, please let them know. You may have one support person who is at least 73 years old accompany you for your appointments. Hypokalemia Hypokalemia means that the amount of potassium in the blood is lower than normal. Potassium is a mineral (electrolyte) that helps regulate the amount of fluid in the body. It also stimulates muscle tightening (contraction) and helps nerves work properly. Normally, most of the body's potassium is inside cells, and only a very small amount is in the blood. Because the amount  in the blood is so small, minor changes to potassium levels in the blood can be life-threatening. What are the causes? This condition may be caused by: Antibiotic medicine. Diarrhea or vomiting. Taking too much of a medicine that helps you have a bowel movement (laxative) can cause diarrhea and lead to  hypokalemia. Chronic kidney disease (CKD). Medicines that help the body get rid of excess fluid (diuretics). Eating disorders, such as anorexia or bulimia. Low magnesium levels in the body. Sweating a lot. What are the signs or symptoms? Symptoms of this condition include: Weakness. Constipation. Fatigue. Muscle cramps. Mental confusion. Skipped heartbeats or irregular heartbeat (palpitations). Tingling or numbness. How is this diagnosed? This condition is diagnosed with a blood test. How is this treated? This condition may be treated by: Taking potassium supplements. Adjusting the medicines that you take. Eating more foods that contain a lot of potassium. If your potassium level is very low, you may need to get potassium through an IV and be monitored in the hospital. Follow these instructions at home: Eating and drinking  Eat a healthy diet. A healthy diet includes fresh fruits and vegetables, whole grains, healthy fats, and lean proteins. If told, eat more foods that contain a lot of potassium. These include: Nuts, such as peanuts and pistachios. Seeds, such as sunflower seeds and pumpkin seeds. Peas, lentils, and lima beans. Whole grain and bran cereals and breads. Fresh fruits and vegetables, such as apricots, avocado, bananas, cantaloupe, kiwi, oranges, tomatoes, asparagus, and potatoes. Juices, such as orange, tomato, and prune. Lean meats, including fish. Milk and milk products, such as yogurt. General instructions Take over-the-counter and prescription medicines only as told by your health care provider. This includes vitamins, natural food products, and supplements. Keep all follow-up visits. This is important. Contact a health care provider if: You have weakness that gets worse. You feel your heart pounding or racing. You vomit. You have diarrhea. You have diabetes and you have trouble keeping your blood sugar in your target range. Get help right away if: You  have chest pain. You have shortness of breath. You have vomiting or diarrhea that lasts for more than 2 days. You faint. These symptoms may be an emergency. Get help right away. Call 911. Do not wait to see if the symptoms will go away. Do not drive yourself to the hospital. Summary Hypokalemia means that the amount of potassium in the blood is lower than normal. This condition is diagnosed with a blood test. Hypokalemia may be treated by taking potassium supplements, adjusting the medicines that you take, or eating more foods that are high in potassium. If your potassium level is very low, you may need to get potassium through an IV and be monitored in the hospital. This information is not intended to replace advice given to you by your health care provider. Make sure you discuss any questions you have with your health care provider. Document Revised: 01/04/2021 Document Reviewed: 01/04/2021 Elsevier Patient Education  [place].

## 2021-12-19 ENCOUNTER — Telehealth: Payer: Self-pay | Admitting: Hematology

## 2021-12-19 NOTE — Telephone Encounter (Addendum)
Unable to leave message with follow-up appointments per 8/14 los. Mailed calendar.

## 2021-12-20 ENCOUNTER — Other Ambulatory Visit: Payer: Self-pay

## 2021-12-20 ENCOUNTER — Telehealth: Payer: Self-pay | Admitting: Hematology

## 2021-12-20 NOTE — Telephone Encounter (Signed)
Left message with rescheduled upcoming appointment per patient's request.

## 2021-12-23 ENCOUNTER — Encounter: Payer: Self-pay | Admitting: Hematology

## 2021-12-31 ENCOUNTER — Inpatient Hospital Stay: Payer: Medicare Other

## 2021-12-31 ENCOUNTER — Other Ambulatory Visit: Payer: Self-pay

## 2021-12-31 VITALS — BP 147/82 | HR 61 | Temp 98.2°F | Resp 16 | Wt 117.5 lb

## 2021-12-31 DIAGNOSIS — R803 Bence Jones proteinuria: Secondary | ICD-10-CM | POA: Diagnosis not present

## 2021-12-31 DIAGNOSIS — C9000 Multiple myeloma not having achieved remission: Secondary | ICD-10-CM | POA: Diagnosis not present

## 2021-12-31 DIAGNOSIS — I422 Other hypertrophic cardiomyopathy: Secondary | ICD-10-CM | POA: Diagnosis not present

## 2021-12-31 DIAGNOSIS — D6959 Other secondary thrombocytopenia: Secondary | ICD-10-CM | POA: Diagnosis not present

## 2021-12-31 DIAGNOSIS — E785 Hyperlipidemia, unspecified: Secondary | ICD-10-CM | POA: Diagnosis not present

## 2021-12-31 DIAGNOSIS — D8989 Other specified disorders involving the immune mechanism, not elsewhere classified: Secondary | ICD-10-CM

## 2021-12-31 DIAGNOSIS — Z5112 Encounter for antineoplastic immunotherapy: Secondary | ICD-10-CM | POA: Diagnosis not present

## 2021-12-31 DIAGNOSIS — Z7189 Other specified counseling: Secondary | ICD-10-CM

## 2021-12-31 LAB — CBC WITH DIFFERENTIAL (CANCER CENTER ONLY)
Abs Immature Granulocytes: 0.01 10*3/uL (ref 0.00–0.07)
Basophils Absolute: 0 10*3/uL (ref 0.0–0.1)
Basophils Relative: 1 %
Eosinophils Absolute: 0.2 10*3/uL (ref 0.0–0.5)
Eosinophils Relative: 4 %
HCT: 30.9 % — ABNORMAL LOW (ref 36.0–46.0)
Hemoglobin: 10.4 g/dL — ABNORMAL LOW (ref 12.0–15.0)
Immature Granulocytes: 0 %
Lymphocytes Relative: 23 %
Lymphs Abs: 1.2 10*3/uL (ref 0.7–4.0)
MCH: 32.4 pg (ref 26.0–34.0)
MCHC: 33.7 g/dL (ref 30.0–36.0)
MCV: 96.3 fL (ref 80.0–100.0)
Monocytes Absolute: 0.5 10*3/uL (ref 0.1–1.0)
Monocytes Relative: 10 %
Neutro Abs: 3.2 10*3/uL (ref 1.7–7.7)
Neutrophils Relative %: 62 %
Platelet Count: 223 10*3/uL (ref 150–400)
RBC: 3.21 MIL/uL — ABNORMAL LOW (ref 3.87–5.11)
RDW: 14.3 % (ref 11.5–15.5)
WBC Count: 5.2 10*3/uL (ref 4.0–10.5)
nRBC: 0 % (ref 0.0–0.2)

## 2021-12-31 LAB — CMP (CANCER CENTER ONLY)
ALT: 28 U/L (ref 0–44)
AST: 34 U/L (ref 15–41)
Albumin: 4.4 g/dL (ref 3.5–5.0)
Alkaline Phosphatase: 40 U/L (ref 38–126)
Anion gap: 5 (ref 5–15)
BUN: 28 mg/dL — ABNORMAL HIGH (ref 8–23)
CO2: 30 mmol/L (ref 22–32)
Calcium: 9.9 mg/dL (ref 8.9–10.3)
Chloride: 106 mmol/L (ref 98–111)
Creatinine: 1.17 mg/dL — ABNORMAL HIGH (ref 0.44–1.00)
GFR, Estimated: 49 mL/min — ABNORMAL LOW (ref >60)
Glucose, Bld: 97 mg/dL (ref 70–99)
Potassium: 3.4 mmol/L — ABNORMAL LOW (ref 3.5–5.1)
Sodium: 141 mmol/L (ref 135–145)
Total Bilirubin: 0.4 mg/dL (ref 0.3–1.2)
Total Protein: 6.4 g/dL — ABNORMAL LOW (ref 6.5–8.1)

## 2021-12-31 MED ORDER — DEXAMETHASONE 4 MG PO TABS
8.0000 mg | ORAL_TABLET | Freq: Once | ORAL | Status: AC
  Administered 2021-12-31: 8 mg via ORAL
  Filled 2021-12-31: qty 2

## 2021-12-31 MED ORDER — ACETAMINOPHEN 325 MG PO TABS
650.0000 mg | ORAL_TABLET | Freq: Once | ORAL | Status: AC
  Administered 2021-12-31: 650 mg via ORAL
  Filled 2021-12-31: qty 2

## 2021-12-31 MED ORDER — MONTELUKAST SODIUM 10 MG PO TABS
10.0000 mg | ORAL_TABLET | Freq: Once | ORAL | Status: AC
  Administered 2021-12-31: 10 mg via ORAL
  Filled 2021-12-31: qty 1

## 2021-12-31 MED ORDER — ONDANSETRON HCL 8 MG PO TABS
8.0000 mg | ORAL_TABLET | Freq: Once | ORAL | Status: AC
  Administered 2021-12-31: 8 mg via ORAL
  Filled 2021-12-31: qty 1

## 2021-12-31 MED ORDER — LORATADINE 10 MG PO TABS
10.0000 mg | ORAL_TABLET | Freq: Once | ORAL | Status: AC
  Administered 2021-12-31: 10 mg via ORAL
  Filled 2021-12-31: qty 1

## 2021-12-31 MED ORDER — FAMOTIDINE 20 MG PO TABS
20.0000 mg | ORAL_TABLET | Freq: Once | ORAL | Status: AC
  Administered 2021-12-31: 20 mg via ORAL
  Filled 2021-12-31: qty 1

## 2021-12-31 MED ORDER — DARATUMUMAB-HYALURONIDASE-FIHJ 1800-30000 MG-UT/15ML ~~LOC~~ SOLN
1800.0000 mg | Freq: Once | SUBCUTANEOUS | Status: AC
  Administered 2021-12-31: 1800 mg via SUBCUTANEOUS
  Filled 2021-12-31: qty 15

## 2021-12-31 NOTE — Patient Instructions (Addendum)
[facility]COLOGY   Discharge Instructions: Thank you for choosing [facility] to provide your oncology and hematology care.   If you have a lab appointment with the Cancer Center, please go directly to the Cancer Center and check in at the registration area.   Wear comfortable clothing and clothing appropriate for easy access to any Portacath or PICC line.   We strive to give you quality time with your provider. You may need to reschedule your appointment if you arrive late (15 or more minutes).  Arriving late affects you and other patients whose appointments are after yours.  Also, if you miss three or more appointments without notifying the office, you may be dismissed from the clinic at the provider's discretion.      For prescription refill requests, have your pharmacy contact our office and allow 72 hours for refills to be completed.    Today you received the following chemotherapy and/or immunotherapy agents: Daratumumab hyaluronidase (Darzalex faspro)      To help prevent nausea and vomiting after your treatment, we encourage you to take your nausea medication as directed.  BELOW ARE SYMPTOMS THAT SHOULD BE REPORTED IMMEDIATELY: *FEVER GREATER THAN 100.4 F (38 C) OR HIGHER *CHILLS OR SWEATING *NAUSEA AND VOMITING THAT IS NOT CONTROLLED WITH YOUR NAUSEA MEDICATION *UNUSUAL SHORTNESS OF BREATH *UNUSUAL BRUISING OR BLEEDING *URINARY PROBLEMS (pain or burning when urinating, or frequent urination) *BOWEL PROBLEMS (unusual diarrhea, constipation, pain near the anus) TENDERNESS IN MOUTH AND THROAT WITH OR WITHOUT PRESENCE OF ULCERS (sore throat, sores in mouth, or a toothache) UNUSUAL RASH, SWELLING OR PAIN  UNUSUAL VAGINAL DISCHARGE OR ITCHING   Items with * indicate a potential emergency and should be followed up as soon as possible or go to the Emergency Department if any problems should occur.  Please show the CHEMOTHERAPY ALERT CARD or  IMMUNOTHERAPY ALERT CARD at check-in to the Emergency Department and triage nurse.  Should you have questions after your visit or need to cancel or reschedule your appointment, please contact [facility]COL[facility]  Dept: [phone]  and follow the prompts.  Office hours are 8:00 a.m. to 4:30 p.m. Monday - Friday. Please note that voicemails left after 4:00 p.m. may not be returned until the following business day.  We are closed weekends and major holidays. You have access to a nurse at all times for urgent questions. Please call the main number to the clinic Dept: [phone] and follow the prompts.   For any non-urgent questions, you may also contact your provider using MyChart. We now offer e-Visits for anyone 18 and older to request care online for non-urgent symptoms. For details visit [email or web address].   Also download the MyChart app! Go to the app store, search "MyChart", open the app, select [facility], and log in with your MyChart username and password.  Masks are optional in the cancer centers. If you would like for your care team to wear a mask while they are taking care of you, please let them know. You may have one support person who is at least 73 years old accompany you for your appointments. 

## 2022-01-04 ENCOUNTER — Other Ambulatory Visit: Payer: Self-pay

## 2022-01-09 ENCOUNTER — Other Ambulatory Visit: Payer: Self-pay | Admitting: Hematology

## 2022-01-14 ENCOUNTER — Other Ambulatory Visit: Payer: Medicare Other

## 2022-01-14 ENCOUNTER — Ambulatory Visit: Payer: Medicare Other

## 2022-01-14 ENCOUNTER — Ambulatory Visit: Payer: Medicare Other | Admitting: Hematology

## 2022-01-15 ENCOUNTER — Other Ambulatory Visit: Payer: Self-pay

## 2022-01-15 DIAGNOSIS — C9000 Multiple myeloma not having achieved remission: Secondary | ICD-10-CM

## 2022-01-16 ENCOUNTER — Inpatient Hospital Stay: Payer: Medicare Other

## 2022-01-16 ENCOUNTER — Other Ambulatory Visit: Payer: Self-pay

## 2022-01-16 ENCOUNTER — Inpatient Hospital Stay (HOSPITAL_BASED_OUTPATIENT_CLINIC_OR_DEPARTMENT_OTHER): Payer: Medicare Other | Admitting: Hematology

## 2022-01-16 ENCOUNTER — Inpatient Hospital Stay: Payer: Medicare Other | Attending: Hematology

## 2022-01-16 VITALS — BP 121/83 | HR 66 | Temp 98.1°F | Resp 16 | Wt 120.5 lb

## 2022-01-16 DIAGNOSIS — Z5111 Encounter for antineoplastic chemotherapy: Secondary | ICD-10-CM | POA: Diagnosis not present

## 2022-01-16 DIAGNOSIS — C9000 Multiple myeloma not having achieved remission: Secondary | ICD-10-CM | POA: Diagnosis not present

## 2022-01-16 DIAGNOSIS — Z5112 Encounter for antineoplastic immunotherapy: Secondary | ICD-10-CM | POA: Insufficient documentation

## 2022-01-16 DIAGNOSIS — D696 Thrombocytopenia, unspecified: Secondary | ICD-10-CM | POA: Insufficient documentation

## 2022-01-16 DIAGNOSIS — L814 Other melanin hyperpigmentation: Secondary | ICD-10-CM | POA: Diagnosis not present

## 2022-01-16 DIAGNOSIS — N189 Chronic kidney disease, unspecified: Secondary | ICD-10-CM | POA: Diagnosis not present

## 2022-01-16 DIAGNOSIS — I129 Hypertensive chronic kidney disease with stage 1 through stage 4 chronic kidney disease, or unspecified chronic kidney disease: Secondary | ICD-10-CM | POA: Diagnosis not present

## 2022-01-16 DIAGNOSIS — R803 Bence Jones proteinuria: Secondary | ICD-10-CM | POA: Diagnosis not present

## 2022-01-16 DIAGNOSIS — Z885 Allergy status to narcotic agent status: Secondary | ICD-10-CM | POA: Diagnosis not present

## 2022-01-16 DIAGNOSIS — Z79899 Other long term (current) drug therapy: Secondary | ICD-10-CM | POA: Insufficient documentation

## 2022-01-16 DIAGNOSIS — Z87891 Personal history of nicotine dependence: Secondary | ICD-10-CM | POA: Insufficient documentation

## 2022-01-16 DIAGNOSIS — D8989 Other specified disorders involving the immune mechanism, not elsewhere classified: Secondary | ICD-10-CM | POA: Diagnosis not present

## 2022-01-16 DIAGNOSIS — D649 Anemia, unspecified: Secondary | ICD-10-CM | POA: Diagnosis not present

## 2022-01-16 DIAGNOSIS — I422 Other hypertrophic cardiomyopathy: Secondary | ICD-10-CM | POA: Insufficient documentation

## 2022-01-16 DIAGNOSIS — Z85828 Personal history of other malignant neoplasm of skin: Secondary | ICD-10-CM | POA: Insufficient documentation

## 2022-01-16 DIAGNOSIS — E785 Hyperlipidemia, unspecified: Secondary | ICD-10-CM | POA: Insufficient documentation

## 2022-01-16 DIAGNOSIS — Z853 Personal history of malignant neoplasm of breast: Secondary | ICD-10-CM | POA: Diagnosis not present

## 2022-01-16 DIAGNOSIS — Z9049 Acquired absence of other specified parts of digestive tract: Secondary | ICD-10-CM | POA: Insufficient documentation

## 2022-01-16 DIAGNOSIS — D692 Other nonthrombocytopenic purpura: Secondary | ICD-10-CM | POA: Diagnosis not present

## 2022-01-16 DIAGNOSIS — Z8616 Personal history of COVID-19: Secondary | ICD-10-CM | POA: Insufficient documentation

## 2022-01-16 DIAGNOSIS — R252 Cramp and spasm: Secondary | ICD-10-CM | POA: Diagnosis not present

## 2022-01-16 DIAGNOSIS — L821 Other seborrheic keratosis: Secondary | ICD-10-CM | POA: Diagnosis not present

## 2022-01-16 DIAGNOSIS — Z9012 Acquired absence of left breast and nipple: Secondary | ICD-10-CM | POA: Insufficient documentation

## 2022-01-16 DIAGNOSIS — Z7189 Other specified counseling: Secondary | ICD-10-CM

## 2022-01-16 DIAGNOSIS — L570 Actinic keratosis: Secondary | ICD-10-CM | POA: Diagnosis not present

## 2022-01-16 LAB — CBC WITH DIFFERENTIAL/PLATELET
Abs Immature Granulocytes: 0.01 10*3/uL (ref 0.00–0.07)
Basophils Absolute: 0.1 10*3/uL (ref 0.0–0.1)
Basophils Relative: 1 %
Eosinophils Absolute: 0.1 10*3/uL (ref 0.0–0.5)
Eosinophils Relative: 3 %
HCT: 32 % — ABNORMAL LOW (ref 36.0–46.0)
Hemoglobin: 10.7 g/dL — ABNORMAL LOW (ref 12.0–15.0)
Immature Granulocytes: 0 %
Lymphocytes Relative: 24 %
Lymphs Abs: 1.1 10*3/uL (ref 0.7–4.0)
MCH: 32.8 pg (ref 26.0–34.0)
MCHC: 33.4 g/dL (ref 30.0–36.0)
MCV: 98.2 fL (ref 80.0–100.0)
Monocytes Absolute: 0.5 10*3/uL (ref 0.1–1.0)
Monocytes Relative: 11 %
Neutro Abs: 2.9 10*3/uL (ref 1.7–7.7)
Neutrophils Relative %: 61 %
Platelets: 192 10*3/uL (ref 150–400)
RBC: 3.26 MIL/uL — ABNORMAL LOW (ref 3.87–5.11)
RDW: 14 % (ref 11.5–15.5)
WBC: 4.7 10*3/uL (ref 4.0–10.5)
nRBC: 0 % (ref 0.0–0.2)

## 2022-01-16 LAB — CMP (CANCER CENTER ONLY)
ALT: 26 U/L (ref 0–44)
AST: 33 U/L (ref 15–41)
Albumin: 4.5 g/dL (ref 3.5–5.0)
Alkaline Phosphatase: 34 U/L — ABNORMAL LOW (ref 38–126)
Anion gap: 5 (ref 5–15)
BUN: 27 mg/dL — ABNORMAL HIGH (ref 8–23)
CO2: 31 mmol/L (ref 22–32)
Calcium: 9.7 mg/dL (ref 8.9–10.3)
Chloride: 106 mmol/L (ref 98–111)
Creatinine: 1.15 mg/dL — ABNORMAL HIGH (ref 0.44–1.00)
GFR, Estimated: 50 mL/min — ABNORMAL LOW (ref >60)
Glucose, Bld: 114 mg/dL — ABNORMAL HIGH (ref 70–99)
Potassium: 3.3 mmol/L — ABNORMAL LOW (ref 3.5–5.1)
Sodium: 142 mmol/L (ref 135–145)
Total Bilirubin: 0.5 mg/dL (ref 0.3–1.2)
Total Protein: 6.5 g/dL (ref 6.5–8.1)

## 2022-01-16 MED ORDER — DARATUMUMAB-HYALURONIDASE-FIHJ 1800-30000 MG-UT/15ML ~~LOC~~ SOLN
1800.0000 mg | Freq: Once | SUBCUTANEOUS | Status: AC
  Administered 2022-01-16: 1800 mg via SUBCUTANEOUS
  Filled 2022-01-16: qty 15

## 2022-01-16 MED ORDER — FAMOTIDINE 20 MG PO TABS
20.0000 mg | ORAL_TABLET | Freq: Once | ORAL | Status: AC
  Administered 2022-01-16: 20 mg via ORAL
  Filled 2022-01-16: qty 1

## 2022-01-16 MED ORDER — LORATADINE 10 MG PO TABS
10.0000 mg | ORAL_TABLET | Freq: Once | ORAL | Status: AC
  Administered 2022-01-16: 10 mg via ORAL
  Filled 2022-01-16: qty 1

## 2022-01-16 MED ORDER — MONTELUKAST SODIUM 10 MG PO TABS
10.0000 mg | ORAL_TABLET | Freq: Once | ORAL | Status: AC
  Administered 2022-01-16: 10 mg via ORAL
  Filled 2022-01-16: qty 1

## 2022-01-16 MED ORDER — DEXAMETHASONE 4 MG PO TABS
8.0000 mg | ORAL_TABLET | Freq: Once | ORAL | Status: AC
  Administered 2022-01-16: 8 mg via ORAL
  Filled 2022-01-16: qty 2

## 2022-01-16 MED ORDER — ONDANSETRON HCL 8 MG PO TABS
8.0000 mg | ORAL_TABLET | Freq: Once | ORAL | Status: AC
  Administered 2022-01-16: 8 mg via ORAL
  Filled 2022-01-16: qty 1

## 2022-01-16 MED ORDER — ACETAMINOPHEN 325 MG PO TABS
650.0000 mg | ORAL_TABLET | Freq: Once | ORAL | Status: AC
  Administered 2022-01-16: 650 mg via ORAL
  Filled 2022-01-16: qty 2

## 2022-01-16 NOTE — Patient Instructions (Addendum)
[facility] MEDICAL ONCOLOGY  Discharge Instructions: Thank you for choosing [facility] to provide your oncology and hematology care.   If you have a lab appointment with the Cancer Center, please go directly to the Cancer Center and check in at the registration area.   Wear comfortable clothing and clothing appropriate for easy access to any Portacath or PICC line.   We strive to give you quality time with your provider. You may need to reschedule your appointment if you arrive late (15 or more minutes).  Arriving late affects you and other patients whose appointments are after yours.  Also, if you miss three or more appointments without notifying the office, you may be dismissed from the clinic at the provider's discretion.      For prescription refill requests, have your pharmacy contact our office and allow 72 hours for refills to be completed.    Today you received the following chemotherapy and/or immunotherapy agents: Darzalex Faspro      To help prevent nausea and vomiting after your treatment, we encourage you to take your nausea medication as directed.  BELOW ARE SYMPTOMS THAT SHOULD BE REPORTED IMMEDIATELY: *FEVER GREATER THAN 100.4 F (38 C) OR HIGHER *CHILLS OR SWEATING *NAUSEA AND VOMITING THAT IS NOT CONTROLLED WITH YOUR NAUSEA MEDICATION *UNUSUAL SHORTNESS OF BREATH *UNUSUAL BRUISING OR BLEEDING *URINARY PROBLEMS (pain or burning when urinating, or frequent urination) *BOWEL PROBLEMS (unusual diarrhea, constipation, pain near the anus) TENDERNESS IN MOUTH AND THROAT WITH OR WITHOUT PRESENCE OF ULCERS (sore throat, sores in mouth, or a toothache) UNUSUAL RASH, SWELLING OR PAIN  UNUSUAL VAGINAL DISCHARGE OR ITCHING   Items with * indicate a potential emergency and should be followed up as soon as possible or go to the Emergency Department if any problems should occur.  Please show the CHEMOTHERAPY ALERT CARD or IMMUNOTHERAPY ALERT CARD at  check-in to the Emergency Department and triage nurse.  Should you have questions after your visit or need to cancel or reschedule your appointment, please contact [facility] MEDICAL [facility]COL[facility]  Dept: [phone]  and follow the prompts.  Office hours are 8:00 a.m. to 4:30 p.m. Monday - Friday. Please note that voicemails left after 4:00 p.m. may not be returned until the following business day.  We are closed weekends and major holidays. You have access to a nurse at all times for urgent questions. Please call the main number to the clinic Dept: [phone] and follow the prompts.   For any non-urgent questions, you may also contact your provider using MyChart. We now offer e-Visits for anyone 18 and older to request care online for non-urgent symptoms. For details visit [email or web address].   Also download the MyChart app! Go to the app store, search "MyChart", open the app, select [facility], and log in with your MyChart username and password.  Masks are optional in the cancer centers. If you would like for your care team to wear a mask while they are taking care of you, please let them know. You may have one support person who is at least 73 years old accompany you for your appointments. 

## 2022-01-17 ENCOUNTER — Ambulatory Visit (HOSPITAL_COMMUNITY): Payer: Medicare Other | Attending: Cardiology

## 2022-01-17 DIAGNOSIS — I3139 Other pericardial effusion (noninflammatory): Secondary | ICD-10-CM | POA: Insufficient documentation

## 2022-01-17 LAB — ECHOCARDIOGRAM COMPLETE
Area-P 1/2: 3.53 cm2
S' Lateral: 2.5 cm

## 2022-01-17 LAB — KAPPA/LAMBDA LIGHT CHAINS
Kappa free light chain: 7.1 mg/L (ref 3.3–19.4)
Kappa, lambda light chain ratio: 0.96 (ref 0.26–1.65)
Lambda free light chains: 7.4 mg/L (ref 5.7–26.3)

## 2022-01-22 NOTE — Progress Notes (Signed)
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  HISTORY OF PRESENTING ILLNESS:  Please see previous note for details on initial presentation  INTERVAL HISTORY Traci [name] is a 71 y.o. 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 She reports having had significant bodyaches and muscle cramps . She is not sure it was related to timming of last treatment.  She is eating and drinking well.  No prohibitive toxicities from Daratumumab at this time.  No new medications.  No cough. No nausea or vomiting. No palpitations. No symptoms suggestive of neuropathy at this time. No mouth sores. No bloody or black stools. No skin rashes. No new leg pain or swelling. No chest pain or shortness of breath. No other new or acute focal symptoms.  Labs done today reviewed in detail.  MEDICAL HISTORY:  Past Medical History:  Diagnosis Date   ASHD (arteriosclerotic heart disease)    Mild septal hypertrophy, 2D ECHO 11/2006, Dr. Percival Spanish   Breast cancer [facility])    PMH of, Dr. Marylene Buerger, Dr. Truddie Coco  multilpe sites overlaping, ER +   Facet syndrome, lumbar    Dr. Lynann Bologna   Heart murmur    no problems   History of COVID-19 05/07/2019   Hyperlipidemia    Hyperlipidemia    Hypertension    Osteopenia    Skin cancer, basal cell    Dr. Amy Martinique     SURGICAL HISTORY: Past Surgical History:  Procedure Laterality Date   CATARACT EXTRACTION Right 06/2020   CHOLECYSTECTOMY  about 1993   COLONOSCOPY      X 3 negative (last 2008, due 2015), Dr. Olevia Perches   MASTECTOMY Left 11/2004   Dr. Marylene Buerger, also reduction of  right breast and reconstruction on the left.   ROTATOR CUFF REPAIR Left    TONSILLECTOMY     TRANSESOPHAGEAL ECHOCARDIOGRAM  2012   Overall left ventricular systolic function was normal. Left ventricular ejection fraction was estimated, range being 55% to 60%. There were no left ventricular regional wall motional abnormalities. Left ventricular wall thickness was mildly increased. There was mild focal basal septal hypertrophy.   WISDOM TOOTH EXTRACTION      SOCIAL HISTORY: Social History   Socioeconomic History   Marital status: Married    Spouse name: Not on file   Number of children: Not on file   Years of education: Not on file   Highest education level: Not on file  Occupational History   Occupation: Retired    Fish farm manager: UNEMPLOYED  Tobacco Use   Smoking status: Former    Packs/day: 1.00    Years: 10.00    Total pack years: 10.00    Types: Cigarettes    Quit date: 05/06/1978    Years since quitting: 43.7   Smokeless tobacco: Never   Tobacco comments:    smoked [place], up to 1 ppd  Vaping Use   Vaping Use: Never used  Substance and Sexual Activity   Alcohol use: Yes    Alcohol/week: 4.0 standard drinks of alcohol    Types: 4 Glasses of wine per week   Drug use: No   Sexual activity: Yes    Partners: Male  Birth control/protection: Post-menopausal  Other Topics Concern   Not on file  Social History Narrative   Married   Regular exercise: Yes, high level CVE, walks 2 mpd 5x/ week & gym   No specific diet   Social Determinants of Health   Financial Resource Strain: Not on file  Food Insecurity: Not on file  Transportation Needs: Not on file  Physical Activity: Not on file  Stress: Not on file  Social Connections: Not on file  Intimate Partner Violence: Not on file    FAMILY HISTORY: Family History  Problem Relation Age of Onset   Hypertension Mother    Coronary artery disease Mother        no MI   Lung cancer Mother        smoker   Colon cancer  Father 46   Coronary artery disease Father        no MI   Prostate cancer Father    Diabetes Father    Hypertension Sister    Cancer Maternal Grandfather        GI   Diabetes Paternal Grandfather    Stroke Neg Hx    Esophageal cancer Neg Hx    Stomach cancer Neg Hx    Rectal cancer Neg Hx     ALLERGIES:  is allergic to shrimp [shellfish allergy], hydrocodone, and vicodin [hydrocodone-acetaminophen].  MEDICATIONS:  Current Outpatient Medications  Medication Sig Dispense Refill   acyclovir (ZOVIRAX) 400 MG tablet TAKE 1 TABLET BY MOUTH TWICE A DAY 180 tablet 1   amLODipine (NORVASC) 10 MG tablet Take 1 tablet (10 mg total) by mouth daily. 30 tablet 0   benazepril (LOTENSIN) 40 MG tablet Take 1 tablet (40 mg total) by mouth daily. 30 tablet 0   cyclophosphamide (CYTOXAN) 50 MG capsule Take 10 capsules (500 mg total) by mouth once a week. Take with food to minimize GI upset. Take early in the day and maintain hydration. (Bring to cancer center and take with Velcade injection after nausea medications). 40 capsule 2   denosumab (PROLIA) 60 MG/ML SOLN injection Inject 60 mg into the skin every 6 (six) months. Administer in upper arm, thigh, or abdomen     iron polysaccharides (NIFEREX) 150 MG capsule Take 1 capsule (150 mg total) by mouth daily. 30 capsule 2   LORazepam (ATIVAN) 0.5 MG tablet Take 1 tablet (0.5 mg total) by mouth every 6 (six) hours as needed (Nausea or vomiting). 30 tablet 0   ondansetron (ZOFRAN) 8 MG tablet Take 8 mg by mouth 30 to 60 min prior to Cytoxan administration then take 8 mg twice daily as needed for nausea and vomiting. 30 tablet 1   prochlorperazine (COMPAZINE) 10 MG tablet Take 1 tablet (10 mg total) by mouth every 6 (six) hours as needed (Nausea or vomiting). 30 tablet 1   rosuvastatin (CRESTOR) 20 MG tablet 1 tablet     No current facility-administered medications for this visit.    REVIEW OF SYSTEMS:   10 Point review of Systems was done is negative  except as noted above.  PHYSICAL EXAMINATION :  There were no vitals filed for this visit. NAD GENERAL:alert, in no acute distress and comfortable SKIN: no acute rashes, no significant lesions EYES: conjunctiva are pink and non-injected, sclera anicteric NECK: supple, no JVD LYMPH:  no palpable lymphadenopathy in the cervical, axillary or inguinal regions LUNGS: clear to auscultation b/l with normal respiratory effort HEART: regular rate & rhythm ABDOMEN:  normoactive bowel sounds , non  tender, not distended. Extremity: no pedal edema PSYCH: alert & oriented x 3 with fluent speech NEURO: no focal motor/sensory deficits  LABORATORY DATA:  I have reviewed the data as listed  .    Latest Ref Rng & Units 01/16/2022    1:17 PM 12/31/2021   10:54 AM 12/17/2021   12:02 PM  CBC  WBC 4.0 - 10.5 K/uL 4.7  5.2  4.8   Hemoglobin 12.0 - 15.0 g/dL 10.7  10.4  9.5   Hematocrit 36.0 - 46.0 % 32.0  30.9  27.8   Platelets 150 - 400 K/uL 192  223  180   .CBC    Component Value Date/Time   WBC 4.7 01/16/2022 1317   RBC 3.26 (L) 01/16/2022 1317   HGB 10.7 (L) 01/16/2022 1317   HGB 10.4 (L) 12/31/2021 1054   HCT 32.0 (L) 01/16/2022 1317   PLT 192 01/16/2022 1317   PLT 223 12/31/2021 1054   MCV 98.2 01/16/2022 1317   MCH 32.8 01/16/2022 1317   MCHC 33.4 01/16/2022 1317   RDW 14.0 01/16/2022 1317   LYMPHSABS 1.1 01/16/2022 1317   MONOABS 0.5 01/16/2022 1317   EOSABS 0.1 01/16/2022 1317   BASOSABS 0.1 01/16/2022 1317       Latest Ref Rng & Units 01/16/2022    1:17 PM 12/31/2021   10:54 AM 12/17/2021   12:02 PM  CMP  Glucose 70 - 99 mg/dL 114  97  101   BUN 8 - 23 mg/dL 27  28  27    Creatinine 0.44 - 1.00 mg/dL 1.15  1.17  1.17   Sodium 135 - 145 mmol/L 142  141  139   Potassium 3.5 - 5.1 mmol/L 3.3  3.4  3.3   Chloride 98 - 111 mmol/L 106  106  106   CO2 22 - 32 mmol/L 31  30  29    Calcium 8.9 - 10.3 mg/dL 9.7  9.9  8.7   Total Protein 6.5 - 8.1 g/dL 6.5  6.4  6.2   Total Bilirubin  0.3 - 1.2 mg/dL 0.5  0.4  0.4   Alkaline Phos 38 - 126 U/L 34  40  41   AST 15 - 41 U/L 33  34  42   ALT 0 - 44 U/L 26  28  32     Surgical Pathology  CASE: [id number]  PATIENT: Traci [name]  Bone Marrow Report    Clinical History: Multiple myeloma , Bence-jones proteinuria  (BH)   DIAGNOSIS:   BONE MARROW, ASPIRATE, CLOT, CORE:  -Normocellular bone marrow with plasma cell neoplasm  -See comment   PERIPHERAL BLOOD:  -Normocytic-normochromic anemia   COMMENT:   The bone marrow is generally normocellular for age with trilineage  hematopoiesis.  In this background, the plasma cells are increased in  number representing 12% of cells associated with small clusters in the  clot and biopsy sections.  The plasma cells display lambda light chain  restriction consistent with plasma cell neoplasm.  Correlation with  cytogenetic and FISH studies is recommended.     RADIOGRAPHIC STUDIES: I have personally reviewed the radiological images as listed and agreed with the findings in the report. ECHOCARDIOGRAM COMPLETE  Result Date: 01/17/2022    ECHOCARDIOGRAM REPORT   Patient Name:   Traci [name] Date of Exam: 01/17/2022 Medical Rec #:  [id number]     Height:       63.0 in Accession #:    [id number]    Weight:  120.5 lb Date of Birth:  05/11/1950     BSA:          1.559 m Patient Age:    30 years      BP:           116/71 mmHg Patient Gender: F             HR:           69 bpm. Exam Location:  [place] [facility] Procedure: 2D Echo, 3D Echo, Cardiac Doppler, Color Doppler and Strain Analysis Indications:    I31.39 Pericardial effusion  History:        Patient has prior history of Echocardiogram examinations, most                 recent 07/11/2021. Cardiomyopathy, Signs/Symptoms:Murmur; Risk                 Factors:Hypertension, Dyslipidemia, Family History of Coronary                 Artery Disease and Former Smoker. H/o breast cancer. Anemia.                 Multiple myeloma.  Sonographer:     Basilia Jumbo BS, RDCS Referring Phys: [id number]  Sonographer Comments: Image acquisition challenging due to breast implants. IMPRESSIONS  1. Left ventricular ejection fraction, by estimation, is 60 to 65%. The left ventricle has normal function. The left ventricle has no regional wall motion abnormalities. There is moderate left ventricular hypertrophy. Left ventricular diastolic parameters are consistent with Grade I diastolic dysfunction (impaired relaxation). The average left ventricular global longitudinal strain is -23.6 %. The global longitudinal strain is normal.  2. Right ventricular systolic function is normal. The right ventricular size is normal. There is normal pulmonary artery systolic pressure.  3. Left atrial size was severely dilated.  4. A small pericardial effusion is present.  5. The mitral valve is normal in structure. Mild mitral valve regurgitation. No evidence of mitral stenosis.  6. The aortic valve is tricuspid. Aortic valve regurgitation is not visualized. Aortic valve sclerosis/calcification is present, without any evidence of aortic stenosis.  7. The inferior vena cava is normal in size with greater than 50% respiratory variability, suggesting right atrial pressure of 3 mmHg. Comparison(s): 07/11/21 EF 60-65%. GLS -27.7%. FINDINGS  Left Ventricle: Left ventricular ejection fraction, by estimation, is 60 to 65%. The left ventricle has normal function. The left ventricle has no regional wall motion abnormalities. The average left ventricular global longitudinal strain is -23.6 %. The global longitudinal strain is normal. The left ventricular internal cavity size was normal in size. There is moderate left ventricular hypertrophy. Left ventricular diastolic parameters are consistent with Grade I diastolic dysfunction (impaired relaxation). Indeterminate filling pressures. Right Ventricle: The right ventricular size is normal. Right ventricular systolic function is normal. There is  normal pulmonary artery systolic pressure. The tricuspid regurgitant velocity is 2.39 m/s, and with an assumed right atrial pressure of 8 mmHg,  the estimated right ventricular systolic pressure is 47.4 mmHg. Left Atrium: Left atrial size was severely dilated. Right Atrium: Right atrial size was normal in size. Pericardium: A small pericardial effusion is present. Mitral Valve: The mitral valve is normal in structure. Mild mitral valve regurgitation. No evidence of mitral valve stenosis. Tricuspid Valve: The tricuspid valve is normal in structure. Tricuspid valve regurgitation is mild . No evidence of tricuspid stenosis. Aortic Valve: The aortic valve is tricuspid. Aortic valve regurgitation is not  visualized. Aortic valve sclerosis/calcification is present, without any evidence of aortic stenosis. Pulmonic Valve: The pulmonic valve was normal in structure. Pulmonic valve regurgitation is mild. No evidence of pulmonic stenosis. Aorta: The aortic root is normal in size and structure. Venous: The inferior vena cava is normal in size with greater than 50% respiratory variability, suggesting right atrial pressure of 3 mmHg. IAS/Shunts: No atrial level shunt detected by color flow Doppler.  LEFT VENTRICLE PLAX 2D LVIDd:         3.90 cm   Diastology LVIDs:         2.50 cm   LV e' medial:    6.53 cm/s LV PW:         1.40 cm   LV E/e' medial:  13.8 LV IVS:        1.20 cm   LV e' lateral:   9.53 cm/s LVOT diam:     2.40 cm   LV E/e' lateral: 9.5 LV SV:         153 LV SV Index:   98        2D Longitudinal Strain LVOT Area:     4.52 cm  2D Strain GLS (A2C):   -18.5 %                          2D Strain GLS (A3C):   -29.7 %                          2D Strain GLS (A4C):   -22.7 %                          2D Strain GLS Avg:     -23.6 %                           3D Volume EF:                          3D EF:        56 %                          LV EDV:       125 ml                          LV ESV:       55 ml                           LV SV:        70 ml RIGHT VENTRICLE             IVC RV Basal diam:  3.90 cm     IVC diam: 2.00 cm RV S prime:     13.60 cm/s TAPSE (M-mode): 3.4 cm RVSP:           30.8 mmHg LEFT ATRIUM             Index        RIGHT ATRIUM           Index LA diam:        4.30 cm 2.76 cm/m   RA Pressure: 8.00 mmHg LA Vol (A2C):   79.9 ml 51.25  ml/m  RA Area:     17.80 cm LA Vol (A4C):   63.9 ml 40.99 ml/m  RA Volume:   49.00 ml  31.43 ml/m LA Biplane Vol: 77.7 ml 49.84 ml/m  AORTIC VALVE LVOT Vmax:   143.00 cm/s LVOT Vmean:  99.700 cm/s LVOT VTI:    0.339 m  AORTA Ao Root diam: 3.10 cm Ao Asc diam:  3.70 cm MITRAL VALVE                TRICUSPID VALVE                             TR Peak grad:   22.8 mmHg MV Decel Time: 215 msec     TR Vmax:        239.00 cm/s MV E velocity: 90.10 cm/s   Estimated RAP:  8.00 mmHg MV A velocity: 118.00 cm/s  RVSP:           30.8 mmHg MV E/A ratio:  0.76                             SHUNTS                             Systemic VTI:  0.34 m                             Systemic Diam: 2.40 cm Kirk Ruths MD Electronically signed by Kirk Ruths MD Signature Date/Time: 01/17/2022/1:00:50 PM    Final     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 Bone marrow biopsy with 12% lambda restricted plasma cells PET CT scan with no hypermetabolic bone lesions Cytogenetics normal female karyotype Molecular cytogenetics atypical t (11;14) Component     Latest Ref Rng & Units 01/30/2021  IgG (Immunoglobin G), Serum     586 - 1,602 mg/dL 610  IgA     64 - 422 mg/dL 91  IgM (Immunoglobulin M), Srm     26 - 217 mg/dL 79  Total Protein ELP     6.0 - 8.5 g/dL 6.8  Albumin SerPl Elph-Mcnc     2.9 - 4.4 g/dL 4.3  Alpha 1     0.0 - 0.4 g/dL 0.2  Alpha2 Glob SerPl Elph-Mcnc     0.4 - 1.0 g/dL 0.7  B-Globulin SerPl Elph-Mcnc     0.7 - 1.3 g/dL 1.0  Gamma Glob  SerPl Elph-Mcnc     0.4 - 1.8 g/dL 0.6  M Protein SerPl Elph-Mcnc     Not Observed g/dL Not Observed  Globulin, Total     2.2 - 3.9 g/dL 2.5  Albumin/Glob SerPl     0.7 - 1.7 1.8 (H)  IFE 1      Comment  Please Note (HCV):      Comment  Kappa free light chain     3.3 - 19.4 mg/L 11.8  Lambda free light chains     5.7 - 26.3 mg/L 480.8 (H)  Kappa, lambda light chain ratio     0.26 - 1.65 0.02 (L)   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  History of hypertension, dyslipidemia #5  Normocytic anemia with some worsening with hemoglobin down to 8.9 likely from Cytoxan.  Previous mild anemia likely related to chronic kidney disease and plasma cell dyscrasia.  Plan -Labs done today reviewed in detail. CBC show WBC count of 4.7k, platelets are stable at 192k and hemoglobin was 10.7. CMP stable. Creatinine elevated at 1.15 and GFR est. at 50. K/L light chain shows normalized LFLC at 7.4 from 12.4 2 months ago. K/L light chain ratio is 0.96 from 0.86 2 months ago. Myeloma labs pending. -No prohibitive toxicities from Daratumumab at this time. -She is clear to proceed with cycle-8 day-1 of treatment today. -Other than some anemia thrombocytopenia patient has no significant other toxicities from her CyBorD regimen at this time. -We will continue the same supportive medications -Treatment orders were reviewed and signed and treatment was coordinated with her nurses in infusion. -She is following with nephrology at [facility]. -Take Miralax 1x per day or as needed for constipation. -We discussed she should get the flu shot when it comes out in October/September.as well as the new covid 19 vaccine. -We will reduce Dexamethasone dosing from 48m to 8  mg  Follow-up F/u as per currently scheduled appointments  The total time spent in the appointment was 30 minutes*.  All of the patient's questions were answered with apparent satisfaction. The patient knows to call the clinic with any problems, questions or concerns.   GSullivan LoneMD MS AAHIVMS SUniversity Orthopedics [facility] Surgery CenterCCincinnati Va Medical CenterHematology/Oncology Physician [facility] .*Total Encounter Time as defined by the Centers for Medicare and Medicaid Services includes, in addition to the face-to-face time of a patient visit (documented in the note above) non-face-to-face time: obtaining and reviewing outside history, ordering and reviewing medications, tests or procedures, care coordination (communications with other health care professionals or caregivers) and documentation in the medical record.  I, GMelene Muller am acting as scribe for Dr. GSullivan Lone MD.  .I have reviewed the above documentation for accuracy and completeness, and I agree with the above..Brunetta GeneraMD

## 2022-01-23 ENCOUNTER — Encounter: Payer: Self-pay | Admitting: Hematology

## 2022-01-23 DIAGNOSIS — R5383 Other fatigue: Secondary | ICD-10-CM | POA: Diagnosis not present

## 2022-01-23 DIAGNOSIS — E785 Hyperlipidemia, unspecified: Secondary | ICD-10-CM | POA: Diagnosis not present

## 2022-01-23 DIAGNOSIS — C9000 Multiple myeloma not having achieved remission: Secondary | ICD-10-CM | POA: Diagnosis not present

## 2022-01-23 DIAGNOSIS — I10 Essential (primary) hypertension: Secondary | ICD-10-CM | POA: Diagnosis not present

## 2022-01-23 DIAGNOSIS — M790 Rheumatism, unspecified: Secondary | ICD-10-CM | POA: Diagnosis not present

## 2022-01-23 DIAGNOSIS — D649 Anemia, unspecified: Secondary | ICD-10-CM | POA: Diagnosis not present

## 2022-01-23 DIAGNOSIS — N1831 Chronic kidney disease, stage 3a: Secondary | ICD-10-CM | POA: Diagnosis not present

## 2022-01-23 DIAGNOSIS — R7989 Other specified abnormal findings of blood chemistry: Secondary | ICD-10-CM | POA: Diagnosis not present

## 2022-01-23 LAB — MULTIPLE MYELOMA PANEL, SERUM
Albumin SerPl Elph-Mcnc: 3.9 g/dL (ref 2.9–4.4)
Albumin/Glob SerPl: 1.7 (ref 0.7–1.7)
Alpha 1: 0.2 g/dL (ref 0.0–0.4)
Alpha2 Glob SerPl Elph-Mcnc: 0.8 g/dL (ref 0.4–1.0)
B-Globulin SerPl Elph-Mcnc: 0.9 g/dL (ref 0.7–1.3)
Gamma Glob SerPl Elph-Mcnc: 0.5 g/dL (ref 0.4–1.8)
Globulin, Total: 2.4 g/dL (ref 2.2–3.9)
IgA: 22 mg/dL — ABNORMAL LOW (ref 64–422)
IgG (Immunoglobin G), Serum: 447 mg/dL — ABNORMAL LOW (ref 586–1602)
IgM (Immunoglobulin M), Srm: 35 mg/dL (ref 26–217)
M Protein SerPl Elph-Mcnc: 0.2 g/dL — ABNORMAL HIGH
Total Protein ELP: 6.3 g/dL (ref 6.0–8.5)

## 2022-01-28 ENCOUNTER — Inpatient Hospital Stay: Payer: Medicare Other

## 2022-01-28 ENCOUNTER — Other Ambulatory Visit: Payer: Self-pay

## 2022-01-28 VITALS — BP 128/84 | HR 63 | Temp 97.6°F | Resp 15 | Ht 63.0 in | Wt 122.1 lb

## 2022-01-28 DIAGNOSIS — I129 Hypertensive chronic kidney disease with stage 1 through stage 4 chronic kidney disease, or unspecified chronic kidney disease: Secondary | ICD-10-CM | POA: Diagnosis not present

## 2022-01-28 DIAGNOSIS — D8989 Other specified disorders involving the immune mechanism, not elsewhere classified: Secondary | ICD-10-CM

## 2022-01-28 DIAGNOSIS — R252 Cramp and spasm: Secondary | ICD-10-CM | POA: Diagnosis not present

## 2022-01-28 DIAGNOSIS — D649 Anemia, unspecified: Secondary | ICD-10-CM | POA: Diagnosis not present

## 2022-01-28 DIAGNOSIS — Z7189 Other specified counseling: Secondary | ICD-10-CM

## 2022-01-28 DIAGNOSIS — Z5112 Encounter for antineoplastic immunotherapy: Secondary | ICD-10-CM | POA: Diagnosis not present

## 2022-01-28 DIAGNOSIS — C9000 Multiple myeloma not having achieved remission: Secondary | ICD-10-CM | POA: Diagnosis not present

## 2022-01-28 DIAGNOSIS — R803 Bence Jones proteinuria: Secondary | ICD-10-CM | POA: Diagnosis not present

## 2022-01-28 LAB — CMP (CANCER CENTER ONLY)
ALT: 20 U/L (ref 0–44)
AST: 24 U/L (ref 15–41)
Albumin: 4.3 g/dL (ref 3.5–5.0)
Alkaline Phosphatase: 30 U/L — ABNORMAL LOW (ref 38–126)
Anion gap: 6 (ref 5–15)
BUN: 28 mg/dL — ABNORMAL HIGH (ref 8–23)
CO2: 27 mmol/L (ref 22–32)
Calcium: 9 mg/dL (ref 8.9–10.3)
Chloride: 105 mmol/L (ref 98–111)
Creatinine: 1.02 mg/dL — ABNORMAL HIGH (ref 0.44–1.00)
GFR, Estimated: 58 mL/min — ABNORMAL LOW (ref >60)
Glucose, Bld: 99 mg/dL (ref 70–99)
Potassium: 3.7 mmol/L (ref 3.5–5.1)
Sodium: 138 mmol/L (ref 135–145)
Total Bilirubin: 0.5 mg/dL (ref 0.3–1.2)
Total Protein: 6.6 g/dL (ref 6.5–8.1)

## 2022-01-28 LAB — CBC WITH DIFFERENTIAL/PLATELET
Abs Immature Granulocytes: 0.02 10*3/uL (ref 0.00–0.07)
Basophils Absolute: 0 10*3/uL (ref 0.0–0.1)
Basophils Relative: 1 %
Eosinophils Absolute: 0.2 10*3/uL (ref 0.0–0.5)
Eosinophils Relative: 3 %
HCT: 31 % — ABNORMAL LOW (ref 36.0–46.0)
Hemoglobin: 10.6 g/dL — ABNORMAL LOW (ref 12.0–15.0)
Immature Granulocytes: 0 %
Lymphocytes Relative: 19 %
Lymphs Abs: 1.2 10*3/uL (ref 0.7–4.0)
MCH: 33.2 pg (ref 26.0–34.0)
MCHC: 34.2 g/dL (ref 30.0–36.0)
MCV: 97.2 fL (ref 80.0–100.0)
Monocytes Absolute: 0.7 10*3/uL (ref 0.1–1.0)
Monocytes Relative: 11 %
Neutro Abs: 4.1 10*3/uL (ref 1.7–7.7)
Neutrophils Relative %: 66 %
Platelets: 208 10*3/uL (ref 150–400)
RBC: 3.19 MIL/uL — ABNORMAL LOW (ref 3.87–5.11)
RDW: 13.5 % (ref 11.5–15.5)
WBC: 6.2 10*3/uL (ref 4.0–10.5)
nRBC: 0 % (ref 0.0–0.2)

## 2022-01-28 MED ORDER — MONTELUKAST SODIUM 10 MG PO TABS
10.0000 mg | ORAL_TABLET | Freq: Once | ORAL | Status: AC
  Administered 2022-01-28: 10 mg via ORAL
  Filled 2022-01-28: qty 1

## 2022-01-28 MED ORDER — ACETAMINOPHEN 325 MG PO TABS
650.0000 mg | ORAL_TABLET | Freq: Once | ORAL | Status: AC
  Administered 2022-01-28: 650 mg via ORAL
  Filled 2022-01-28: qty 2

## 2022-01-28 MED ORDER — FAMOTIDINE 20 MG PO TABS
20.0000 mg | ORAL_TABLET | Freq: Once | ORAL | Status: AC
  Administered 2022-01-28: 20 mg via ORAL
  Filled 2022-01-28: qty 1

## 2022-01-28 MED ORDER — ONDANSETRON HCL 8 MG PO TABS
8.0000 mg | ORAL_TABLET | Freq: Once | ORAL | Status: AC
  Administered 2022-01-28: 8 mg via ORAL
  Filled 2022-01-28: qty 1

## 2022-01-28 MED ORDER — DARATUMUMAB-HYALURONIDASE-FIHJ 1800-30000 MG-UT/15ML ~~LOC~~ SOLN
1800.0000 mg | Freq: Once | SUBCUTANEOUS | Status: AC
  Administered 2022-01-28: 1800 mg via SUBCUTANEOUS
  Filled 2022-01-28: qty 15

## 2022-01-28 MED ORDER — DEXAMETHASONE 4 MG PO TABS
8.0000 mg | ORAL_TABLET | Freq: Once | ORAL | Status: AC
  Administered 2022-01-28: 8 mg via ORAL
  Filled 2022-01-28: qty 2

## 2022-01-28 MED ORDER — LORATADINE 10 MG PO TABS
10.0000 mg | ORAL_TABLET | Freq: Once | ORAL | Status: AC
  Administered 2022-01-28: 10 mg via ORAL
  Filled 2022-01-28: qty 1

## 2022-01-28 NOTE — Patient Instructions (Signed)
[facility] MEDICAL ONCOLOGY  Discharge Instructions: Thank you for choosing [facility] to provide your oncology and hematology care.   If you have a lab appointment with the Cancer Center, please go directly to the Cancer Center and check in at the registration area.   Wear comfortable clothing and clothing appropriate for easy access to any Portacath or PICC line.   We strive to give you quality time with your provider. You may need to reschedule your appointment if you arrive late (15 or more minutes).  Arriving late affects you and other patients whose appointments are after yours.  Also, if you miss three or more appointments without notifying the office, you may be dismissed from the clinic at the provider's discretion.      For prescription refill requests, have your pharmacy contact our office and allow 72 hours for refills to be completed.    Today you received the following chemotherapy and/or immunotherapy agents: Dara Faspro.      To help prevent nausea and vomiting after your treatment, we encourage you to take your nausea medication as directed.  BELOW ARE SYMPTOMS THAT SHOULD BE REPORTED IMMEDIATELY: *FEVER GREATER THAN 100.4 F (38 C) OR HIGHER *CHILLS OR SWEATING *NAUSEA AND VOMITING THAT IS NOT CONTROLLED WITH YOUR NAUSEA MEDICATION *UNUSUAL SHORTNESS OF BREATH *UNUSUAL BRUISING OR BLEEDING *URINARY PROBLEMS (pain or burning when urinating, or frequent urination) *BOWEL PROBLEMS (unusual diarrhea, constipation, pain near the anus) TENDERNESS IN MOUTH AND THROAT WITH OR WITHOUT PRESENCE OF ULCERS (sore throat, sores in mouth, or a toothache) UNUSUAL RASH, SWELLING OR PAIN  UNUSUAL VAGINAL DISCHARGE OR ITCHING   Items with * indicate a potential emergency and should be followed up as soon as possible or go to the Emergency Department if any problems should occur.  Please show the CHEMOTHERAPY ALERT CARD or IMMUNOTHERAPY ALERT CARD at check-in  to the Emergency Department and triage nurse.  Should you have questions after your visit or need to cancel or reschedule your appointment, please contact [facility]  Dept: [phone]  and follow the prompts.  Office hours are 8:00 a.m. to 4:30 p.m. Monday - Friday. Please note that voicemails left after 4:00 p.m. may not be returned until the following business day.  We are closed weekends and major holidays. You have access to a nurse at all times for urgent questions. Please call the main number to the clinic Dept: [phone] and follow the prompts.   For any non-urgent questions, you may also contact your provider using MyChart. We now offer e-Visits for anyone 18 and older to request care online for non-urgent symptoms. For details visit [email or web address].   Also download the MyChart app! Go to the app store, search "MyChart", open the app, select [facility], and log in with your MyChart username and password.  Masks are optional in the cancer centers. If you would like for your care team to wear a mask while they are taking care of you, please let them know. You may have one support person who is at least 73 years old accompany you for your appointments. 

## 2022-02-08 ENCOUNTER — Other Ambulatory Visit: Payer: Self-pay

## 2022-02-08 DIAGNOSIS — C9000 Multiple myeloma not having achieved remission: Secondary | ICD-10-CM

## 2022-02-11 ENCOUNTER — Inpatient Hospital Stay (HOSPITAL_BASED_OUTPATIENT_CLINIC_OR_DEPARTMENT_OTHER): Payer: Medicare Other | Admitting: Hematology

## 2022-02-11 ENCOUNTER — Inpatient Hospital Stay: Payer: Medicare Other | Attending: Hematology

## 2022-02-11 ENCOUNTER — Inpatient Hospital Stay: Payer: Medicare Other

## 2022-02-11 ENCOUNTER — Other Ambulatory Visit: Payer: Self-pay

## 2022-02-11 VITALS — BP 139/83 | HR 75 | Temp 97.8°F | Resp 16 | Ht 63.0 in | Wt 121.4 lb

## 2022-02-11 DIAGNOSIS — Z5112 Encounter for antineoplastic immunotherapy: Secondary | ICD-10-CM | POA: Insufficient documentation

## 2022-02-11 DIAGNOSIS — Z87891 Personal history of nicotine dependence: Secondary | ICD-10-CM | POA: Diagnosis not present

## 2022-02-11 DIAGNOSIS — Z79899 Other long term (current) drug therapy: Secondary | ICD-10-CM | POA: Diagnosis not present

## 2022-02-11 DIAGNOSIS — Z7289 Other problems related to lifestyle: Secondary | ICD-10-CM | POA: Diagnosis not present

## 2022-02-11 DIAGNOSIS — D8989 Other specified disorders involving the immune mechanism, not elsewhere classified: Secondary | ICD-10-CM

## 2022-02-11 DIAGNOSIS — M810 Age-related osteoporosis without current pathological fracture: Secondary | ICD-10-CM | POA: Diagnosis not present

## 2022-02-11 DIAGNOSIS — E273 Drug-induced adrenocortical insufficiency: Secondary | ICD-10-CM | POA: Diagnosis not present

## 2022-02-11 DIAGNOSIS — M791 Myalgia, unspecified site: Secondary | ICD-10-CM | POA: Diagnosis not present

## 2022-02-11 DIAGNOSIS — Z853 Personal history of malignant neoplasm of breast: Secondary | ICD-10-CM | POA: Insufficient documentation

## 2022-02-11 DIAGNOSIS — Z8042 Family history of malignant neoplasm of prostate: Secondary | ICD-10-CM | POA: Insufficient documentation

## 2022-02-11 DIAGNOSIS — C9000 Multiple myeloma not having achieved remission: Secondary | ICD-10-CM | POA: Diagnosis not present

## 2022-02-11 DIAGNOSIS — Z800 Family history of malignant neoplasm of digestive organs: Secondary | ICD-10-CM | POA: Diagnosis not present

## 2022-02-11 DIAGNOSIS — Z833 Family history of diabetes mellitus: Secondary | ICD-10-CM | POA: Insufficient documentation

## 2022-02-11 DIAGNOSIS — Z8616 Personal history of COVID-19: Secondary | ICD-10-CM | POA: Insufficient documentation

## 2022-02-11 DIAGNOSIS — Z5111 Encounter for antineoplastic chemotherapy: Secondary | ICD-10-CM | POA: Diagnosis not present

## 2022-02-11 DIAGNOSIS — R803 Bence Jones proteinuria: Secondary | ICD-10-CM | POA: Diagnosis not present

## 2022-02-11 DIAGNOSIS — N1831 Chronic kidney disease, stage 3a: Secondary | ICD-10-CM | POA: Diagnosis not present

## 2022-02-11 DIAGNOSIS — Z7189 Other specified counseling: Secondary | ICD-10-CM

## 2022-02-11 DIAGNOSIS — Z801 Family history of malignant neoplasm of trachea, bronchus and lung: Secondary | ICD-10-CM | POA: Diagnosis not present

## 2022-02-11 DIAGNOSIS — Z8249 Family history of ischemic heart disease and other diseases of the circulatory system: Secondary | ICD-10-CM | POA: Insufficient documentation

## 2022-02-11 DIAGNOSIS — I10 Essential (primary) hypertension: Secondary | ICD-10-CM | POA: Diagnosis not present

## 2022-02-11 DIAGNOSIS — R7301 Impaired fasting glucose: Secondary | ICD-10-CM | POA: Diagnosis not present

## 2022-02-11 DIAGNOSIS — D649 Anemia, unspecified: Secondary | ICD-10-CM | POA: Diagnosis not present

## 2022-02-11 LAB — CBC WITH DIFFERENTIAL/PLATELET
Abs Immature Granulocytes: 0.02 10*3/uL (ref 0.00–0.07)
Basophils Absolute: 0 10*3/uL (ref 0.0–0.1)
Basophils Relative: 1 %
Eosinophils Absolute: 0.2 10*3/uL (ref 0.0–0.5)
Eosinophils Relative: 3 %
HCT: 33.2 % — ABNORMAL LOW (ref 36.0–46.0)
Hemoglobin: 11.2 g/dL — ABNORMAL LOW (ref 12.0–15.0)
Immature Granulocytes: 0 %
Lymphocytes Relative: 17 %
Lymphs Abs: 0.9 10*3/uL (ref 0.7–4.0)
MCH: 32.3 pg (ref 26.0–34.0)
MCHC: 33.7 g/dL (ref 30.0–36.0)
MCV: 95.7 fL (ref 80.0–100.0)
Monocytes Absolute: 0.5 10*3/uL (ref 0.1–1.0)
Monocytes Relative: 10 %
Neutro Abs: 3.6 10*3/uL (ref 1.7–7.7)
Neutrophils Relative %: 69 %
Platelets: 198 10*3/uL (ref 150–400)
RBC: 3.47 MIL/uL — ABNORMAL LOW (ref 3.87–5.11)
RDW: 13.3 % (ref 11.5–15.5)
WBC: 5.2 10*3/uL (ref 4.0–10.5)
nRBC: 0 % (ref 0.0–0.2)

## 2022-02-11 LAB — CMP (CANCER CENTER ONLY)
ALT: 22 U/L (ref 0–44)
AST: 28 U/L (ref 15–41)
Albumin: 4.4 g/dL (ref 3.5–5.0)
Alkaline Phosphatase: 38 U/L (ref 38–126)
Anion gap: 6 (ref 5–15)
BUN: 25 mg/dL — ABNORMAL HIGH (ref 8–23)
CO2: 29 mmol/L (ref 22–32)
Calcium: 9.5 mg/dL (ref 8.9–10.3)
Chloride: 104 mmol/L (ref 98–111)
Creatinine: 1.28 mg/dL — ABNORMAL HIGH (ref 0.44–1.00)
GFR, Estimated: 44 mL/min — ABNORMAL LOW (ref >60)
Glucose, Bld: 105 mg/dL — ABNORMAL HIGH (ref 70–99)
Potassium: 3.1 mmol/L — ABNORMAL LOW (ref 3.5–5.1)
Sodium: 139 mmol/L (ref 135–145)
Total Bilirubin: 0.4 mg/dL (ref 0.3–1.2)
Total Protein: 6.8 g/dL (ref 6.5–8.1)

## 2022-02-11 MED ORDER — ACETAMINOPHEN 325 MG PO TABS
650.0000 mg | ORAL_TABLET | Freq: Once | ORAL | Status: AC
  Administered 2022-02-11: 650 mg via ORAL
  Filled 2022-02-11: qty 2

## 2022-02-11 MED ORDER — ONDANSETRON HCL 8 MG PO TABS
8.0000 mg | ORAL_TABLET | Freq: Once | ORAL | Status: AC
  Administered 2022-02-11: 8 mg via ORAL
  Filled 2022-02-11: qty 1

## 2022-02-11 MED ORDER — DULOXETINE HCL 30 MG PO CPEP: 30.0000 mg | ORAL_CAPSULE | Freq: Every day | ORAL | 3 refills | Status: DC

## 2022-02-11 MED ORDER — B COMPLEX VITAMINS PO CAPS: 1.0000 | ORAL_CAPSULE | Freq: Every day | ORAL | 4 refills | Status: DC

## 2022-02-11 MED ORDER — VITAMIN D 25 MCG (1000 UNIT) PO TABS: 2000.0000 [IU] | ORAL_TABLET | Freq: Every day | ORAL | 11 refills | Status: AC

## 2022-02-11 MED ORDER — FAMOTIDINE 20 MG PO TABS
20.0000 mg | ORAL_TABLET | Freq: Once | ORAL | Status: AC
  Administered 2022-02-11: 20 mg via ORAL
  Filled 2022-02-11: qty 1

## 2022-02-11 MED ORDER — LORATADINE 10 MG PO TABS
10.0000 mg | ORAL_TABLET | Freq: Once | ORAL | Status: AC
  Administered 2022-02-11: 10 mg via ORAL
  Filled 2022-02-11: qty 1

## 2022-02-11 MED ORDER — DARATUMUMAB-HYALURONIDASE-FIHJ 1800-30000 MG-UT/15ML ~~LOC~~ SOLN
1800.0000 mg | Freq: Once | SUBCUTANEOUS | Status: AC
  Administered 2022-02-11: 1800 mg via SUBCUTANEOUS
  Filled 2022-02-11: qty 15

## 2022-02-11 MED ORDER — MONTELUKAST SODIUM 10 MG PO TABS
10.0000 mg | ORAL_TABLET | Freq: Once | ORAL | Status: AC
  Administered 2022-02-11: 10 mg via ORAL
  Filled 2022-02-11: qty 1

## 2022-02-11 MED ORDER — POLYSACCHARIDE IRON COMPLEX 150 MG PO CAPS: 150.0000 mg | ORAL_CAPSULE | Freq: Every day | ORAL | 2 refills | Status: AC

## 2022-02-11 MED ORDER — DEXAMETHASONE 4 MG PO TABS
8.0000 mg | ORAL_TABLET | Freq: Once | ORAL | Status: AC
  Administered 2022-02-11: 8 mg via ORAL
  Filled 2022-02-11: qty 2

## 2022-02-11 NOTE — Progress Notes (Signed)
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  HISTORY OF PRESENTING ILLNESS:  Please see previous note for details on initial presentation  INTERVAL HISTORY  [name] is a 71 y.o. 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   She was doing well during her last visit on 01/16/2022.  She complains of constant diffuse body aches with movement for a month. She denies of muscle pain with palpation. She denies abdominal pain. She tried tylenol for body aches, but it did not help. She reports that her pain does not cause problem with her sleep, but notices pain before going to sleep. She denies of any gouts. She reports her PCP recently checked her cortisol levels which were low, which might be due to the steroids she is receiving. Her PCP prescribed her with Hydrocortisone 10 mg. She reports she took Hydrocortisone 10 MG for one week and reports it only helped for a while.   She is regularly taking Vitamin D-3 and Iron supplement. She is also regularly taking Crestor to manage her cholesterol.   She reports she is on Prolia for osteoporosis.  She has a good appetite.   MEDICAL HISTORY:  Past Medical History:  Diagnosis Date   ASHD (arteriosclerotic heart disease)    Mild septal hypertrophy, 2D ECHO 11/2006, Dr. Percival Spanish   Breast cancer [facility])    PMH of, Dr. Marylene Buerger, Dr. Truddie Coco  multilpe sites overlaping, ER +   Facet syndrome, lumbar    Dr. Lynann Bologna   Heart murmur    no problems   History of COVID-19 05/07/2019   Hyperlipidemia    Hyperlipidemia    Hypertension    Osteopenia    Skin cancer, basal  cell    Dr. Amy Martinique     SURGICAL HISTORY: Past Surgical History:  Procedure Laterality Date   CATARACT EXTRACTION Right 06/2020   CHOLECYSTECTOMY  about 1993   COLONOSCOPY      X 3 negative (last 2008, due 2015), Dr. Olevia Perches   MASTECTOMY Left 11/2004   Dr. Marylene Buerger, also reduction of right breast and reconstruction on the left.   ROTATOR CUFF REPAIR Left    TONSILLECTOMY     TRANSESOPHAGEAL ECHOCARDIOGRAM  2012   Overall left ventricular systolic function was normal. Left ventricular ejection fraction was estimated, range being 55% to 60%. There were no left ventricular regional wall motional abnormalities. Left ventricular wall thickness was mildly increased. There was mild focal basal septal hypertrophy.   WISDOM TOOTH EXTRACTION      SOCIAL HISTORY: Social History   Socioeconomic History   Marital status: Married    Spouse name: Not on file   Number of children: Not on file   Years of education: Not on file   Highest education level: Not on file  Occupational History   Occupation: Retired    Fish farm manager: UNEMPLOYED  Tobacco Use   Smoking status: Former    Packs/day: 1.00    Years: 10.00    Total pack years: 10.00    Types: Cigarettes    Quit date: 05/06/1978    Years since quitting: 43.8   Smokeless tobacco: Never   Tobacco comments:  smoked [place], up to 1 ppd  Vaping Use   Vaping Use: Never used  Substance and Sexual Activity   Alcohol use: Yes    Alcohol/week: 4.0 standard drinks of alcohol    Types: 4 Glasses of wine per week   Drug use: No   Sexual activity: Yes    Partners: Male    Birth control/protection: Post-menopausal  Other Topics Concern   Not on file  Social History Narrative   Married   Regular exercise: Yes, high level CVE, walks 2 mpd 5x/ week & gym   No specific diet   Social Determinants of Health   Financial Resource Strain: Not on file  Food Insecurity: Not on file  Transportation Needs: Not on file  Physical Activity:  Not on file  Stress: Not on file  Social Connections: Not on file  Intimate Partner Violence: Not on file    FAMILY HISTORY: Family History  Problem Relation Age of Onset   Hypertension Mother    Coronary artery disease Mother        no MI   Lung cancer Mother        smoker   Colon cancer Father 50   Coronary artery disease Father        no MI   Prostate cancer Father    Diabetes Father    Hypertension Sister    Cancer Maternal Grandfather        GI   Diabetes Paternal Grandfather    Stroke Neg Hx    Esophageal cancer Neg Hx    Stomach cancer Neg Hx    Rectal cancer Neg Hx     ALLERGIES:  is allergic to shrimp [shellfish allergy], hydrocodone, and vicodin [hydrocodone-acetaminophen].  MEDICATIONS:  Current Outpatient Medications  Medication Sig Dispense Refill   acyclovir (ZOVIRAX) 400 MG tablet TAKE 1 TABLET BY MOUTH TWICE A DAY 180 tablet 1   amLODipine (NORVASC) 10 MG tablet Take 1 tablet (10 mg total) by mouth daily. 30 tablet 0   benazepril (LOTENSIN) 40 MG tablet Take 1 tablet (40 mg total) by mouth daily. 30 tablet 0   cyclophosphamide (CYTOXAN) 50 MG capsule Take 10 capsules (500 mg total) by mouth once a week. Take with food to minimize GI upset. Take early in the day and maintain hydration. (Bring to cancer center and take with Velcade injection after nausea medications). 40 capsule 2   denosumab (PROLIA) 60 MG/ML SOLN injection Inject 60 mg into the skin every 6 (six) months. Administer in upper arm, thigh, or abdomen     iron polysaccharides (NIFEREX) 150 MG capsule Take 1 capsule (150 mg total) by mouth daily. 30 capsule 2   LORazepam (ATIVAN) 0.5 MG tablet Take 1 tablet (0.5 mg total) by mouth every 6 (six) hours as needed (Nausea or vomiting). 30 tablet 0   ondansetron (ZOFRAN) 8 MG tablet Take 8 mg by mouth 30 to 60 min prior to Cytoxan administration then take 8 mg twice daily as needed for nausea and vomiting. 30 tablet 1   prochlorperazine (COMPAZINE) 10  MG tablet Take 1 tablet (10 mg total) by mouth every 6 (six) hours as needed (Nausea or vomiting). 30 tablet 1   rosuvastatin (CRESTOR) 20 MG tablet 1 tablet     No current facility-administered medications for this visit.    REVIEW OF SYSTEMS:   10 Point review of Systems was done is negative except as noted above.  PHYSICAL EXAMINATION :  Vitals:   02/11/22 1142  BP: 139/83  Pulse: 75  Resp: 16  Temp: 97.8 F (36.6 C)  SpO2: 98%   NAD GENERAL:alert, in no acute distress and comfortable SKIN: no acute rashes, no significant lesions EYES: conjunctiva are pink and non-injected, sclera anicteric NECK: supple, no JVD LYMPH:  no palpable lymphadenopathy in the cervical, axillary or inguinal regions LUNGS: clear to auscultation b/l with normal respiratory effort HEART: regular rate & rhythm ABDOMEN:  normoactive bowel sounds , non tender, not distended. Extremity: no pedal edema PSYCH: alert & oriented x 3 with fluent speech NEURO: no focal motor/sensory deficits  LABORATORY DATA:  I have reviewed the data as listed  .    Latest Ref Rng & Units 02/11/2022   11:29 AM 01/28/2022   10:43 AM 01/16/2022    1:17 PM  CBC  WBC 4.0 - 10.5 K/uL 5.2  6.2  4.7   Hemoglobin 12.0 - 15.0 g/dL 11.2  10.6  10.7   Hematocrit 36.0 - 46.0 % 33.2  31.0  32.0   Platelets 150 - 400 K/uL 198  208  192   .CBC    Component Value Date/Time   WBC 5.2 02/11/2022 1129   RBC 3.47 (L) 02/11/2022 1129   HGB 11.2 (L) 02/11/2022 1129   HGB 10.4 (L) 12/31/2021 1054   HCT 33.2 (L) 02/11/2022 1129   PLT 198 02/11/2022 1129   PLT 223 12/31/2021 1054   MCV 95.7 02/11/2022 1129   MCH 32.3 02/11/2022 1129   MCHC 33.7 02/11/2022 1129   RDW 13.3 02/11/2022 1129   LYMPHSABS 0.9 02/11/2022 1129   MONOABS 0.5 02/11/2022 1129   EOSABS 0.2 02/11/2022 1129   BASOSABS 0.0 02/11/2022 1129       Latest Ref Rng & Units 02/11/2022   11:29 AM 01/28/2022   10:43 AM 01/16/2022    1:17 PM  CMP  Glucose 70 - 99  mg/dL 105  99  114   BUN 8 - 23 mg/dL _0 Creatinine 0.44 - 1.00 mg/dL 1.28  1.02  1.15   Sodium 135 - 145 mmol/L 139  138  142   Potassium 3.5 - 5.1 mmol/L 3.1  3.7  3.3   Chloride 98 - 111 mmol/L 104  105  106   CO2 22 - 32 mmol/L _1 Calcium 8.9 - 10.3 mg/dL 9.5  9.0  9.7   Total Protein 6.5 - 8.1 g/dL 6.8  6.6  6.5   Total Bilirubin 0.3 - 1.2 mg/dL 0.4  0.5  0.5   Alkaline Phos 38 - 126 U/L 38  30  34   AST 15 - 41 U/L 28  24  33   ALT 0 - 44 U/L _2 Surgical Pathology  CASE: [id number]  PATIENT: Traci [name]  Bone Marrow Report    Clinical History: Multiple myeloma , Bence-jones proteinuria  (BH)   DIAGNOSIS:   BONE MARROW, ASPIRATE, CLOT, CORE:  -Normocellular bone marrow with plasma cell neoplasm  -See comment   PERIPHERAL BLOOD:  -Normocytic-normochromic anemia   COMMENT:   The bone marrow is generally normocellular for age with trilineage  hematopoiesis.  In this background, the plasma cells are increased in  number representing 12% of cells associated with small clusters in the  clot and biopsy sections.  The plasma cells display lambda light chain  restriction consistent with plasma cell neoplasm.  Correlation with  cytogenetic and FISH  studies is recommended.     RADIOGRAPHIC STUDIES: I have personally reviewed the radiological images as listed and agreed with the findings in the report. ECHOCARDIOGRAM COMPLETE  Result Date: 01/17/2022    ECHOCARDIOGRAM REPORT   Patient Name:   Traci [name] Date of Exam: 01/17/2022 Medical Rec #:  [id number]     Height:       63.0 in Accession #:    [id number]    Weight:       120.5 lb Date of Birth:  12-27-1950     BSA:          1.559 m Patient Age:    8 years      BP:           116/71 mmHg Patient Gender: F             HR:           69 bpm. Exam Location:  [address] Procedure: 2D Echo, 3D Echo, Cardiac Doppler, Color Doppler and Strain Analysis Indications:    I31.39 Pericardial effusion   History:        Patient has prior history of Echocardiogram examinations, most                 recent 07/11/2021. Cardiomyopathy, Signs/Symptoms:Murmur; Risk                 Factors:Hypertension, Dyslipidemia, Family History of Coronary                 Artery Disease and Former Smoker. H/o breast cancer. Anemia.                 Multiple myeloma.  Sonographer:    Basilia Jumbo BS, RDCS Referring Phys: [id number] [facility]  Sonographer Comments: Image acquisition challenging due to breast implants. IMPRESSIONS  1. Left ventricular ejection fraction, by estimation, is 60 to 65%. The left ventricle has normal function. The left ventricle has no regional wall motion abnormalities. There is moderate left ventricular hypertrophy. Left ventricular diastolic parameters are consistent with Grade I diastolic dysfunction (impaired relaxation). The average left ventricular global longitudinal strain is -23.6 %. The global longitudinal strain is normal.  2. Right ventricular systolic function is normal. The right ventricular size is normal. There is normal pulmonary artery systolic pressure.  3. Left atrial size was severely dilated.  4. A small pericardial effusion is present.  5. The mitral valve is normal in structure. Mild mitral valve regurgitation. No evidence of mitral stenosis.  6. The aortic valve is tricuspid. Aortic valve regurgitation is not visualized. Aortic valve sclerosis/calcification is present, without any evidence of aortic stenosis.  7. The inferior vena cava is normal in size with greater than 50% respiratory variability, suggesting right atrial pressure of 3 mmHg. Comparison(s): 07/11/21 EF 60-65%. GLS -27.7%. FINDINGS  Left Ventricle: Left ventricular ejection fraction, by estimation, is 60 to 65%. The left ventricle has normal function. The left ventricle has no regional wall motion abnormalities. The average left ventricular global longitudinal strain is -23.6 %. The global longitudinal strain is normal. The  left ventricular internal cavity size was normal in size. There is moderate left ventricular hypertrophy. Left ventricular diastolic parameters are consistent with Grade I diastolic dysfunction (impaired relaxation). Indeterminate filling pressures. Right Ventricle: The right ventricular size is normal. Right ventricular systolic function is normal. There is normal pulmonary artery systolic pressure. The tricuspid regurgitant velocity is 2.39 m/s, and with an assumed right atrial pressure of 8 mmHg,  the estimated right ventricular systolic pressure is 68.3 mmHg. Left Atrium: Left atrial size was severely dilated. Right Atrium: Right atrial size was normal in size. Pericardium: A small pericardial effusion is present. Mitral Valve: The mitral valve is normal in structure. Mild mitral valve regurgitation. No evidence of mitral valve stenosis. Tricuspid Valve: The tricuspid valve is normal in structure. Tricuspid valve regurgitation is mild . No evidence of tricuspid stenosis. Aortic Valve: The aortic valve is tricuspid. Aortic valve regurgitation is not visualized. Aortic valve sclerosis/calcification is present, without any evidence of aortic stenosis. Pulmonic Valve: The pulmonic valve was normal in structure. Pulmonic valve regurgitation is mild. No evidence of pulmonic stenosis. Aorta: The aortic root is normal in size and structure. Venous: The inferior vena cava is normal in size with greater than 50% respiratory variability, suggesting right atrial pressure of 3 mmHg. IAS/Shunts: No atrial level shunt detected by color flow Doppler.  LEFT VENTRICLE PLAX 2D LVIDd:         3.90 cm   Diastology LVIDs:         2.50 cm   LV e' medial:    6.53 cm/s LV PW:         1.40 cm   LV E/e' medial:  13.8 LV IVS:        1.20 cm   LV e' lateral:   9.53 cm/s LVOT diam:     2.40 cm   LV E/e' lateral: 9.5 LV SV:         153 LV SV Index:   98        2D Longitudinal Strain LVOT Area:     4.52 cm  2D Strain GLS (A2C):   -18.5 %                           2D Strain GLS (A3C):   -29.7 %                          2D Strain GLS (A4C):   -22.7 %                          2D Strain GLS Avg:     -23.6 %                           3D Volume EF:                          3D EF:        56 %                          LV EDV:       125 ml                          LV ESV:       55 ml                          LV SV:        70 ml RIGHT VENTRICLE             IVC RV Basal diam:  3.90 cm     IVC diam: 2.00 cm RV S prime:  13.60 cm/s TAPSE (M-mode): 3.4 cm RVSP:           30.8 mmHg LEFT ATRIUM             Index        RIGHT ATRIUM           Index LA diam:        4.30 cm 2.76 cm/m   RA Pressure: 8.00 mmHg LA Vol (A2C):   79.9 ml 51.25 ml/m  RA Area:     17.80 cm LA Vol (A4C):   63.9 ml 40.99 ml/m  RA Volume:   49.00 ml  31.43 ml/m LA Biplane Vol: 77.7 ml 49.84 ml/m  AORTIC VALVE LVOT Vmax:   143.00 cm/s LVOT Vmean:  99.700 cm/s LVOT VTI:    0.339 m  AORTA Ao Root diam: 3.10 cm Ao Asc diam:  3.70 cm MITRAL VALVE                TRICUSPID VALVE                             TR Peak grad:   22.8 mmHg MV Decel Time: 215 msec     TR Vmax:        239.00 cm/s MV E velocity: 90.10 cm/s   Estimated RAP:  8.00 mmHg MV A velocity: 118.00 cm/s  RVSP:           30.8 mmHg MV E/A ratio:  0.76                             SHUNTS                             Systemic VTI:  0.34 m                             Systemic Diam: 2.40 cm Kirk Ruths MD Electronically signed by Kirk Ruths MD Signature Date/Time: 01/17/2022/1:00:50 PM    Final     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 Bone marrow biopsy with 12% lambda restricted plasma cells PET CT scan with no hypermetabolic bone lesions Cytogenetics normal female karyotype Molecular cytogenetics atypical t (11;14) Component     Latest Ref Rng & Units 01/30/2021  IgG (Immunoglobin G),  Serum     586 - 1,602 mg/dL 610  IgA     64 - 422 mg/dL 91  IgM (Immunoglobulin M), Srm     26 - 217 mg/dL 79  Total Protein ELP     6.0 - 8.5 g/dL 6.8  Albumin SerPl Elph-Mcnc     2.9 - 4.4 g/dL 4.3  Alpha 1     0.0 - 0.4 g/dL 0.2  Alpha2 Glob SerPl Elph-Mcnc     0.4 - 1.0 g/dL 0.7  B-Globulin SerPl Elph-Mcnc     0.7 - 1.3 g/dL 1.0  Gamma Glob SerPl Elph-Mcnc     0.4 - 1.8 g/dL 0.6  M Protein SerPl Elph-Mcnc     Not Observed g/dL Not Observed  Globulin, Total     2.2 - 3.9 g/dL 2.5  Albumin/Glob SerPl     0.7 - 1.7 1.8 (H)  IFE 1  Comment  Please Note (HCV):      Comment  Kappa free light chain     3.3 - 19.4 mg/L 11.8  Lambda free light chains     5.7 - 26.3 mg/L 480.8 (H)  Kappa, lambda light chain ratio     0.26 - 1.65 0.02 (L)   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  History of hypertension, dyslipidemia #5  Normocytic anemia with some worsening with hemoglobin down to 8.9 likely from Cytoxan.  Previous mild anemia likely related to chronic kidney disease and plasma cell dyscrasia.  Plan -Labs done today reviewed in detail, which shows that CBC and CMP are stable. -No prohibitive toxicities from Daratumumab at this time. -She is clear to proceed with cycle-9 day-1 of treatment today. -Other than some anemia thrombocytopenia patient has no significant other toxicities from her Daratumumab regimen at this time. -She is following with nephrology at [place] off on Crestor, if approved by her cardiologist, for about a month. -.Start Cymbalta.@ 75m po daily to address any neuropathic symptoms. -Start B complex.   Follow-up  Complete last daratumumab infusion as planned on 02/25/2022. RTC with Dr KIrene Limboin 2  months.  Labs 1 week prior to clinic visit  The total time spent in the appointment was 32 minutes* .  All of the patient's questions were answered with apparent satisfaction. The patient knows to call the clinic with any problems, questions or concerns.  I, AEugene Gavia am acting as scribe for Dr. GSullivan Lone MD  .I have reviewed the above documentation for accuracy and completeness, and I agree with the above.   GSullivan LoneMD MS AAHIVMS SAval[facility] Surgery And Robotic [facility] LLCCThe [facility] Center At DoralHematology/Oncology Physician [facility] .*Total Encounter Time as defined by the Centers for Medicare and Medicaid Services includes, in addition to the face-to-face time of a patient visit (documented in the note above) non-face-to-face time: obtaining and reviewing outside history, ordering and reviewing medications, tests or procedures, care coordination (communications with other health care professionals or caregivers) and documentation in the medical record.

## 2022-02-11 NOTE — Patient Instructions (Signed)
[facility] ONCOLOGY  Discharge Instructions: Thank you for choosing [facility] to provide your oncology and hematology care.   If you have a lab appointment with the [facility], please go directly to the [facility] and check in at the registration area.   Wear comfortable clothing and clothing appropriate for easy access to any Portacath or PICC line.   We strive to give you quality time with your provider. You may need to reschedule your appointment if you arrive late (15 or more minutes).  Arriving late affects you and other patients whose appointments are after yours.  Also, if you miss three or more appointments without notifying the office, you may be dismissed from the clinic at the provider's discretion.      For prescription refill requests, have your pharmacy contact our office and allow 72 hours for refills to be completed.    Today you received the following chemotherapy and/or immunotherapy agents: daratumumab and hyaluronidase      To help prevent nausea and vomiting after your treatment, we encourage you to take your nausea medication as directed.  BELOW ARE SYMPTOMS THAT SHOULD BE REPORTED IMMEDIATELY: *FEVER GREATER THAN 100.4 F (38 C) OR HIGHER *CHILLS OR SWEATING *NAUSEA AND VOMITING THAT IS NOT CONTROLLED WITH YOUR NAUSEA MEDICATION *UNUSUAL SHORTNESS OF BREATH *UNUSUAL BRUISING OR BLEEDING *URINARY PROBLEMS (pain or burning when urinating, or frequent urination) *BOWEL PROBLEMS (unusual diarrhea, constipation, pain near the anus) TENDERNESS IN MOUTH AND THROAT WITH OR WITHOUT PRESENCE OF ULCERS (sore throat, sores in mouth, or a toothache) UNUSUAL RASH, SWELLING OR PAIN  UNUSUAL VAGINAL DISCHARGE OR ITCHING   Items with * indicate a potential emergency and should be followed up as soon as possible or go to the Emergency Department if any problems should occur.  Please show the CHEMOTHERAPY ALERT CARD or IMMUNOTHERAPY ALERT  CARD at check-in to the Emergency Department and triage nurse.  Should you have questions after your visit or need to cancel or reschedule your appointment, please contact [facility]  Dept: [phone]  and follow the prompts.  Office hours are 8:00 a.m. to 4:30 p.m. Monday - Friday. Please note that voicemails left after 4:00 p.m. may not be returned until the following business day.  We are closed weekends and major holidays. You have access to a nurse at all times for urgent questions. Please call the main number to the clinic Dept: [phone] and follow the prompts.   For any non-urgent questions, you may also contact your provider using MyChart. We now offer e-Visits for anyone 41 and older to request care online for non-urgent symptoms. For details visit [email or web address].   Also download the MyChart app! Go to the app store, search "MyChart", open the app, select [facility], and log in with your MyChart username and password.  Masks are optional in the cancer centers. If you would like for your care team to wear a mask while they are taking care of you, please let them know. You may have one support person who is at least 73 years old accompany you for your appointments.

## 2022-02-12 LAB — KAPPA/LAMBDA LIGHT CHAINS
Kappa free light chain: 8 mg/L (ref 3.3–19.4)
Kappa, lambda light chain ratio: 1.23 (ref 0.26–1.65)
Lambda free light chains: 6.5 mg/L (ref 5.7–26.3)

## 2022-02-13 ENCOUNTER — Encounter: Payer: Self-pay | Admitting: Cardiology

## 2022-02-13 DIAGNOSIS — Z79899 Other long term (current) drug therapy: Secondary | ICD-10-CM | POA: Diagnosis not present

## 2022-02-13 DIAGNOSIS — D801 Nonfamilial hypogammaglobulinemia: Secondary | ICD-10-CM | POA: Diagnosis not present

## 2022-02-13 DIAGNOSIS — Z23 Encounter for immunization: Secondary | ICD-10-CM | POA: Diagnosis not present

## 2022-02-13 DIAGNOSIS — I129 Hypertensive chronic kidney disease with stage 1 through stage 4 chronic kidney disease, or unspecified chronic kidney disease: Secondary | ICD-10-CM | POA: Diagnosis not present

## 2022-02-13 DIAGNOSIS — R5383 Other fatigue: Secondary | ICD-10-CM | POA: Diagnosis not present

## 2022-02-13 DIAGNOSIS — Z6821 Body mass index (BMI) 21.0-21.9, adult: Secondary | ICD-10-CM | POA: Diagnosis not present

## 2022-02-13 DIAGNOSIS — D631 Anemia in chronic kidney disease: Secondary | ICD-10-CM | POA: Diagnosis not present

## 2022-02-13 DIAGNOSIS — D6481 Anemia due to antineoplastic chemotherapy: Secondary | ICD-10-CM | POA: Diagnosis not present

## 2022-02-13 DIAGNOSIS — C9001 Multiple myeloma in remission: Secondary | ICD-10-CM | POA: Diagnosis not present

## 2022-02-13 DIAGNOSIS — D8989 Other specified disorders involving the immune mechanism, not elsewhere classified: Secondary | ICD-10-CM | POA: Diagnosis not present

## 2022-02-13 DIAGNOSIS — M791 Myalgia, unspecified site: Secondary | ICD-10-CM | POA: Diagnosis not present

## 2022-02-13 DIAGNOSIS — N1831 Chronic kidney disease, stage 3a: Secondary | ICD-10-CM | POA: Diagnosis not present

## 2022-02-13 DIAGNOSIS — T451X5A Adverse effect of antineoplastic and immunosuppressive drugs, initial encounter: Secondary | ICD-10-CM | POA: Diagnosis not present

## 2022-02-15 ENCOUNTER — Telehealth: Payer: Self-pay | Admitting: Hematology

## 2022-02-15 LAB — MULTIPLE MYELOMA PANEL, SERUM
Albumin SerPl Elph-Mcnc: 3.8 g/dL (ref 2.9–4.4)
Albumin/Glob SerPl: 1.7 (ref 0.7–1.7)
Alpha 1: 0.3 g/dL (ref 0.0–0.4)
Alpha2 Glob SerPl Elph-Mcnc: 0.7 g/dL (ref 0.4–1.0)
B-Globulin SerPl Elph-Mcnc: 0.9 g/dL (ref 0.7–1.3)
Gamma Glob SerPl Elph-Mcnc: 0.4 g/dL (ref 0.4–1.8)
Globulin, Total: 2.3 g/dL (ref 2.2–3.9)
IgA: 19 mg/dL — ABNORMAL LOW (ref 64–422)
IgG (Immunoglobin G), Serum: 462 mg/dL — ABNORMAL LOW (ref 586–1602)
IgM (Immunoglobulin M), Srm: 35 mg/dL (ref 26–217)
M Protein SerPl Elph-Mcnc: 0.2 g/dL — ABNORMAL HIGH
Total Protein ELP: 6.1 g/dL (ref 6.0–8.5)

## 2022-02-15 NOTE — Telephone Encounter (Signed)
Left message with rescheduled upcoming appointment due to infusion availability.

## 2022-02-16 ENCOUNTER — Other Ambulatory Visit: Payer: Self-pay

## 2022-02-17 ENCOUNTER — Encounter: Payer: Self-pay | Admitting: Hematology

## 2022-02-20 ENCOUNTER — Other Ambulatory Visit: Payer: Self-pay

## 2022-02-25 ENCOUNTER — Inpatient Hospital Stay: Payer: Medicare Other

## 2022-02-25 ENCOUNTER — Ambulatory Visit: Payer: Medicare Other

## 2022-02-25 ENCOUNTER — Other Ambulatory Visit: Payer: Medicare Other

## 2022-02-25 VITALS — BP 124/74 | HR 67 | Temp 97.7°F | Resp 17 | Wt 123.2 lb

## 2022-02-25 DIAGNOSIS — Z5112 Encounter for antineoplastic immunotherapy: Secondary | ICD-10-CM | POA: Diagnosis not present

## 2022-02-25 DIAGNOSIS — R803 Bence Jones proteinuria: Secondary | ICD-10-CM | POA: Diagnosis not present

## 2022-02-25 DIAGNOSIS — I10 Essential (primary) hypertension: Secondary | ICD-10-CM | POA: Diagnosis not present

## 2022-02-25 DIAGNOSIS — D649 Anemia, unspecified: Secondary | ICD-10-CM | POA: Diagnosis not present

## 2022-02-25 DIAGNOSIS — Z7189 Other specified counseling: Secondary | ICD-10-CM

## 2022-02-25 DIAGNOSIS — C9000 Multiple myeloma not having achieved remission: Secondary | ICD-10-CM | POA: Diagnosis not present

## 2022-02-25 DIAGNOSIS — D8989 Other specified disorders involving the immune mechanism, not elsewhere classified: Secondary | ICD-10-CM

## 2022-02-25 DIAGNOSIS — M810 Age-related osteoporosis without current pathological fracture: Secondary | ICD-10-CM | POA: Diagnosis not present

## 2022-02-25 LAB — CMP (CANCER CENTER ONLY)
ALT: 15 U/L (ref 0–44)
AST: 22 U/L (ref 15–41)
Albumin: 4.1 g/dL (ref 3.5–5.0)
Alkaline Phosphatase: 36 U/L — ABNORMAL LOW (ref 38–126)
Anion gap: 8 (ref 5–15)
BUN: 29 mg/dL — ABNORMAL HIGH (ref 8–23)
CO2: 29 mmol/L (ref 22–32)
Calcium: 9.6 mg/dL (ref 8.9–10.3)
Chloride: 102 mmol/L (ref 98–111)
Creatinine: 1.19 mg/dL — ABNORMAL HIGH (ref 0.44–1.00)
GFR, Estimated: 48 mL/min — ABNORMAL LOW (ref >60)
Glucose, Bld: 117 mg/dL — ABNORMAL HIGH (ref 70–99)
Potassium: 3.1 mmol/L — ABNORMAL LOW (ref 3.5–5.1)
Sodium: 139 mmol/L (ref 135–145)
Total Bilirubin: 0.4 mg/dL (ref 0.3–1.2)
Total Protein: 6.3 g/dL — ABNORMAL LOW (ref 6.5–8.1)

## 2022-02-25 LAB — CBC WITH DIFFERENTIAL/PLATELET
Abs Immature Granulocytes: 0.01 10*3/uL (ref 0.00–0.07)
Basophils Absolute: 0.1 10*3/uL (ref 0.0–0.1)
Basophils Relative: 1 %
Eosinophils Absolute: 0.1 10*3/uL (ref 0.0–0.5)
Eosinophils Relative: 2 %
HCT: 32.3 % — ABNORMAL LOW (ref 36.0–46.0)
Hemoglobin: 11 g/dL — ABNORMAL LOW (ref 12.0–15.0)
Immature Granulocytes: 0 %
Lymphocytes Relative: 20 %
Lymphs Abs: 1.1 10*3/uL (ref 0.7–4.0)
MCH: 32.4 pg (ref 26.0–34.0)
MCHC: 34.1 g/dL (ref 30.0–36.0)
MCV: 95 fL (ref 80.0–100.0)
Monocytes Absolute: 0.4 10*3/uL (ref 0.1–1.0)
Monocytes Relative: 8 %
Neutro Abs: 3.6 10*3/uL (ref 1.7–7.7)
Neutrophils Relative %: 69 %
Platelets: 238 10*3/uL (ref 150–400)
RBC: 3.4 MIL/uL — ABNORMAL LOW (ref 3.87–5.11)
RDW: 12.8 % (ref 11.5–15.5)
WBC: 5.2 10*3/uL (ref 4.0–10.5)
nRBC: 0 % (ref 0.0–0.2)

## 2022-02-25 MED ORDER — LORATADINE 10 MG PO TABS
10.0000 mg | ORAL_TABLET | Freq: Once | ORAL | Status: AC
  Administered 2022-02-25: 10 mg via ORAL
  Filled 2022-02-25: qty 1

## 2022-02-25 MED ORDER — MONTELUKAST SODIUM 10 MG PO TABS
10.0000 mg | ORAL_TABLET | Freq: Once | ORAL | Status: AC
  Administered 2022-02-25: 10 mg via ORAL
  Filled 2022-02-25: qty 1

## 2022-02-25 MED ORDER — FAMOTIDINE 20 MG PO TABS
20.0000 mg | ORAL_TABLET | Freq: Once | ORAL | Status: AC
  Administered 2022-02-25: 20 mg via ORAL
  Filled 2022-02-25: qty 1

## 2022-02-25 MED ORDER — ACETAMINOPHEN 325 MG PO TABS
650.0000 mg | ORAL_TABLET | Freq: Once | ORAL | Status: AC
  Administered 2022-02-25: 650 mg via ORAL
  Filled 2022-02-25: qty 2

## 2022-02-25 MED ORDER — DARATUMUMAB-HYALURONIDASE-FIHJ 1800-30000 MG-UT/15ML ~~LOC~~ SOLN
1800.0000 mg | Freq: Once | SUBCUTANEOUS | Status: AC
  Administered 2022-02-25: 1800 mg via SUBCUTANEOUS
  Filled 2022-02-25: qty 15

## 2022-02-25 MED ORDER — ONDANSETRON HCL 8 MG PO TABS
8.0000 mg | ORAL_TABLET | Freq: Once | ORAL | Status: AC
  Administered 2022-02-25: 8 mg via ORAL
  Filled 2022-02-25: qty 1

## 2022-02-25 MED ORDER — DEXAMETHASONE 4 MG PO TABS
8.0000 mg | ORAL_TABLET | Freq: Once | ORAL | Status: AC
  Administered 2022-02-25: 8 mg via ORAL
  Filled 2022-02-25: qty 2

## 2022-02-25 NOTE — Patient Instructions (Signed)
[facility] ONCOLOGY  Discharge Instructions: Thank you for choosing [facility] to provide your oncology and hematology care.   If you have a lab appointment with the [facility], please go directly to the [facility] and check in at the registration area.   Wear comfortable clothing and clothing appropriate for easy access to any Portacath or PICC line.   We strive to give you quality time with your provider. You may need to reschedule your appointment if you arrive late (15 or more minutes).  Arriving late affects you and other patients whose appointments are after yours.  Also, if you miss three or more appointments without notifying the office, you may be dismissed from the clinic at the provider's discretion.      For prescription refill requests, have your pharmacy contact our office and allow 72 hours for refills to be completed.    Today you received the following chemotherapy and/or immunotherapy agents: daratumumab and hyaluronidase      To help prevent nausea and vomiting after your treatment, we encourage you to take your nausea medication as directed.  BELOW ARE SYMPTOMS THAT SHOULD BE REPORTED IMMEDIATELY: *FEVER GREATER THAN 100.4 F (38 C) OR HIGHER *CHILLS OR SWEATING *NAUSEA AND VOMITING THAT IS NOT CONTROLLED WITH YOUR NAUSEA MEDICATION *UNUSUAL SHORTNESS OF BREATH *UNUSUAL BRUISING OR BLEEDING *URINARY PROBLEMS (pain or burning when urinating, or frequent urination) *BOWEL PROBLEMS (unusual diarrhea, constipation, pain near the anus) TENDERNESS IN MOUTH AND THROAT WITH OR WITHOUT PRESENCE OF ULCERS (sore throat, sores in mouth, or a toothache) UNUSUAL RASH, SWELLING OR PAIN  UNUSUAL VAGINAL DISCHARGE OR ITCHING   Items with * indicate a potential emergency and should be followed up as soon as possible or go to the Emergency Department if any problems should occur.  Please show the CHEMOTHERAPY ALERT CARD or IMMUNOTHERAPY ALERT  CARD at check-in to the Emergency Department and triage nurse.  Should you have questions after your visit or need to cancel or reschedule your appointment, please contact [facility]  Dept: [phone]  and follow the prompts.  Office hours are 8:00 a.m. to 4:30 p.m. Monday - Friday. Please note that voicemails left after 4:00 p.m. may not be returned until the following business day.  We are closed weekends and major holidays. You have access to a nurse at all times for urgent questions. Please call the main number to the clinic Dept: [phone] and follow the prompts.   For any non-urgent questions, you may also contact your provider using MyChart. We now offer e-Visits for anyone 87 and older to request care online for non-urgent symptoms. For details visit [email or web address].   Also download the MyChart app! Go to the app store, search "MyChart", open the app, select [facility], and log in with your MyChart username and password.  Masks are optional in the cancer centers. If you would like for your care team to wear a mask while they are taking care of you, please let them know. You may have one support person who is at least 73 years old accompany you for your appointments.

## 2022-03-01 DIAGNOSIS — Z1231 Encounter for screening mammogram for malignant neoplasm of breast: Secondary | ICD-10-CM | POA: Diagnosis not present

## 2022-03-06 ENCOUNTER — Other Ambulatory Visit: Payer: Self-pay | Admitting: Hematology

## 2022-03-25 DIAGNOSIS — M810 Age-related osteoporosis without current pathological fracture: Secondary | ICD-10-CM | POA: Diagnosis not present

## 2022-04-01 DIAGNOSIS — R7989 Other specified abnormal findings of blood chemistry: Secondary | ICD-10-CM | POA: Diagnosis not present

## 2022-04-01 DIAGNOSIS — M810 Age-related osteoporosis without current pathological fracture: Secondary | ICD-10-CM | POA: Diagnosis not present

## 2022-04-01 DIAGNOSIS — E273 Drug-induced adrenocortical insufficiency: Secondary | ICD-10-CM | POA: Diagnosis not present

## 2022-04-01 DIAGNOSIS — E785 Hyperlipidemia, unspecified: Secondary | ICD-10-CM | POA: Diagnosis not present

## 2022-04-01 DIAGNOSIS — I10 Essential (primary) hypertension: Secondary | ICD-10-CM | POA: Diagnosis not present

## 2022-04-01 DIAGNOSIS — R7301 Impaired fasting glucose: Secondary | ICD-10-CM | POA: Diagnosis not present

## 2022-04-05 ENCOUNTER — Other Ambulatory Visit: Payer: Self-pay

## 2022-04-05 DIAGNOSIS — C9000 Multiple myeloma not having achieved remission: Secondary | ICD-10-CM

## 2022-04-09 ENCOUNTER — Inpatient Hospital Stay: Payer: Medicare Other | Attending: Hematology

## 2022-04-09 DIAGNOSIS — Z79899 Other long term (current) drug therapy: Secondary | ICD-10-CM | POA: Insufficient documentation

## 2022-04-09 DIAGNOSIS — Z7289 Other problems related to lifestyle: Secondary | ICD-10-CM | POA: Diagnosis not present

## 2022-04-09 DIAGNOSIS — D649 Anemia, unspecified: Secondary | ICD-10-CM | POA: Diagnosis not present

## 2022-04-09 DIAGNOSIS — C9000 Multiple myeloma not having achieved remission: Secondary | ICD-10-CM

## 2022-04-09 DIAGNOSIS — R803 Bence Jones proteinuria: Secondary | ICD-10-CM | POA: Diagnosis not present

## 2022-04-09 DIAGNOSIS — E785 Hyperlipidemia, unspecified: Secondary | ICD-10-CM | POA: Insufficient documentation

## 2022-04-09 DIAGNOSIS — Z800 Family history of malignant neoplasm of digestive organs: Secondary | ICD-10-CM | POA: Insufficient documentation

## 2022-04-09 DIAGNOSIS — I10 Essential (primary) hypertension: Secondary | ICD-10-CM | POA: Diagnosis not present

## 2022-04-09 DIAGNOSIS — Z8249 Family history of ischemic heart disease and other diseases of the circulatory system: Secondary | ICD-10-CM | POA: Insufficient documentation

## 2022-04-09 DIAGNOSIS — Z87891 Personal history of nicotine dependence: Secondary | ICD-10-CM | POA: Diagnosis not present

## 2022-04-09 DIAGNOSIS — Z833 Family history of diabetes mellitus: Secondary | ICD-10-CM | POA: Insufficient documentation

## 2022-04-09 DIAGNOSIS — Z8042 Family history of malignant neoplasm of prostate: Secondary | ICD-10-CM | POA: Diagnosis not present

## 2022-04-09 DIAGNOSIS — D472 Monoclonal gammopathy: Secondary | ICD-10-CM | POA: Insufficient documentation

## 2022-04-09 LAB — CBC WITH DIFFERENTIAL (CANCER CENTER ONLY)
Abs Immature Granulocytes: 0.01 10*3/uL (ref 0.00–0.07)
Basophils Absolute: 0 10*3/uL (ref 0.0–0.1)
Basophils Relative: 1 %
Eosinophils Absolute: 0.1 10*3/uL (ref 0.0–0.5)
Eosinophils Relative: 2 %
HCT: 36.3 % (ref 36.0–46.0)
Hemoglobin: 12.6 g/dL (ref 12.0–15.0)
Immature Granulocytes: 0 %
Lymphocytes Relative: 24 %
Lymphs Abs: 1.4 10*3/uL (ref 0.7–4.0)
MCH: 32.3 pg (ref 26.0–34.0)
MCHC: 34.7 g/dL (ref 30.0–36.0)
MCV: 93.1 fL (ref 80.0–100.0)
Monocytes Absolute: 0.6 10*3/uL (ref 0.1–1.0)
Monocytes Relative: 10 %
Neutro Abs: 3.7 10*3/uL (ref 1.7–7.7)
Neutrophils Relative %: 63 %
Platelet Count: 238 10*3/uL (ref 150–400)
RBC: 3.9 MIL/uL (ref 3.87–5.11)
RDW: 12.8 % (ref 11.5–15.5)
WBC Count: 5.8 10*3/uL (ref 4.0–10.5)
nRBC: 0 % (ref 0.0–0.2)

## 2022-04-09 LAB — CMP (CANCER CENTER ONLY)
ALT: 20 U/L (ref 0–44)
AST: 27 U/L (ref 15–41)
Albumin: 4.7 g/dL (ref 3.5–5.0)
Alkaline Phosphatase: 39 U/L (ref 38–126)
Anion gap: 10 (ref 5–15)
BUN: 29 mg/dL — ABNORMAL HIGH (ref 8–23)
CO2: 25 mmol/L (ref 22–32)
Calcium: 10.3 mg/dL (ref 8.9–10.3)
Chloride: 103 mmol/L (ref 98–111)
Creatinine: 1.18 mg/dL — ABNORMAL HIGH (ref 0.44–1.00)
GFR, Estimated: 49 mL/min — ABNORMAL LOW (ref >60)
Glucose, Bld: 95 mg/dL (ref 70–99)
Potassium: 3.6 mmol/L (ref 3.5–5.1)
Sodium: 138 mmol/L (ref 135–145)
Total Bilirubin: 0.5 mg/dL (ref 0.3–1.2)
Total Protein: 6.9 g/dL (ref 6.5–8.1)

## 2022-04-10 LAB — KAPPA/LAMBDA LIGHT CHAINS
Kappa free light chain: 7.2 mg/L (ref 3.3–19.4)
Kappa, lambda light chain ratio: 1.53 (ref 0.26–1.65)
Lambda free light chains: 4.7 mg/L — ABNORMAL LOW (ref 5.7–26.3)

## 2022-04-11 DIAGNOSIS — R7301 Impaired fasting glucose: Secondary | ICD-10-CM | POA: Diagnosis not present

## 2022-04-11 DIAGNOSIS — R82998 Other abnormal findings in urine: Secondary | ICD-10-CM | POA: Diagnosis not present

## 2022-04-11 DIAGNOSIS — Z853 Personal history of malignant neoplasm of breast: Secondary | ICD-10-CM | POA: Diagnosis not present

## 2022-04-11 DIAGNOSIS — N1831 Chronic kidney disease, stage 3a: Secondary | ICD-10-CM | POA: Diagnosis not present

## 2022-04-11 DIAGNOSIS — I700 Atherosclerosis of aorta: Secondary | ICD-10-CM | POA: Diagnosis not present

## 2022-04-11 DIAGNOSIS — Z0000 Encounter for general adult medical examination without abnormal findings: Secondary | ICD-10-CM | POA: Diagnosis not present

## 2022-04-11 DIAGNOSIS — M7918 Myalgia, other site: Secondary | ICD-10-CM | POA: Diagnosis not present

## 2022-04-11 DIAGNOSIS — Z1331 Encounter for screening for depression: Secondary | ICD-10-CM | POA: Diagnosis not present

## 2022-04-11 DIAGNOSIS — I129 Hypertensive chronic kidney disease with stage 1 through stage 4 chronic kidney disease, or unspecified chronic kidney disease: Secondary | ICD-10-CM | POA: Diagnosis not present

## 2022-04-11 DIAGNOSIS — I10 Essential (primary) hypertension: Secondary | ICD-10-CM | POA: Diagnosis not present

## 2022-04-11 DIAGNOSIS — M810 Age-related osteoporosis without current pathological fracture: Secondary | ICD-10-CM | POA: Diagnosis not present

## 2022-04-11 DIAGNOSIS — R803 Bence Jones proteinuria: Secondary | ICD-10-CM | POA: Diagnosis not present

## 2022-04-11 DIAGNOSIS — C9000 Multiple myeloma not having achieved remission: Secondary | ICD-10-CM | POA: Diagnosis not present

## 2022-04-11 DIAGNOSIS — I2510 Atherosclerotic heart disease of native coronary artery without angina pectoris: Secondary | ICD-10-CM | POA: Diagnosis not present

## 2022-04-11 DIAGNOSIS — D8989 Other specified disorders involving the immune mechanism, not elsewhere classified: Secondary | ICD-10-CM | POA: Diagnosis not present

## 2022-04-11 DIAGNOSIS — Z1339 Encounter for screening examination for other mental health and behavioral disorders: Secondary | ICD-10-CM | POA: Diagnosis not present

## 2022-04-11 DIAGNOSIS — E785 Hyperlipidemia, unspecified: Secondary | ICD-10-CM | POA: Diagnosis not present

## 2022-04-15 ENCOUNTER — Inpatient Hospital Stay (HOSPITAL_BASED_OUTPATIENT_CLINIC_OR_DEPARTMENT_OTHER): Payer: Medicare Other | Admitting: Hematology

## 2022-04-15 DIAGNOSIS — D8989 Other specified disorders involving the immune mechanism, not elsewhere classified: Secondary | ICD-10-CM | POA: Diagnosis not present

## 2022-04-15 DIAGNOSIS — D649 Anemia, unspecified: Secondary | ICD-10-CM | POA: Diagnosis not present

## 2022-04-15 DIAGNOSIS — Z79899 Other long term (current) drug therapy: Secondary | ICD-10-CM | POA: Diagnosis not present

## 2022-04-15 DIAGNOSIS — E785 Hyperlipidemia, unspecified: Secondary | ICD-10-CM | POA: Diagnosis not present

## 2022-04-15 DIAGNOSIS — I10 Essential (primary) hypertension: Secondary | ICD-10-CM | POA: Diagnosis not present

## 2022-04-15 DIAGNOSIS — D472 Monoclonal gammopathy: Secondary | ICD-10-CM | POA: Diagnosis not present

## 2022-04-15 DIAGNOSIS — R803 Bence Jones proteinuria: Secondary | ICD-10-CM | POA: Diagnosis not present

## 2022-04-15 NOTE — Progress Notes (Signed)
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  HISTORY OF PRESENTING ILLNESS:  Please see previous note for details on initial presentation  INTERVAL HISTORY  [name] is a 71 y.o. 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  .I connected with Traci [name] on 04/15/2022 at  8:40 AM EST by telephone visit and verified that I am speaking with the correct person using two identifiers.   Patient was last seen by me on 02/11/2022 and complained of diffused body aches with movement.   Patient reports she has been doing well overall without any new medical concerns. She still complains of generalized body aches. Patient reports that her primary physician held her statin medication for 4-6 weeks to see if body aches improve, but they did not improve. Her primary care physician has referred her to Rheumatologist.   I discussed the limitations, risks, security and privacy concerns of performing an evaluation and management service by telemedicine and the availability of in-person appointments. I also discussed with the patient that there may be a patient responsible charge related to this service. The patient expressed understanding and agreed to proceed.   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 Percival Spanish   Breast cancer [facility])    PMH of, Dr. Marylene Buerger, Dr. Truddie Coco  multilpe sites overlaping, ER +   Facet syndrome, lumbar    Dr. Lynann Bologna   Heart murmur    no problems   History of COVID-19 05/07/2019   Hyperlipidemia    Hyperlipidemia    Hypertension    Osteopenia    Skin cancer, basal cell    Dr. Amy Martinique     SURGICAL HISTORY: Past Surgical History:  Procedure Laterality Date   CATARACT EXTRACTION Right 06/2020   CHOLECYSTECTOMY  about 1993   COLONOSCOPY      X 3 negative (last 2008, due 2015), Dr. Olevia Perches   MASTECTOMY Left 11/2004   Dr. Marylene Buerger, also reduction of right breast and reconstruction on the left.   ROTATOR CUFF REPAIR Left    TONSILLECTOMY     TRANSESOPHAGEAL ECHOCARDIOGRAM  2012   Overall left ventricular systolic function was normal. Left ventricular ejection fraction was estimated, range being 55% to 60%. There were no left ventricular regional wall motional abnormalities. Left ventricular wall thickness was mildly increased. There was mild focal basal septal hypertrophy.   WISDOM TOOTH EXTRACTION      SOCIAL HISTORY: Social History   Socioeconomic History   Marital status: Married    Spouse name: Not on file   Number of children: Not on file   Years of education: Not on file   Highest education level: Not on file  Occupational History   Occupation: Retired    Fish farm manager: UNEMPLOYED  Tobacco Use   Smoking status: Former    Packs/day: 1.00    Years:  10.00    Total pack years: 10.00    Types: Cigarettes    Quit date: 05/06/1978    Years since quitting: 43.9   Smokeless tobacco: Never   Tobacco comments:    smoked [place], up to 1 ppd  Vaping Use   Vaping Use: Never used  Substance and Sexual Activity   Alcohol use: Yes    Alcohol/week: 4.0 standard drinks of alcohol    Types: 4 Glasses of wine per week   Drug use: No   Sexual activity: Yes    Partners: Male    Birth control/protection: Post-menopausal  Other Topics Concern    Not on file  Social History Narrative   Married   Regular exercise: Yes, high level CVE, walks 2 mpd 5x/ week & gym   No specific diet   Social Determinants of Health   Financial Resource Strain: Not on file  Food Insecurity: Not on file  Transportation Needs: Not on file  Physical Activity: Not on file  Stress: Not on file  Social Connections: Not on file  Intimate Partner Violence: Not on file    FAMILY HISTORY: Family History  Problem Relation Age of Onset   Hypertension Mother    Coronary artery disease Mother        no MI   Lung cancer Mother        smoker   Colon cancer Father 74   Coronary artery disease Father        no MI   Prostate cancer Father    Diabetes Father    Hypertension Sister    Cancer Maternal Grandfather        GI   Diabetes Paternal Grandfather    Stroke Neg Hx    Esophageal cancer Neg Hx    Stomach cancer Neg Hx    Rectal cancer Neg Hx     ALLERGIES:  is allergic to shellfish allergy, codeine, hydrocodone, and vicodin [hydrocodone-acetaminophen].  MEDICATIONS:  Current Outpatient Medications  Medication Sig Dispense Refill   acyclovir (ZOVIRAX) 400 MG tablet TAKE 1 TABLET BY MOUTH TWICE A DAY 180 tablet 1   amLODipine (NORVASC) 10 MG tablet Take 1 tablet (10 mg total) by mouth daily. 30 tablet 0   b complex vitamins capsule Take 1 capsule by mouth daily. 30 capsule 4   benazepril (LOTENSIN) 40 MG tablet Take 1 tablet (40 mg total) by mouth daily. 30 tablet 0   cholecalciferol (VITAMIN D3) 25 MCG (1000 UNIT) tablet Take 2 tablets (2,000 Units total) by mouth daily. 60 tablet 11   denosumab (PROLIA) 60 MG/ML SOLN injection Inject 60 mg into the skin every 6 (six) months. Administer in upper arm, thigh, or abdomen     DULoxetine (CYMBALTA) 30 MG capsule TAKE 1 CAPSULE BY MOUTH EVERY DAY 90 capsule 2   iron polysaccharides (NIFEREX) 150 MG capsule Take 1 capsule (150 mg total) by mouth daily. 30 capsule 2   LORazepam (ATIVAN) 0.5 MG tablet  Take 1 tablet (0.5 mg total) by mouth every 6 (six) hours as needed (Nausea or vomiting). 30 tablet 0   ondansetron (ZOFRAN) 8 MG tablet Take 8 mg by mouth 30 to 60 min prior to Cytoxan administration then take 8 mg twice daily as needed for nausea and vomiting. 30 tablet 1   prochlorperazine (COMPAZINE) 10 MG tablet Take 1 tablet (10 mg total) by mouth every 6 (six) hours as needed (Nausea or vomiting). 30 tablet 1   No current facility-administered medications for  this visit.    REVIEW OF SYSTEMS:   10 Point review of Systems was done is negative except as noted above.  PHYSICAL EXAMINATION :  Telemedicine visit  LABORATORY DATA:  I have reviewed the data as listed  .    Latest Ref Rng & Units 04/09/2022   12:10 PM 02/25/2022   12:40 PM 02/11/2022   11:29 AM  CBC  WBC 4.0 - 10.5 K/uL 5.8  5.2  5.2   Hemoglobin 12.0 - 15.0 g/dL 12.6  11.0  11.2   Hematocrit 36.0 - 46.0 % 36.3  32.3  33.2   Platelets 150 - 400 K/uL 238  238  198   .CBC    Component Value Date/Time   WBC 5.8 04/09/2022 1210   WBC 5.2 02/25/2022 1240   RBC 3.90 04/09/2022 1210   HGB 12.6 04/09/2022 1210   HCT 36.3 04/09/2022 1210   PLT 238 04/09/2022 1210   MCV 93.1 04/09/2022 1210   MCH 32.3 04/09/2022 1210   MCHC 34.7 04/09/2022 1210   RDW 12.8 04/09/2022 1210   LYMPHSABS 1.4 04/09/2022 1210   MONOABS 0.6 04/09/2022 1210   EOSABS 0.1 04/09/2022 1210   BASOSABS 0.0 04/09/2022 1210       Latest Ref Rng & Units 04/09/2022   12:10 PM 02/25/2022   12:40 PM 02/11/2022   11:29 AM  CMP  Glucose 70 - 99 mg/dL 95  117  105   BUN 8 - 23 mg/dL _0 Creatinine 0.44 - 1.00 mg/dL 1.18  1.19  1.28   Sodium 135 - 145 mmol/L 138  139  139   Potassium 3.5 - 5.1 mmol/L 3.6  3.1  3.1   Chloride 98 - 111 mmol/L 103  102  104   CO2 22 - 32 mmol/L _1 Calcium 8.9 - 10.3 mg/dL 10.3  9.6  9.5   Total Protein 6.5 - 8.1 g/dL 6.9  6.3  6.8   Total Bilirubin 0.3 - 1.2 mg/dL 0.5  0.4  0.4   Alkaline Phos  38 - 126 U/L 39  36  38   AST 15 - 41 U/L _2 ALT 0 - 44 U/L _3 Surgical Pathology  CASE: [id number]  PATIENT: Iliany Montroy  Bone Marrow Report    Clinical History: Multiple myeloma , Bence-jones proteinuria  (BH)   DIAGNOSIS:   BONE MARROW, ASPIRATE, CLOT, CORE:  -Normocellular bone marrow with plasma cell neoplasm  -See comment   PERIPHERAL BLOOD:  -Normocytic-normochromic anemia   COMMENT:   The bone marrow is generally normocellular for age with trilineage  hematopoiesis.  In this background, the plasma cells are increased in  number representing 12% of cells associated with small clusters in the  clot and biopsy sections.  The plasma cells display lambda light chain  restriction consistent with plasma cell neoplasm.  Correlation with  cytogenetic and FISH studies is recommended.     RADIOGRAPHIC STUDIES: I have personally reviewed the radiological images as listed and agreed with the findings in the report. No results found.  ASSESSMENT & PLAN:   71 year old female with history of hypertrophic cardiomyopathy, hypertension, dyslipidemia with  #1  Smoldering myeloma with renal LCDD Bone marrow biopsy with 12% lambda restricted plasma cells PET CT scan with no hypermetabolic bone lesions Cytogenetics normal female karyotype Molecular cytogenetics atypical t (11;14) Component  Latest Ref Rng & Units 01/30/2021  IgG (Immunoglobin G), Serum     586 - 1,602 mg/dL 610  IgA     64 - 422 mg/dL 91  IgM (Immunoglobulin M), Srm     26 - 217 mg/dL 79  Total Protein ELP     6.0 - 8.5 g/dL 6.8  Albumin SerPl Elph-Mcnc     2.9 - 4.4 g/dL 4.3  Alpha 1     0.0 - 0.4 g/dL 0.2  Alpha2 Glob SerPl Elph-Mcnc     0.4 - 1.0 g/dL 0.7  B-Globulin SerPl Elph-Mcnc     0.7 - 1.3 g/dL 1.0  Gamma Glob SerPl Elph-Mcnc     0.4 - 1.8 g/dL 0.6  M Protein SerPl Elph-Mcnc     Not Observed g/dL Not Observed  Globulin, Total     2.2 - 3.9 g/dL 2.5   Albumin/Glob SerPl     0.7 - 1.7 1.8 (H)  IFE 1      Comment  Please Note (HCV):      Comment  Kappa free light chain     3.3 - 19.4 mg/L 11.8  Lambda free light chains     5.7 - 26.3 mg/L 480.8 (H)  Kappa, lambda light chain ratio     0.26 - 1.65 0.02 (L)   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  History of hypertension, dyslipidemia #5  Normocytic anemia with some worsening with hemoglobin down to 8.9 likely from Cytoxan.  Previous mild anemia likely related to chronic kidney disease and plasma cell dyscrasia.  Plan -Discussed labs from 04/09/2022 with the patient. CBC stable. CMP showed elevated creatine levels of 1.18. Light chain with normal ratio.  -Discussed that she might need to visit Neurologist for nerve conduction study, if her Rheumatology results are unrevealing.  -Discussed her recent urine test results, at [facility], showed no abnormalities.  -Will follow up with Dr. Little Ishikawa in [place] in about 3 months for lab.  -Will see [place] in 6 months.  -No prohibitive toxicities from Daratumumab at this time.  Follow-up RTC with Dr Irene Limbo in 6 months Labs 1 week prior to clinic visit F/u with Dr Amalia Hailey at [facility] in 3 months  The total time spent in the appointment was 20 minutes* .  All of the patient's questions were answered with apparent satisfaction. The patient knows to call the clinic with any problems, questions or concerns.   Sullivan Lone MD MS AAHIVMS [facility] Hematology/Oncology Physician [facility]  .*Total Encounter Time as defined by the Centers for Medicare and Medicaid Services includes, in addition to the face-to-face time of a patient visit (documented in the note above) non-face-to-face  time: obtaining and reviewing outside history, ordering and reviewing medications, tests or procedures, care coordination (communications with other health care professionals or caregivers) and documentation in the medical record.   I, Cleda Mccreedy, am acting as a Education administrator for Sullivan Lone, MD.  .I have reviewed the above documentation for accuracy and completeness, and I agree with the above. Brunetta Genera MD

## 2022-04-16 ENCOUNTER — Ambulatory Visit: Payer: Medicare Other | Admitting: Hematology

## 2022-04-16 LAB — MULTIPLE MYELOMA PANEL, SERUM
Albumin SerPl Elph-Mcnc: 4.1 g/dL (ref 2.9–4.4)
Albumin/Glob SerPl: 1.9 — ABNORMAL HIGH (ref 0.7–1.7)
Alpha 1: 0.2 g/dL (ref 0.0–0.4)
Alpha2 Glob SerPl Elph-Mcnc: 0.7 g/dL (ref 0.4–1.0)
B-Globulin SerPl Elph-Mcnc: 0.9 g/dL (ref 0.7–1.3)
Gamma Glob SerPl Elph-Mcnc: 0.4 g/dL (ref 0.4–1.8)
Globulin, Total: 2.2 g/dL (ref 2.2–3.9)
IgA: 22 mg/dL — ABNORMAL LOW (ref 64–422)
IgG (Immunoglobin G), Serum: 427 mg/dL — ABNORMAL LOW (ref 586–1602)
IgM (Immunoglobulin M), Srm: 37 mg/dL (ref 26–217)
M Protein SerPl Elph-Mcnc: 0.2 g/dL — ABNORMAL HIGH
Total Protein ELP: 6.3 g/dL (ref 6.0–8.5)

## 2022-04-17 ENCOUNTER — Other Ambulatory Visit: Payer: Self-pay

## 2022-04-21 ENCOUNTER — Encounter: Payer: Self-pay | Admitting: Hematology

## 2022-05-10 ENCOUNTER — Encounter: Payer: Self-pay | Admitting: Hematology

## 2022-05-14 DIAGNOSIS — H524 Presbyopia: Secondary | ICD-10-CM | POA: Diagnosis not present

## 2022-05-14 DIAGNOSIS — H2512 Age-related nuclear cataract, left eye: Secondary | ICD-10-CM | POA: Diagnosis not present

## 2022-05-14 NOTE — Progress Notes (Unsigned)
[facility] [place] Phone: [phone] Subjective:   Rito Ehrlich, am serving as a scribe for Dr. Hulan Saas.  I'm seeing this patient by the request  of:  Ginger Organ., MD  CC: Low back pain  [id number]  Traci [name] is a 72 y.o. female coming in with complaint of Low back pain. 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 Patient states she is in remission now and was doing well then in September she started having pain everywhere. Patient states lots of the time she feels like she has been in a car accident. Patient locates pain to lower back radiating down both legs to her calves, and now having behind the right knee pain. Patient describes the pain as low/dull aching. Patient states that she can no longer even sit criss cross like she could before. Patient is not able to take Advil or aleve for the pain because of her disease. States has taken tylenol which doesn't help. Patient used to be able to run up and down stairs, walking 4+ miles daily to having to take steps one by one and barley being able to walk without this pain. Patient also states that she has pain upper body that starts in her traps and radiates up her neck down her arms to about the top of her biceps, never to elbows.   Patient can lay down with no pain and sit with Minimal pain, patient states that the pain is livable but she would rather it not be there.  Patient is worried because even through her treatments she felt great and now she just feels like she is "100"  Has an appointment with O'Halloran next week and would like any help and insight to give him to help her.   Patient has right shoulder pain that she has to get a shoulder replacement on and she knows that issue.    NEED XRAYSS FIRST     Past Medical History:  Diagnosis Date   ASHD  (arteriosclerotic heart disease)    Mild septal hypertrophy, 2D ECHO 11/2006, Dr. Percival Spanish   Breast cancer [facility])    PMH of, Dr. Marylene Buerger, Dr. Truddie Coco  multilpe sites overlaping, ER +   Facet syndrome, lumbar    Dr. Lynann Bologna   Heart murmur    no problems   History of COVID-19 05/07/2019   Hyperlipidemia    Hyperlipidemia    Hypertension    Osteopenia    Skin cancer, basal cell    Dr. Amy Martinique   Past Surgical History:  Procedure Laterality Date   CATARACT EXTRACTION Right 06/2020   CHOLECYSTECTOMY  about 1993   COLONOSCOPY      X 3 negative (last 2008, due 2015), Dr. Olevia Perches   MASTECTOMY Left 11/2004   Dr. Marylene Buerger, also reduction of right breast and reconstruction on the left.   ROTATOR CUFF REPAIR Left    TONSILLECTOMY     TRANSESOPHAGEAL ECHOCARDIOGRAM  2012   Overall left ventricular systolic function was normal. Left ventricular ejection fraction was estimated, range being 55% to 60%. There were no left ventricular regional wall motional abnormalities. Left ventricular wall thickness was mildly increased. There was mild focal basal septal hypertrophy.   WISDOM TOOTH EXTRACTION     Social History   Socioeconomic History   Marital status: Married    Spouse name: Not on  file   Number of children: Not on file   Years of education: Not on file   Highest education level: Not on file  Occupational History   Occupation: Retired    Fish farm manager: UNEMPLOYED  Tobacco Use   Smoking status: Former    Packs/day: 1.00    Years: 10.00    Total pack years: 10.00    Types: Cigarettes    Quit date: 05/06/1978    Years since quitting: 44.0   Smokeless tobacco: Never   Tobacco comments:    smoked [place], up to 1 ppd  Vaping Use   Vaping Use: Never used  Substance and Sexual Activity   Alcohol use: Yes    Alcohol/week: 4.0 standard drinks of alcohol    Types: 4 Glasses of wine per week   Drug use: No   Sexual activity: Yes    Partners: Male    Birth control/protection:  Post-menopausal  Other Topics Concern   Not on file  Social History Narrative   Married   Regular exercise: Yes, high level CVE, walks 2 mpd 5x/ week & gym   No specific diet   Social Determinants of Health   Financial Resource Strain: Not on file  Food Insecurity: Not on file  Transportation Needs: Not on file  Physical Activity: Not on file  Stress: Not on file  Social Connections: Not on file   Allergies  Allergen Reactions   Shellfish Allergy     2014 Angioedema of the lips, pt had it since then with no reaction   Codeine     Other reaction(s): very sensitive   Hydrocodone     nausea   Vicodin [Hydrocodone-Acetaminophen]     nausea   Family History  Problem Relation Age of Onset   Hypertension Mother    Coronary artery disease Mother        no MI   Lung cancer Mother        smoker   Colon cancer Father 66   Coronary artery disease Father        no MI   Prostate cancer Father    Diabetes Father    Hypertension Sister    Cancer Maternal Grandfather        GI   Diabetes Paternal Grandfather    Stroke Neg Hx    Esophageal cancer Neg Hx    Stomach cancer Neg Hx    Rectal cancer Neg Hx     Current Outpatient Medications (Endocrine & Metabolic):    denosumab (PROLIA) 60 MG/ML SOLN injection, Inject 60 mg into the skin every 6 (six) months. Administer in upper arm, thigh, or abdomen  Current Outpatient Medications (Cardiovascular):    amLODipine (NORVASC) 10 MG tablet, Take 1 tablet (10 mg total) by mouth daily.   benazepril (LOTENSIN) 40 MG tablet, Take 1 tablet (40 mg total) by mouth daily.   rosuvastatin (CRESTOR) 10 MG tablet, Take 10 mg by mouth daily.    Current Outpatient Medications (Hematological):    iron polysaccharides (NIFEREX) 150 MG capsule, Take 1 capsule (150 mg total) by mouth daily.  Current Outpatient Medications (Other):    acyclovir (ZOVIRAX) 400 MG tablet, TAKE 1 TABLET BY MOUTH TWICE A DAY   b complex vitamins capsule, Take 1  capsule by mouth daily.   cholecalciferol (VITAMIN D3) 25 MCG (1000 UNIT) tablet, Take 2 tablets (2,000 Units total) by mouth daily.   gabapentin (NEURONTIN) 100 MG capsule, Take 1 capsule (100 mg total) by mouth 2 (two)  times daily.   DULoxetine (CYMBALTA) 30 MG capsule, TAKE 1 CAPSULE BY MOUTH EVERY DAY (Patient not taking: Reported on 05/15/2022)   Reviewed prior external information including notes and imaging from  primary care provider As well as notes that were available from care everywhere and other healthcare systems.  Past medical history, social, surgical and family history all reviewed in electronic medical record.  No pertanent information unless stated regarding to the chief complaint.   Review of Systems:  No headache, visual changes, nausea, vomiting, diarrhea, constipation, dizziness, abdominal pain, skin rash, fevers, chills, night sweats, weight loss, swollen lymph nodes, body aches, joint swelling, chest pain, shortness of breath, mood changes. POSITIVE muscle aches  Objective  Blood pressure 130/70, pulse 64, height '5\' 3"'$  (1.6 m), weight 122 lb (55.3 kg), last menstrual period 05/07/1999, SpO2 98 %.   General: No apparent distress alert and oriented x3 mood and affect normal, dressed appropriately.  HEENT: Pupils equal, extraocular movements intact  Respiratory: Patient's speak in full sentences and does not appear short of breath  Cardiovascular: No lower extremity edema, non tender, no erythema  Patient is low back pain does have some degenerative scoliosis noted.  Patient also has a step-off noted.  Patient does have tenderness to palpation in paraspinal musculature.  Patient does have tightness with Corky Sox right greater than left.  Weakness noted with 3 out of 5 strength in lower extremities.  Mild atrophy noted as well.  Patient does have mild foot drop even noted on the left side.    Impression and Recommendations:    The above documentation has been reviewed and  is accurate and complete Lyndal Pulley, DO

## 2022-05-15 ENCOUNTER — Ambulatory Visit (INDEPENDENT_AMBULATORY_CARE_PROVIDER_SITE_OTHER): Payer: Medicare Other

## 2022-05-15 ENCOUNTER — Ambulatory Visit (INDEPENDENT_AMBULATORY_CARE_PROVIDER_SITE_OTHER): Payer: Medicare Other | Admitting: Family Medicine

## 2022-05-15 ENCOUNTER — Encounter: Payer: Self-pay | Admitting: Hematology

## 2022-05-15 VITALS — BP 130/70 | HR 64 | Ht 63.0 in | Wt 122.0 lb

## 2022-05-15 DIAGNOSIS — M545 Low back pain, unspecified: Secondary | ICD-10-CM

## 2022-05-15 DIAGNOSIS — M546 Pain in thoracic spine: Secondary | ICD-10-CM | POA: Diagnosis not present

## 2022-05-15 DIAGNOSIS — M5432 Sciatica, left side: Secondary | ICD-10-CM

## 2022-05-15 DIAGNOSIS — M4316 Spondylolisthesis, lumbar region: Secondary | ICD-10-CM | POA: Diagnosis not present

## 2022-05-15 MED ORDER — GABAPENTIN 100 MG PO CAPS: 100.0000 mg | ORAL_CAPSULE | Freq: Two times a day (BID) | ORAL | 0 refills | Status: DC

## 2022-05-15 NOTE — Patient Instructions (Addendum)
Good to see you  Thoracic spine x ray on your way out Gabapentin '200mg'$  nightly  Mri thoracic and lumbar spine ordered call [phone] to schedule Tell PT that we are more concerned with spinal stenosis We will be in touch once the images are in

## 2022-05-15 NOTE — Assessment & Plan Note (Signed)
I am concerned the patient actually has some underlying spinal stenosis that could be contributing.  Discussed icing regimen and home exercises, though with the weakness noted at this time that we do need to consider the possibility of foot drop noted at this time.  I will concern that patient may have a nerve impingement that is fairly severe.  Patient also does have a history of multiple myeloma.  X-rays do show some sclerotic changes noted of the thoracic and lumbar spine.  Possible concerning for a occult fracture also noted in the T8 area.  Will get MRI secondary to this and the weakness noted.  No significant spondylolisthesis noted at the L5-S1 area we will need to monitor.  Depending on imaging will discuss different treatment options such as medications but will start gabapentin at this time.

## 2022-05-17 ENCOUNTER — Encounter: Payer: Self-pay | Admitting: Hematology

## 2022-05-19 ENCOUNTER — Other Ambulatory Visit: Payer: Self-pay | Admitting: Hematology

## 2022-05-22 DIAGNOSIS — R5383 Other fatigue: Secondary | ICD-10-CM | POA: Diagnosis not present

## 2022-05-22 DIAGNOSIS — M1991 Primary osteoarthritis, unspecified site: Secondary | ICD-10-CM | POA: Diagnosis not present

## 2022-05-22 DIAGNOSIS — M810 Age-related osteoporosis without current pathological fracture: Secondary | ICD-10-CM | POA: Diagnosis not present

## 2022-05-22 DIAGNOSIS — R202 Paresthesia of skin: Secondary | ICD-10-CM | POA: Diagnosis not present

## 2022-05-22 DIAGNOSIS — M7918 Myalgia, other site: Secondary | ICD-10-CM | POA: Diagnosis not present

## 2022-05-22 DIAGNOSIS — D7589 Other specified diseases of blood and blood-forming organs: Secondary | ICD-10-CM | POA: Diagnosis not present

## 2022-05-22 DIAGNOSIS — Z6823 Body mass index (BMI) 23.0-23.9, adult: Secondary | ICD-10-CM | POA: Diagnosis not present

## 2022-05-26 ENCOUNTER — Ambulatory Visit
Admission: RE | Admit: 2022-05-26 | Discharge: 2022-05-26 | Disposition: A | Discharge status: Home / Self Care | Payer: Medicare Other | Source: Ambulatory Visit | Attending: Family Medicine | Admitting: Family Medicine

## 2022-05-26 DIAGNOSIS — M48061 Spinal stenosis, lumbar region without neurogenic claudication: Secondary | ICD-10-CM | POA: Diagnosis not present

## 2022-05-26 DIAGNOSIS — M545 Low back pain, unspecified: Secondary | ICD-10-CM | POA: Diagnosis not present

## 2022-05-26 DIAGNOSIS — M549 Dorsalgia, unspecified: Secondary | ICD-10-CM | POA: Diagnosis not present

## 2022-05-27 ENCOUNTER — Encounter: Payer: Self-pay | Admitting: Family Medicine

## 2022-05-27 DIAGNOSIS — M7918 Myalgia, other site: Secondary | ICD-10-CM | POA: Diagnosis not present

## 2022-05-28 NOTE — Telephone Encounter (Signed)
Left message for patient to call back to schedule appt with the possibility of 10am this morning.

## 2022-06-03 ENCOUNTER — Ambulatory Visit: Payer: Medicare Other | Admitting: Family Medicine

## 2022-06-04 DIAGNOSIS — M1991 Primary osteoarthritis, unspecified site: Secondary | ICD-10-CM | POA: Diagnosis not present

## 2022-07-02 NOTE — Progress Notes (Unsigned)
Traci [name] [place] [address] [place] Phone: [phone] Subjective:   Traci [name], am serving as a scribe for Dr. Hulan Saas.  I'm seeing this patient by the request  of:  Traci [name]., MD  CC: Low back pain  [id number]  05/15/2022 I am concerned the patient actually has some underlying spinal stenosis that could be contributing.  Discussed icing regimen and home exercises, though with the weakness noted at this time that we do need to consider the possibility of foot drop noted at this time.  I will concern that patient may have a nerve impingement that is fairly severe.  Patient also does have a history of multiple myeloma.  X-rays do show some sclerotic changes noted of the thoracic and lumbar spine.  Possible concerning for a occult fracture also noted in the T8 area.  Will get MRI secondary to this and the weakness noted.  No significant spondylolisthesis noted at the L5-S1 area we will need to monitor.  Depending on imaging will discuss different treatment options such as medications but will start gabapentin at this time.     Update 07/03/2022 Traci [name] is a 72 y.o. female coming in with complaint of L side lumbar spine pain. Patient states wants to go over imaging. Wants to know what should she do from here. Exercises, do's and don'ts, etc. MRI was on 05/26/2022  MRI of the lumbar spine does show advanced facet arthropathy mostly at the L4-L5 level with L4 nerve root impingement noted.     Past Medical History:  Diagnosis Date   ASHD (arteriosclerotic heart disease)    Mild septal hypertrophy, 2D ECHO 11/2006, Dr. Percival Spanish   Breast cancer [facility])    PMH of, Dr. Marylene Buerger, Dr. Truddie Coco  multilpe sites overlaping, ER +   Facet syndrome, lumbar    Dr. Lynann Bologna   Heart murmur    no problems   History of COVID-19 05/07/2019   Hyperlipidemia    Hyperlipidemia    Hypertension    Osteopenia    Skin cancer, basal cell     Dr. Amy Martinique   Past Surgical History:  Procedure Laterality Date   CATARACT EXTRACTION Right 06/2020   CHOLECYSTECTOMY  about 1993   COLONOSCOPY      X 3 negative (last 2008, due 2015), Dr. Olevia Perches   MASTECTOMY Left 11/2004   Dr. Marylene Buerger, also reduction of right breast and reconstruction on the left.   ROTATOR CUFF REPAIR Left    TONSILLECTOMY     TRANSESOPHAGEAL ECHOCARDIOGRAM  2012   Overall left ventricular systolic function was normal. Left ventricular ejection fraction was estimated, range being 55% to 60%. There were no left ventricular regional wall motional abnormalities. Left ventricular wall thickness was mildly increased. There was mild focal basal septal hypertrophy.   WISDOM TOOTH EXTRACTION     Social History   Socioeconomic History   Marital status: Married    Spouse name: Not on file   Number of children: Not on file   Years of education: Not on file   Highest education level: Not on file  Occupational History   Occupation: Retired    Fish farm manager: UNEMPLOYED  Tobacco Use   Smoking status: Former    Packs/day: 1.00    Years: 10.00    Total pack years: 10.00    Types: Cigarettes    Quit date: 05/06/1978    Years since quitting: 44.1   Smokeless tobacco: Never  Tobacco comments:    smoked [place], up to 1 ppd  Vaping Use   Vaping Use: Never used  Substance and Sexual Activity   Alcohol use: Yes    Alcohol/week: 4.0 standard drinks of alcohol    Types: 4 Glasses of wine per week   Drug use: No   Sexual activity: Yes    Partners: Male    Birth control/protection: Post-menopausal  Other Topics Concern   Not on file  Social History Narrative   Married   Regular exercise: Yes, high level CVE, walks 2 mpd 5x/ week & gym   No specific diet   Social Determinants of Health   Financial Resource Strain: Not on file  Food Insecurity: Not on file  Transportation Needs: Not on file  Physical Activity: Not on file  Stress: Not on file  Social  Connections: Not on file   Allergies  Allergen Reactions   Shellfish Allergy     2014 Angioedema of the lips, pt had it since then with no reaction   Codeine     Other reaction(s): very sensitive   Hydrocodone     nausea   Vicodin [Hydrocodone-Acetaminophen]     nausea   Family History  Problem Relation Age of Onset   Hypertension Mother    Coronary artery disease Mother        no MI   Lung cancer Mother        smoker   Colon cancer Father 33   Coronary artery disease Father        no MI   Prostate cancer Father    Diabetes Father    Hypertension Sister    Cancer Maternal Grandfather        GI   Diabetes Paternal Grandfather    Stroke Neg Hx    Esophageal cancer Neg Hx    Stomach cancer Neg Hx    Rectal cancer Neg Hx     Current Outpatient Medications (Endocrine & Metabolic):    denosumab (PROLIA) 60 MG/ML SOLN injection, Inject 60 mg into the skin every 6 (six) months. Administer in upper arm, thigh, or abdomen  Current Outpatient Medications (Cardiovascular):    amLODipine (NORVASC) 10 MG tablet, Take 1 tablet (10 mg total) by mouth daily.   benazepril (LOTENSIN) 40 MG tablet, Take 1 tablet (40 mg total) by mouth daily.   rosuvastatin (CRESTOR) 10 MG tablet, Take 10 mg by mouth daily.    Current Outpatient Medications (Hematological):    iron polysaccharides (NIFEREX) 150 MG capsule, Take 1 capsule (150 mg total) by mouth daily.  Current Outpatient Medications (Other):    acyclovir (ZOVIRAX) 400 MG tablet, TAKE 1 TABLET BY MOUTH TWICE A DAY   B Complex Vitamins (VITAMIN B COMPLEX) CAPS, TAKE 1 CAPSULE BY MOUTH EVERY DAY   cholecalciferol (VITAMIN D3) 25 MCG (1000 UNIT) tablet, Take 2 tablets (2,000 Units total) by mouth daily.   DULoxetine (CYMBALTA) 30 MG capsule, TAKE 1 CAPSULE BY MOUTH EVERY DAY (Patient not taking: Reported on 05/15/2022)   gabapentin (NEURONTIN) 100 MG capsule, Take 1 capsule (100 mg total) by mouth 2 (two) times daily.   Reviewed prior  external information including notes and imaging from  primary care provider As well as notes that were available from care everywhere and other healthcare systems.  Past medical history, social, surgical and family history all reviewed in electronic medical record.  No pertanent information unless stated regarding to the chief complaint.   Review of Systems:  No headache, visual changes, nausea, vomiting, diarrhea, constipation, dizziness, abdominal pain, skin rash, fevers, chills, night sweats, weight loss, swollen lymph nodes, body aches, joint swelling, chest pain, shortness of breath, mood changes. POSITIVE muscle aches but states that they are improving  Objective  Blood pressure 124/82, pulse 67, height '5\' 3"'$  (1.6 m), weight 124 lb (56.2 kg), last menstrual period 05/07/1999, SpO2 97 %.   General: No apparent distress alert and oriented x3 mood and affect normal, dressed appropriately.  HEENT: Pupils equal, extraocular movements intact  Respiratory: Patient's speak in full sentences and does not appear short of breath  Cardiovascular: No lower extremity edema, non tender, no erythema  Patient is comfortable overall, negative straight leg test.  Negative FABER test. Chair without any significant difficulty     Impression and Recommendations:    The above documentation has been reviewed and is accurate and complete Lyndal Pulley, DO

## 2022-07-03 ENCOUNTER — Ambulatory Visit (INDEPENDENT_AMBULATORY_CARE_PROVIDER_SITE_OTHER): Payer: Medicare Other | Admitting: Family Medicine

## 2022-07-03 VITALS — BP 124/82 | HR 67 | Ht 63.0 in | Wt 124.0 lb

## 2022-07-03 DIAGNOSIS — M47816 Spondylosis without myelopathy or radiculopathy, lumbar region: Secondary | ICD-10-CM | POA: Diagnosis not present

## 2022-07-03 NOTE — Patient Instructions (Signed)
Exercises Be proud I will look up supplement See me in 2-3 months

## 2022-07-03 NOTE — Assessment & Plan Note (Signed)
Facet arthropathy that is fairly significant.  After discussing with patient for quite long time going over the imaging patient has elected to try conservative therapy.  Feels like she has been making some improvement.  Discussed posture and ergonomics, discussed which activities to do and which ones to avoid.  Patient will avoid repetitive extension where possible.  Discussed icing regimen.  Follow-up with me again in 8 weeks total time with patient 31 minutes

## 2022-07-05 DIAGNOSIS — M19011 Primary osteoarthritis, right shoulder: Secondary | ICD-10-CM | POA: Diagnosis not present

## 2022-07-08 DIAGNOSIS — M7918 Myalgia, other site: Secondary | ICD-10-CM | POA: Diagnosis not present

## 2022-07-08 DIAGNOSIS — M25551 Pain in right hip: Secondary | ICD-10-CM | POA: Diagnosis not present

## 2022-07-09 DIAGNOSIS — Z91013 Allergy to seafood: Secondary | ICD-10-CM | POA: Diagnosis not present

## 2022-07-09 DIAGNOSIS — Z8616 Personal history of COVID-19: Secondary | ICD-10-CM | POA: Diagnosis not present

## 2022-07-09 DIAGNOSIS — D8989 Other specified disorders involving the immune mechanism, not elsewhere classified: Secondary | ICD-10-CM | POA: Diagnosis not present

## 2022-07-09 DIAGNOSIS — C50919 Malignant neoplasm of unspecified site of unspecified female breast: Secondary | ICD-10-CM | POA: Diagnosis not present

## 2022-07-09 DIAGNOSIS — Z79899 Other long term (current) drug therapy: Secondary | ICD-10-CM | POA: Diagnosis not present

## 2022-07-09 DIAGNOSIS — G4700 Insomnia, unspecified: Secondary | ICD-10-CM | POA: Diagnosis not present

## 2022-07-09 DIAGNOSIS — E7800 Pure hypercholesterolemia, unspecified: Secondary | ICD-10-CM | POA: Diagnosis not present

## 2022-07-09 DIAGNOSIS — Z8042 Family history of malignant neoplasm of prostate: Secondary | ICD-10-CM | POA: Diagnosis not present

## 2022-07-09 DIAGNOSIS — Z87891 Personal history of nicotine dependence: Secondary | ICD-10-CM | POA: Diagnosis not present

## 2022-07-09 DIAGNOSIS — I129 Hypertensive chronic kidney disease with stage 1 through stage 4 chronic kidney disease, or unspecified chronic kidney disease: Secondary | ICD-10-CM | POA: Diagnosis not present

## 2022-07-09 DIAGNOSIS — Z885 Allergy status to narcotic agent status: Secondary | ICD-10-CM | POA: Diagnosis not present

## 2022-07-09 DIAGNOSIS — N183 Chronic kidney disease, stage 3 unspecified: Secondary | ICD-10-CM | POA: Diagnosis not present

## 2022-07-16 ENCOUNTER — Encounter: Payer: Self-pay | Admitting: Hematology

## 2022-07-19 ENCOUNTER — Ambulatory Visit: Payer: Medicare Other | Admitting: Cardiology

## 2022-07-25 ENCOUNTER — Telehealth: Payer: Self-pay | Admitting: Hematology

## 2022-07-25 NOTE — Telephone Encounter (Signed)
Patient called to have appointments in June rescheduled due to travel conflict. Patient rescheduled and notified.

## 2022-08-06 ENCOUNTER — Other Ambulatory Visit: Payer: Self-pay

## 2022-08-12 DIAGNOSIS — M7918 Myalgia, other site: Secondary | ICD-10-CM | POA: Diagnosis not present

## 2022-08-12 DIAGNOSIS — M25551 Pain in right hip: Secondary | ICD-10-CM | POA: Diagnosis not present

## 2022-08-21 DIAGNOSIS — D8989 Other specified disorders involving the immune mechanism, not elsewhere classified: Secondary | ICD-10-CM | POA: Diagnosis not present

## 2022-08-21 DIAGNOSIS — M25562 Pain in left knee: Secondary | ICD-10-CM | POA: Diagnosis not present

## 2022-08-21 DIAGNOSIS — D801 Nonfamilial hypogammaglobulinemia: Secondary | ICD-10-CM | POA: Diagnosis not present

## 2022-08-21 DIAGNOSIS — N1831 Chronic kidney disease, stage 3a: Secondary | ICD-10-CM | POA: Diagnosis not present

## 2022-08-22 DIAGNOSIS — M25551 Pain in right hip: Secondary | ICD-10-CM | POA: Diagnosis not present

## 2022-08-22 DIAGNOSIS — M7918 Myalgia, other site: Secondary | ICD-10-CM | POA: Diagnosis not present

## 2022-08-25 ENCOUNTER — Encounter: Payer: Self-pay | Admitting: Cardiology

## 2022-08-25 DIAGNOSIS — I517 Cardiomegaly: Secondary | ICD-10-CM | POA: Insufficient documentation

## 2022-08-25 NOTE — Progress Notes (Unsigned)
  Cardiology Office Note:   Date:  08/26/2022  ID:  Traci [name], DOB 03/10/1951, MRN [id number]  History of Present Illness:   Traci [name] is a 71 y.o. female who presents for evaluation of hypertrophic cardiomyopathy.    The MRI is consistent with moderate basal septal hypertrophy.  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  She is being treated at [facility] and currently is in remission.  She had diffuse muscular pain.  She has had an extensive workup of this.  She is also had some renal insufficiency and is followed by nephrology.  No clear etiology for the diffuse muscle pain has been identified.  This has limited her physical activities.  She still gets on the bike occasionally.  She walks.  She is not having any new cardiovascular symptoms.  She denies any new shortness of breath, PND or orthopnea.  She has had no new palpitations, presyncope or syncope.  She has had pain behind her left knee.  There is been some swelling there.  The etiology of this has not been clear.  ROS: As stated in the HPI and negative for all other systems.  Studies Reviewed:    EKG: Sinus rhythm, rate 65, axis within normal limits, intervals within normal limits, no acute ST-T wave changes.   Risk Assessment/Calculations:     Physical Exam:   VS:  BP 130/76 (BP Location: Left Arm, Patient Position: Sitting, Cuff Size: Normal)   Pulse 65   Ht 5' 3" (1.6 m)   Wt 128 lb 3.2 oz (58.2 kg)   LMP 05/07/1999 (LMP Unknown)   SpO2 97%   BMI 22.71 kg/m    Wt Readings from Last 3 Encounters:  08/26/22 128 lb 3.2 oz (58.2 kg)  07/03/22 124 lb (56.2 kg)  05/15/22 122 lb (55.3 kg)     GEN: Well nourished, well developed in no acute distress NECK: No JVD; No carotid bruits CARDIAC: RRR, no murmurs, rubs, gallops RESPIRATORY:  Clear to auscultation without rales, wheezing or rhonchi  ABDOMEN: Soft, no edema, mild swelling left popliteal fossa EXTREMITIES:  No edema; No deformity   ASSESSMENT AND  PLAN:   HYPERTROPHIC CARDIOMYOPATHY -   at this point I think an echocardiogram next year would be reasonable.  We talked again about the fact that the LCD can be associated with nonamyloid light chain deposition's in organs including the heart she is getting this managed appropriately at [facility].  HYPERTENSION - The blood pressure is at target and I reviewed multiple readings.  No change in therapy.  KNEE PAIN: She does have some mild swelling behind the left knee.  I am going to scan for possible Baker's cyst.        Signed, Amran Malter, MD   

## 2022-08-26 ENCOUNTER — Ambulatory Visit: Payer: Medicare Other | Attending: Cardiology | Admitting: Cardiology

## 2022-08-26 ENCOUNTER — Encounter: Payer: Self-pay | Admitting: Cardiology

## 2022-08-26 VITALS — BP 130/76 | HR 65 | Ht 63.0 in | Wt 128.2 lb

## 2022-08-26 DIAGNOSIS — I517 Cardiomegaly: Secondary | ICD-10-CM | POA: Diagnosis not present

## 2022-08-26 DIAGNOSIS — I10 Essential (primary) hypertension: Secondary | ICD-10-CM | POA: Insufficient documentation

## 2022-08-26 NOTE — Progress Notes (Signed)
  Cardiology Office Note:   Date:  08/26/2022  ID:  Traci [name], DOB Jan 12, 1951, MRN [id number]  History of Present Illness:   Traci [name] is a 72 y.o. female who presents for evaluation of hypertrophic cardiomyopathy.    The MRI is consistent with moderate basal septal hypertrophy.  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  She is being treated at [facility] and currently is in remission.  She had diffuse muscular pain.  She has had an extensive workup of this.  She is also had some renal insufficiency and is followed by nephrology.  No clear etiology for the diffuse muscle pain has been identified.  This has limited her physical activities.  She still gets on the bike occasionally.  She walks.  She is not having any new cardiovascular symptoms.  She denies any new shortness of breath, PND or orthopnea.  She has had no new palpitations, presyncope or syncope.  She has had pain behind her left knee.  There is been some swelling there.  The etiology of this has not been clear.  ROS: As stated in the HPI and negative for all other systems.  Studies Reviewed:    EKG: Sinus rhythm, rate 65, axis within normal limits, intervals within normal limits, no acute ST-T wave changes.   Risk Assessment/Calculations:     Physical Exam:   VS:  BP 130/76 (BP Location: Left Arm, Patient Position: Sitting, Cuff Size: Normal)   Pulse 65   Ht  (1.6 m)   Wt 128 lb 3.2 oz (58.2 kg)   LMP 05/07/1999 (LMP Unknown)   SpO2 97%   BMI 22.71 kg/m    Wt Readings from Last 3 Encounters:  08/26/22 128 lb 3.2 oz (58.2 kg)  07/03/22 124 lb (56.2 kg)  05/15/22 122 lb (55.3 kg)     GEN: Well nourished, well developed in no acute distress NECK: No JVD; No carotid bruits CARDIAC: RRR, no murmurs, rubs, gallops RESPIRATORY:  Clear to auscultation without rales, wheezing or rhonchi  ABDOMEN: Soft, no edema, mild swelling left popliteal fossa EXTREMITIES:  No edema; No deformity   ASSESSMENT AND  PLAN:   HYPERTROPHIC CARDIOMYOPATHY -   at this point I think an echocardiogram next year would be reasonable.  We talked again about the fact that the LCD can be associated with nonamyloid light chain deposition's in organs including the heart she is getting this managed appropriately at [facility].  HYPERTENSION - The blood pressure is at target and I reviewed multiple readings.  No change in therapy.  KNEE PAIN: She does have some mild swelling behind the left knee.  I am going to scan for possible Baker's cyst.        Signed, Rollene Rotunda, MD

## 2022-08-26 NOTE — Patient Instructions (Signed)
Medication Instructions:  Your physician recommends that you continue on your current medications as directed. Please refer to the Current Medication list given to you today.  *If you need a refill on your cardiac medications before your next appointment, please call your pharmacy*   Testing/Procedures: Your physician has requested that you have a lower extremity venous duplex. This test is an ultrasound of the veins in the legs. It looks at venous blood flow that carries blood from the heart to the legs. Allow one hour for a Lower Venous exam. There are no restrictions or special instructions. This will be done at [address] [facility]   Your physician has requested that you have an echocardiogram. Echocardiography is a painless test that uses sound waves to create images of your heart. It provides your doctor with information about the size and shape of your heart and how well your heart's chambers and valves are working. This procedure takes approximately one hour. There are no restrictions for this procedure. Please do NOT wear cologne, perfume, aftershave, or lotions (deodorant is allowed). Please arrive 15 minutes prior to your appointment time. This procedure will be done at [address] **To be done in March 2025**   Follow-Up: At [facility], you and your health needs are our priority.  As part of our continuing mission to provide you with exceptional heart care, we have created designated Provider Care Teams.  These Care Teams include your primary Cardiologist (physician) and Advanced Practice Providers (APPs -  Physician Assistants and Nurse Practitioners) who all work together to provide you with the care you need, when you need it.  We recommend signing up for the patient portal called "MyChart".  Sign up information is provided on this After Visit Summary.  MyChart is used to connect with patients for Virtual Visits (Telemedicine).  Patients are able to  view lab/test results, encounter notes, upcoming appointments, etc.  Non-urgent messages can be sent to your provider as well.   To learn more about what you can do with MyChart, go to [email or web address].    Your next appointment:   12 month(s)  Provider:   Rollene Rotunda, MD

## 2022-08-27 ENCOUNTER — Other Ambulatory Visit: Payer: Self-pay

## 2022-09-09 ENCOUNTER — Ambulatory Visit (HOSPITAL_COMMUNITY)
Admission: RE | Admit: 2022-09-09 | Discharge: 2022-09-09 | Disposition: A | Discharge status: Home / Self Care | Payer: Medicare Other | Source: Ambulatory Visit | Attending: Cardiology | Admitting: Cardiology

## 2022-09-09 DIAGNOSIS — I517 Cardiomegaly: Secondary | ICD-10-CM | POA: Insufficient documentation

## 2022-09-09 DIAGNOSIS — I10 Essential (primary) hypertension: Secondary | ICD-10-CM | POA: Diagnosis not present

## 2022-09-10 ENCOUNTER — Encounter: Payer: Self-pay | Admitting: Cardiology

## 2022-09-17 DIAGNOSIS — M25551 Pain in right hip: Secondary | ICD-10-CM | POA: Diagnosis not present

## 2022-09-17 DIAGNOSIS — M7918 Myalgia, other site: Secondary | ICD-10-CM | POA: Diagnosis not present

## 2022-09-17 IMAGING — CT NM PET TUM IMG INITIAL (PI) WHOLE BODY
1 of 7 series · 3 of 25 positions shown · non-contrast
Comparison: Bone scan 01/29/2021 and CT chest abdomen pelvis
07/31/2020.

CLINICAL DATA: Initial treatment strategy for Kaki Jim
proteinuria.

EXAM:
NUCLEAR MEDICINE PET WHOLE BODY
TECHNIQUE: 5.9 mCi F-18 FDG was injected intravenously. Full-ring PET imaging
was performed from the head to foot after the radiotracer. CT data
was obtained and used for attenuation correction and anatomic
localization.
Fasting blood glucose: 88 mg/dl

[Series 4: ct wb 5.0 hd_fov · axial · 5.0mm · 1.52mm/px · z∈[-178,+658]mm · 3 of 419 slices shown]
[im 105/419  soft-tissue]
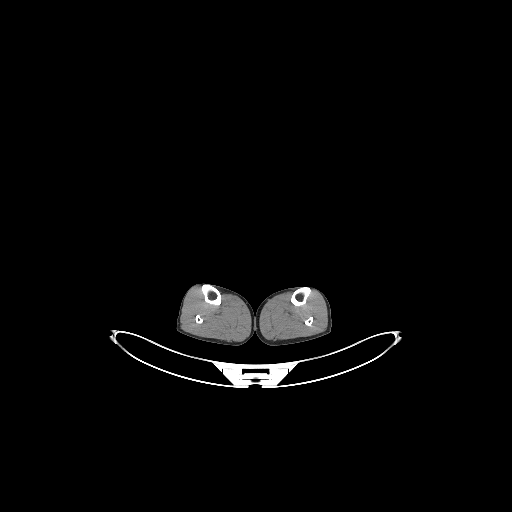
[im 210/419  soft-tissue]
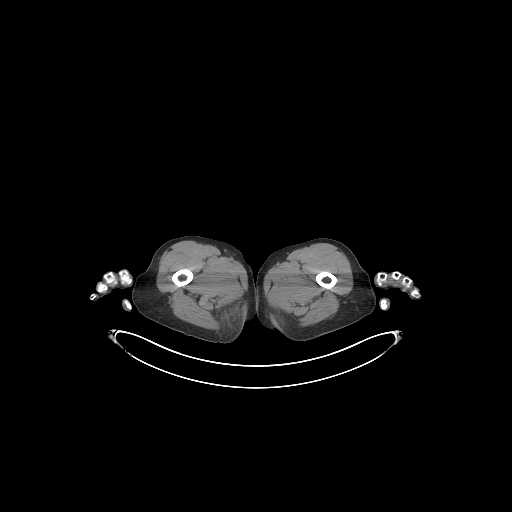
[im 314/419  soft-tissue]
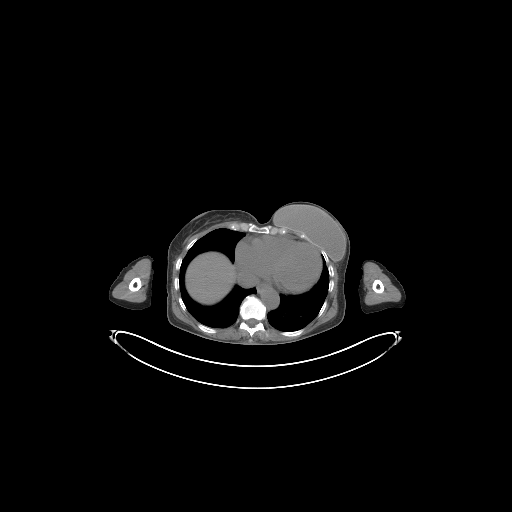

[3 of 25 positions shown; findings below may reference images not displayed]

FINDINGS: Mediastinal blood pool activity: SUV max

HEAD/NECK:

No abnormal hypermetabolism.

Incidental CT findings:

None.

CHEST:

No hypermetabolic mediastinal hilar or axillary nodes.

Incidental CT findings:

Aberrant right subclavian artery. Atherosclerotic calcification of
the aorta, aortic valve and coronary arteries. Heart is enlarged.
Decreased attenuation of the intravascular compartment is indicative
of anemia. No pericardial or pleural effusion.

ABDOMEN/PELVIS:

No abnormal hypermetabolism in the liver, adrenal glands, spleen or
pancreas. No hypermetabolic lymph nodes.

Incidental CT findings:

Liver is unremarkable. Cholecystectomy. Adrenal glands are
unremarkable. Punctate stones in the kidneys bilaterally. Spleen,
pancreas, stomach and bowel are grossly unremarkable.
Atherosclerotic calcification of the aorta.

SKELETON:

No abnormal osseous hypermetabolism.

Incidental CT findings:

Degenerative changes in the spine and shoulders.

EXTREMITIES:

No abnormal hypermetabolism.

Incidental CT findings:

None.
IMPRESSION: 1. No evidence of hypermetabolic malignancy.
2. Punctate bilateral renal stones.
3. Aortic atherosclerosis ([id number]). Coronary artery
calcification.

## 2022-09-18 ENCOUNTER — Ambulatory Visit: Payer: Medicare Other | Admitting: Family Medicine

## 2022-09-24 DIAGNOSIS — M810 Age-related osteoporosis without current pathological fracture: Secondary | ICD-10-CM | POA: Diagnosis not present

## 2022-09-26 DIAGNOSIS — M810 Age-related osteoporosis without current pathological fracture: Secondary | ICD-10-CM | POA: Diagnosis not present

## 2022-10-07 ENCOUNTER — Other Ambulatory Visit: Payer: Medicare Other

## 2022-10-11 ENCOUNTER — Other Ambulatory Visit: Payer: Self-pay

## 2022-10-11 ENCOUNTER — Inpatient Hospital Stay: Payer: Medicare Other | Attending: Hematology

## 2022-10-11 DIAGNOSIS — C9000 Multiple myeloma not having achieved remission: Secondary | ICD-10-CM | POA: Insufficient documentation

## 2022-10-11 DIAGNOSIS — M25551 Pain in right hip: Secondary | ICD-10-CM | POA: Diagnosis not present

## 2022-10-11 DIAGNOSIS — M7918 Myalgia, other site: Secondary | ICD-10-CM | POA: Diagnosis not present

## 2022-10-11 LAB — CBC WITH DIFFERENTIAL (CANCER CENTER ONLY)
Abs Immature Granulocytes: 0.02 10*3/uL (ref 0.00–0.07)
Basophils Absolute: 0.1 10*3/uL (ref 0.0–0.1)
Basophils Relative: 1 %
Eosinophils Absolute: 0.1 10*3/uL (ref 0.0–0.5)
Eosinophils Relative: 2 %
HCT: 35.3 % — ABNORMAL LOW (ref 36.0–46.0)
Hemoglobin: 11.8 g/dL — ABNORMAL LOW (ref 12.0–15.0)
Immature Granulocytes: 0 %
Lymphocytes Relative: 16 %
Lymphs Abs: 1.1 10*3/uL (ref 0.7–4.0)
MCH: 32 pg (ref 26.0–34.0)
MCHC: 33.4 g/dL (ref 30.0–36.0)
MCV: 95.7 fL (ref 80.0–100.0)
Monocytes Absolute: 0.5 10*3/uL (ref 0.1–1.0)
Monocytes Relative: 8 %
Neutro Abs: 5 10*3/uL (ref 1.7–7.7)
Neutrophils Relative %: 73 %
Platelet Count: 251 10*3/uL (ref 150–400)
RBC: 3.69 MIL/uL — ABNORMAL LOW (ref 3.87–5.11)
RDW: 13.1 % (ref 11.5–15.5)
WBC Count: 6.8 10*3/uL (ref 4.0–10.5)
nRBC: 0 % (ref 0.0–0.2)

## 2022-10-11 LAB — CMP (CANCER CENTER ONLY)
ALT: 16 U/L (ref 0–44)
AST: 24 U/L (ref 15–41)
Albumin: 4.4 g/dL (ref 3.5–5.0)
Alkaline Phosphatase: 36 U/L — ABNORMAL LOW (ref 38–126)
Anion gap: 9 (ref 5–15)
BUN: 26 mg/dL — ABNORMAL HIGH (ref 8–23)
CO2: 26 mmol/L (ref 22–32)
Calcium: 9.1 mg/dL (ref 8.9–10.3)
Chloride: 104 mmol/L (ref 98–111)
Creatinine: 1.15 mg/dL — ABNORMAL HIGH (ref 0.44–1.00)
GFR, Estimated: 50 mL/min — ABNORMAL LOW (ref >60)
Glucose, Bld: 102 mg/dL — ABNORMAL HIGH (ref 70–99)
Potassium: 3.8 mmol/L (ref 3.5–5.1)
Sodium: 139 mmol/L (ref 135–145)
Total Bilirubin: 0.6 mg/dL (ref 0.3–1.2)
Total Protein: 6.7 g/dL (ref 6.5–8.1)

## 2022-10-14 ENCOUNTER — Ambulatory Visit: Payer: Medicare Other | Admitting: Hematology

## 2022-10-16 ENCOUNTER — Inpatient Hospital Stay: Payer: Medicare Other | Admitting: Hematology

## 2022-10-16 ENCOUNTER — Telehealth: Payer: Self-pay | Admitting: Hematology

## 2022-10-16 LAB — KAPPA/LAMBDA LIGHT CHAINS
Kappa free light chain: 5.7 mg/L (ref 3.3–19.4)
Kappa, lambda light chain ratio: 0.83 (ref 0.26–1.65)
Lambda free light chains: 6.9 mg/L (ref 5.7–26.3)

## 2022-10-16 NOTE — Progress Notes (Incomplete)
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  HISTORY OF PRESENTING ILLNESS:  Please see previous note for details on initial presentation  INTERVAL HISTORY  [name] is a 72 y.o. 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  .I connected with Traci [name] on 04/15/2022 at  8:40 AM EST by telephone visit and verified that I am speaking with the correct person using two identifiers.   Patient was last seen by me on 04/15/2022 and complained of generalized body aches.   I connected with Traci [name] on 10/16/22 at  8:40 AM EDT by telephone visit and verified that I am speaking with the correct person using two identifiers.   I discussed the limitations, risks, security and privacy concerns of performing an evaluation and management service by telemedicine and the availability of in-person appointments. I also discussed with the patient that there may be a patient responsible charge related to this service. The patient expressed understanding and agreed to proceed.   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 CBC showed WBC of 6.8K, hemoglobin of 11.8, and platelets of 251K. -   MEDICAL HISTORY:  Past Medical History:  Diagnosis Date   ASHD (arteriosclerotic heart disease)    Mild septal hypertrophy, 2D ECHO 11/2006, Dr. Antoine Poche   Breast cancer [facility])    PMH  of, Dr. Francina Ames, Dr. Donnie Coffin  multilpe sites overlaping, ER +   Facet syndrome, lumbar    Dr. Charlett Blake   Heart murmur    no problems   History of COVID-19 05/07/2019   Hyperlipidemia    Hyperlipidemia    Hypertension    Osteopenia    Skin cancer, basal cell    Dr. Amy Swaz[place]and     SURGICAL HISTORY: Past Surgical History:  Procedure Laterality Date   CATARACT EXTRACTION Right 06/2020   CHOLECYSTECTOMY  about 1993   COLONOSCOPY      X 3 negative (last 2008, due 2015), Dr. Juanda Chance   MASTECTOMY Left 11/2004   Dr. Francina Ames, also reduction of right breast and reconstruction on the left.   ROTATOR CUFF REPAIR Left    TONSILLECTOMY     TRANSESOPHAGEAL ECHOCARDIOGRAM  2012   Overall left ventricular systolic function was normal. Left ventricular ejection fraction was estimated, range being 55% to 60%. There were no left ventricular regional wall motional abnormalities. Left ventricular wall thickness was mildly increased. There was mild focal basal septal hypertrophy.   WISDOM TOOTH EXTRACTION      SOCIAL HISTORY: Social History   Socioeconomic History   Marital status: Married    Spouse name: Not on file   Number of children: Not on file   Years of education: Not on file   Highest education level: Not on file  Occupational History   Occupation: Retired    Associate Professor: UNEMPLOYED  Tobacco Use   Smoking status: Former    Packs/day: 1.00    Years:  10.00    Total pack years: 10.00    Types: Cigarettes    Quit date: 05/06/1978    Years since quitting: 43.9   Smokeless tobacco: Never   Tobacco comments:    smoked 1970- 1980, up to 1 ppd  Vaping Use   Vaping Use: Never used  Substance and Sexual Activity   Alcohol use: Yes    Alcohol/week: 4.0 standard drinks of alcohol    Types: 4 Glasses of wine per week   Drug use: No   Sexual activity: Yes    Partners: Male    Birth control/protection: Post-menopausal  Other Topics Concern   Not on file  Social History Narrative    Married   Regular exercise: Yes, high level CVE, walks 2 mpd 5x/ week & gym   No specific diet   Social Determinants of Health   Financial Resource Strain: Not on file  Food Insecurity: Not on file  Transportation Needs: Not on file  Physical Activity: Not on file  Stress: Not on file  Social Connections: Not on file  Intimate Partner Violence: Not on file    FAMILY HISTORY: Family History  Problem Relation Age of Onset   Hypertension Mother    Coronary artery disease Mother        no MI   Lung cancer Mother        smoker   Colon cancer Father 82   Coronary artery disease Father        no MI   Prostate cancer Father    Diabetes Father    Hypertension Sister    Cancer Maternal Grandfather        GI   Diabetes Paternal Grandfather    Stroke Neg Hx    Esophageal cancer Neg Hx    Stomach cancer Neg Hx    Rectal cancer Neg Hx     ALLERGIES:  is allergic to shellfish allergy, codeine, hydrocodone, and vicodin [hydrocodone-acetaminophen].  MEDICATIONS:  Current Outpatient Medications  Medication Sig Dispense Refill   acyclovir (ZOVIRAX) 400 MG tablet TAKE 1 TABLET BY MOUTH TWICE A DAY 180 tablet 1   amLODipine (NORVASC) 10 MG tablet Take 1 tablet (10 mg total) by mouth daily. 30 tablet 0   b complex vitamins capsule Take 1 capsule by mouth daily. 30 capsule 4   benazepril (LOTENSIN) 40 MG tablet Take 1 tablet (40 mg total) by mouth daily. 30 tablet 0   cholecalciferol (VITAMIN D3) 25 MCG (1000 UNIT) tablet Take 2 tablets (2,000 Units total) by mouth daily. 60 tablet 11   denosumab (PROLIA) 60 MG/ML SOLN injection Inject 60 mg into the skin every 6 (six) months. Administer in upper arm, thigh, or abdomen     DULoxetine (CYMBALTA) 30 MG capsule TAKE 1 CAPSULE BY MOUTH EVERY DAY 90 capsule 2   iron polysaccharides (NIFEREX) 150 MG capsule Take 1 capsule (150 mg total) by mouth daily. 30 capsule 2   LORazepam (ATIVAN) 0.5 MG tablet Take 1 tablet (0.5 mg total) by mouth every 6  (six) hours as needed (Nausea or vomiting). 30 tablet 0   ondansetron (ZOFRAN) 8 MG tablet Take 8 mg by mouth 30 to 60 min prior to Cytoxan administration then take 8 mg twice daily as needed for nausea and vomiting. 30 tablet 1   prochlorperazine (COMPAZINE) 10 MG tablet Take 1 tablet (10 mg total) by mouth every 6 (six) hours as needed (Nausea or vomiting). 30 tablet 1   No current facility-administered medications for  this visit.    REVIEW OF SYSTEMS:    10 Point review of Systems was done is negative except as noted above.   PHYSICAL EXAMINATION :  Telemedicine visit  LABORATORY DATA:  I have reviewed the data as listed  .    Latest Ref Rng & Units 04/09/2022   12:10 PM 02/25/2022   12:40 PM 02/11/2022   11:29 AM  CBC  WBC 4.0 - 10.5 K/uL 5.8  5.2  5.2   Hemoglobin 12.0 - 15.0 g/dL 16.1  09.6  04.5   Hematocrit 36.0 - 46.0 % 36.3  32.3  33.2   Platelets 150 - 400 K/uL 238  238  198   .CBC    Component Value Date/Time   WBC 5.8 04/09/2022 1210   WBC 5.2 02/25/2022 1240   RBC 3.90 04/09/2022 1210   HGB 12.6 04/09/2022 1210   HCT 36.3 04/09/2022 1210   PLT 238 04/09/2022 1210   MCV 93.1 04/09/2022 1210   MCH 32.3 04/09/2022 1210   MCHC 34.7 04/09/2022 1210   RDW 12.8 04/09/2022 1210   LYMPHSABS 1.4 04/09/2022 1210   MONOABS 0.6 04/09/2022 1210   EOSABS 0.1 04/09/2022 1210   BASOSABS 0.0 04/09/2022 1210       Latest Ref Rng & Units 04/09/2022   12:10 PM 02/25/2022   12:40 PM 02/11/2022   11:29 AM  CMP  Glucose 70 - 99 mg/dL 95  409  811   BUN 8 - 23 mg/dL 29  29  25    Creatinine 0.44 - 1.00 mg/dL 9.14  7.82  9.56   Sodium 135 - 145 mmol/L 138  139  139   Potassium 3.5 - 5.1 mmol/L 3.6  3.1  3.1   Chloride 98 - 111 mmol/L 103  102  104   CO2 22 - 32 mmol/L 25  29  29    Calcium 8.9 - 10.3 mg/dL 21.3  9.6  9.5   Total Protein 6.5 - 8.1 g/dL 6.9  6.3  6.8   Total Bilirubin 0.3 - 1.2 mg/dL 0.5  0.4  0.4   Alkaline Phos 38 - 126 U/L 39  36  38   AST 15 - 41 U/L  27  22  28    ALT 0 - 44 U/L 20  15  22      Surgical Pathology  CASE: [id number]  PATIENT: Traci [name]  Bone Marrow Report    Clinical History: Multiple myeloma , Bence-jones proteinuria  (BH)   DIAGNOSIS:   BONE MARROW, ASPIRATE, CLOT, CORE:  -Normocellular bone marrow with plasma cell neoplasm  -See comment   PERIPHERAL BLOOD:  -Normocytic-normochromic anemia   COMMENT:   The bone marrow is generally normocellular for age with trilineage  hematopoiesis.  In this background, the plasma cells are increased in  number representing 12% of cells associated with small clusters in the  clot and biopsy sections.  The plasma cells display lambda light chain  restriction consistent with plasma cell neoplasm.  Correlation with  cytogenetic and FISH studies is recommended.     RADIOGRAPHIC STUDIES: I have personally reviewed the radiological images as listed and agreed with the findings in the report. No results found.  ASSESSMENT & PLAN:   72 year old female with history of hypertrophic cardiomyopathy, hypertension, dyslipidemia with  #1  Smoldering myeloma with renal LCDD Bone marrow biopsy with 12% lambda restricted plasma cells PET CT scan with no hypermetabolic bone lesions Cytogenetics normal female karyotype Molecular cytogenetics atypical t (11;14) Component  Latest Ref Rng & Units 01/30/2021  IgG (Immunoglobin G), Serum     586 - 1,602 mg/dL 161  IgA     64 - 096 mg/dL 91  IgM (Immunoglobulin M), Srm     26 - 217 mg/dL 79  Total Protein ELP     6.0 - 8.5 g/dL 6.8  Albumin SerPl Elph-Mcnc     2.9 - 4.4 g/dL 4.3  Alpha 1     0.0 - 0.4 g/dL 0.2  Alpha2 Glob SerPl Elph-Mcnc     0.4 - 1.0 g/dL 0.7  B-Globulin SerPl Elph-Mcnc     0.7 - 1.3 g/dL 1.0  Gamma Glob SerPl Elph-Mcnc     0.4 - 1.8 g/dL 0.6  M Protein SerPl Elph-Mcnc     Not Observed g/dL Not Observed  Globulin, Total     2.2 - 3.9 g/dL 2.5  Albumin/Glob SerPl     0.7 - 1.7 1.8 (H)  IFE 1       Comment  Please Note (HCV):      Comment  Kappa free light chain     3.3 - 19.4 mg/L 11.8  Lambda free light chains     5.7 - 26.3 mg/L 480.8 (H)  Kappa, lambda light chain ratio     0.26 - 1.65 0.02 (L)   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  History of hypertension, dyslipidemia #5  Normocytic anemia with some worsening with hemoglobin down to 8.9 likely from Cytoxan.  Previous mild anemia likely related to chronic kidney disease and plasma cell dyscrasia.  Plan -Discussed labs from 04/09/2022 with the patient. CBC stable. CMP showed elevated creatine levels of 1.18. Light chain with normal ratio.  -Discussed that she might need to visit Neurologist for nerve conduction study, if her Rheumatology results are unrevealing.  -Discussed her recent urine test results, at [place], showed no abnormalities.  -Will follow up with Dr. Celene Skeen in [place] in about 3 months for lab.  -Will see [place] in 6 months.  -No prohibitive toxicities from Daratumumab at this time.  Follow-up ***  The total time spent in the appointment was *** minutes* .  All of the patient's questions were answered with apparent satisfaction. The patient knows to call the clinic with any problems, questions or concerns.   Wyvonnia Lora MD MS AAHIVMS [facility] Hematology/Oncology Physician [facility]  .*Total Encounter Time as defined by the Centers for Medicare and Medicaid Services includes, in addition to the face-to-face time of a patient visit (documented in the note above) non-face-to-face time: obtaining and reviewing outside history, ordering and reviewing medications, tests or procedures, care coordination (communications with other health care  professionals or caregivers) and documentation in the medical record.    I,Mitra Faeizi,acting as a Neurosurgeon for Wyvonnia Lora, MD.,have documented all relevant documentation on the behalf of Wyvonnia Lora, MD,as directed by  Wyvonnia Lora, MD while in the presence of Wyvonnia Lora, MD.  ***

## 2022-10-17 ENCOUNTER — Telehealth: Payer: Self-pay | Admitting: Hematology

## 2022-10-17 NOTE — Telephone Encounter (Signed)
Contacted patient to scheduled appointments. Patient is aware of appointments that are scheduled.   

## 2022-10-18 LAB — MULTIPLE MYELOMA PANEL, SERUM
Albumin SerPl Elph-Mcnc: 3.6 g/dL (ref 2.9–4.4)
Albumin/Glob SerPl: 1.7 (ref 0.7–1.7)
Alpha 1: 0.2 g/dL (ref 0.0–0.4)
Alpha2 Glob SerPl Elph-Mcnc: 0.8 g/dL (ref 0.4–1.0)
B-Globulin SerPl Elph-Mcnc: 0.9 g/dL (ref 0.7–1.3)
Gamma Glob SerPl Elph-Mcnc: 0.3 g/dL — ABNORMAL LOW (ref 0.4–1.8)
Globulin, Total: 2.2 g/dL (ref 2.2–3.9)
IgA: 25 mg/dL — ABNORMAL LOW (ref 64–422)
IgG (Immunoglobin G), Serum: 325 mg/dL — ABNORMAL LOW (ref 586–1602)
IgM (Immunoglobulin M), Srm: 44 mg/dL (ref 26–217)
Total Protein ELP: 5.8 g/dL — ABNORMAL LOW (ref 6.0–8.5)

## 2022-10-21 ENCOUNTER — Inpatient Hospital Stay (HOSPITAL_BASED_OUTPATIENT_CLINIC_OR_DEPARTMENT_OTHER): Payer: Medicare Other | Admitting: Hematology

## 2022-10-21 DIAGNOSIS — C9000 Multiple myeloma not having achieved remission: Secondary | ICD-10-CM | POA: Diagnosis not present

## 2022-10-21 DIAGNOSIS — D8989 Other specified disorders involving the immune mechanism, not elsewhere classified: Secondary | ICD-10-CM

## 2022-10-21 NOTE — Progress Notes (Signed)
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  HISTORY OF PRESENTING ILLNESS:  Please see previous note for details on initial presentation  INTERVAL HISTORY  [name] is a 72 y.o. 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   Patient was last seen by me on 04/15/2022 and complained of generalized body aches.  I connected with Chad Cordial on 10/21/22 at  8:40 AM EDT by telephone visit and verified that I am speaking with the correct person using two identifiers.   I discussed the limitations, risks, security and privacy concerns of performing an evaluation and management service by telemedicine and the availability of in-person appointments. I also discussed with the patient that there may be a patient responsible charge related to this service. The patient expressed understanding and agreed to proceed.   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  Today, patient is doing well overall and notes she has not been taking her Vitamin D supplements.   Patient denies any new acute symptoms, fever, chills, night sweats, new infection issues, new focal bone pains.   MEDICAL HISTORY:  Past Medical History:  Diagnosis Date   ASHD (arteriosclerotic heart disease)    Mild septal hypertrophy, 2D ECHO 11/2006, Dr. Antoine Poche   Breast cancer [facility])    PMH of, Dr. Francina Ames, Dr. Donnie Coffin  multilpe sites  overlaping, ER +   Facet syndrome, lumbar    Dr. Charlett Blake   Heart murmur    no problems   History of COVID-19 05/07/2019   Hyperlipidemia    Hyperlipidemia    Hypertension    Osteopenia    Skin cancer, basal cell    Dr. Amy [place]     SURGICAL HISTORY: Past Surgical History:  Procedure Laterality Date   CATARACT EXTRACTION Right 06/2020   CHOLECYSTECTOMY  about 1993   COLONOSCOPY      X 3 negative (last 2008, due 2015), Dr. Juanda Chance   MASTECTOMY Left 11/2004   Dr. Francina Ames, also reduction of right breast and reconstruction on the left.   ROTATOR CUFF REPAIR Left    TONSILLECTOMY     TRANSESOPHAGEAL ECHOCARDIOGRAM  2012   Overall left ventricular systolic function was normal. Left ventricular ejection fraction was estimated, range being 55% to 60%. There were no left ventricular regional wall motional abnormalities. Left ventricular wall thickness was mildly increased. There was mild focal basal septal hypertrophy.   WISDOM TOOTH EXTRACTION      SOCIAL HISTORY: Social History   Socioeconomic History   Marital status: Married    Spouse name: Not on file   Number of children: Not on file   Years of education: Not on file   Highest education level: Not on file  Occupational History   Occupation: Retired    Associate Professor: UNEMPLOYED  Tobacco Use   Smoking status: Former    Packs/day: 1.00    Years: 10.00    Total pack years: 10.00    Types: Cigarettes  Quit date: 05/06/1978    Years since quitting: 43.9   Smokeless tobacco: Never   Tobacco comments:    smoked 1970- 1980, up to 1 ppd  Vaping Use   Vaping Use: Never used  Substance and Sexual Activity   Alcohol use: Yes    Alcohol/week: 4.0 standard drinks of alcohol    Types: 4 Glasses of wine per week   Drug use: No   Sexual activity: Yes    Partners: Male    Birth control/protection: Post-menopausal  Other Topics Concern   Not on file  Social History Narrative   Married   Regular exercise: Yes, high level  CVE, walks 2 mpd 5x/ week & gym   No specific diet   Social Determinants of Health   Financial Resource Strain: Not on file  Food Insecurity: Not on file  Transportation Needs: Not on file  Physical Activity: Not on file  Stress: Not on file  Social Connections: Not on file  Intimate Partner Violence: Not on file    FAMILY HISTORY: Family History  Problem Relation Age of Onset   Hypertension Mother    Coronary artery disease Mother        no MI   Lung cancer Mother        smoker   Colon cancer Father 2   Coronary artery disease Father        no MI   Prostate cancer Father    Diabetes Father    Hypertension Sister    Cancer Maternal Grandfather        GI   Diabetes Paternal Grandfather    Stroke Neg Hx    Esophageal cancer Neg Hx    Stomach cancer Neg Hx    Rectal cancer Neg Hx     ALLERGIES:  is allergic to shellfish allergy, codeine, hydrocodone, and vicodin [hydrocodone-acetaminophen].  MEDICATIONS:  Current Outpatient Medications  Medication Sig Dispense Refill   acyclovir (ZOVIRAX) 400 MG tablet TAKE 1 TABLET BY MOUTH TWICE A DAY 180 tablet 1   amLODipine (NORVASC) 10 MG tablet Take 1 tablet (10 mg total) by mouth daily. 30 tablet 0   b complex vitamins capsule Take 1 capsule by mouth daily. 30 capsule 4   benazepril (LOTENSIN) 40 MG tablet Take 1 tablet (40 mg total) by mouth daily. 30 tablet 0   cholecalciferol (VITAMIN D3) 25 MCG (1000 UNIT) tablet Take 2 tablets (2,000 Units total) by mouth daily. 60 tablet 11   denosumab (PROLIA) 60 MG/ML SOLN injection Inject 60 mg into the skin every 6 (six) months. Administer in upper arm, thigh, or abdomen     DULoxetine (CYMBALTA) 30 MG capsule TAKE 1 CAPSULE BY MOUTH EVERY DAY 90 capsule 2   iron polysaccharides (NIFEREX) 150 MG capsule Take 1 capsule (150 mg total) by mouth daily. 30 capsule 2   LORazepam (ATIVAN) 0.5 MG tablet Take 1 tablet (0.5 mg total) by mouth every 6 (six) hours as needed (Nausea or vomiting).  30 tablet 0   ondansetron (ZOFRAN) 8 MG tablet Take 8 mg by mouth 30 to 60 min prior to Cytoxan administration then take 8 mg twice daily as needed for nausea and vomiting. 30 tablet 1   prochlorperazine (COMPAZINE) 10 MG tablet Take 1 tablet (10 mg total) by mouth every 6 (six) hours as needed (Nausea or vomiting). 30 tablet 1   No current facility-administered medications for this visit.    REVIEW OF SYSTEMS:   10 Point review of Systems was  done is negative except as noted above.  PHYSICAL EXAMINATION :  Telemedicine visit  LABORATORY DATA:  I have reviewed the data as listed  .    Latest Ref Rng & Units 10/11/2022   10:29 AM 04/09/2022   12:10 PM 02/25/2022   12:40 PM  CBC  WBC 4.0 - 10.5 K/uL 6.8  5.8  5.2   Hemoglobin 12.0 - 15.0 g/dL 16.1  09.6  04.5   Hematocrit 36.0 - 46.0 % 35.3  36.3  32.3   Platelets 150 - 400 K/uL 251  238  238   .CBC    Component Value Date/Time   WBC 6.8 10/11/2022 1029   WBC 5.2 02/25/2022 1240   RBC 3.69 (L) 10/11/2022 1029   HGB 11.8 (L) 10/11/2022 1029   HCT 35.3 (L) 10/11/2022 1029   PLT 251 10/11/2022 1029   MCV 95.7 10/11/2022 1029   MCH 32.0 10/11/2022 1029   MCHC 33.4 10/11/2022 1029   RDW 13.1 10/11/2022 1029   LYMPHSABS 1.1 10/11/2022 1029   MONOABS 0.5 10/11/2022 1029   EOSABS 0.1 10/11/2022 1029   BASOSABS 0.1 10/11/2022 1029       Latest Ref Rng & Units 10/11/2022   10:29 AM 04/09/2022   12:10 PM 02/25/2022   12:40 PM  CMP  Glucose 70 - 99 mg/dL 409  95  811   BUN 8 - 23 mg/dL 26  29  29    Creatinine 0.44 - 1.00 mg/dL 9.14  7.82  9.56   Sodium 135 - 145 mmol/L 139  138  139   Potassium 3.5 - 5.1 mmol/L 3.8  3.6  3.1   Chloride 98 - 111 mmol/L 104  103  102   CO2 22 - 32 mmol/L 26  25  29    Calcium 8.9 - 10.3 mg/dL 9.1  21.3  9.6   Total Protein 6.5 - 8.1 g/dL 6.7  6.9  6.3   Total Bilirubin 0.3 - 1.2 mg/dL 0.6  0.5  0.4   Alkaline Phos 38 - 126 U/L 36  39  36   AST 15 - 41 U/L 24  27  22    ALT 0 - 44 U/L 16  20   15           Surgical Pathology  CASE: [id number]  PATIENT: Taria Gulledge  Bone Marrow Report    Clinical History: Multiple myeloma , Bence-jones proteinuria  (BH)   DIAGNOSIS:   BONE MARROW, ASPIRATE, CLOT, CORE:  -Normocellular bone marrow with plasma cell neoplasm  -See comment   PERIPHERAL BLOOD:  -Normocytic-normochromic anemia   COMMENT:   The bone marrow is generally normocellular for age with trilineage  hematopoiesis.  In this background, the plasma cells are increased in  number representing 12% of cells associated with small clusters in the  clot and biopsy sections.  The plasma cells display lambda light chain  restriction consistent with plasma cell neoplasm.  Correlation with  cytogenetic and FISH studies is recommended.     RADIOGRAPHIC STUDIES: I have personally reviewed the radiological images as listed and agreed with the findings in the report. No results found.  ASSESSMENT & PLAN:   72 year old female with history of hypertrophic cardiomyopathy, hypertension, dyslipidemia with  #1  Smoldering myeloma with renal LCDD Bone marrow biopsy with 12% lambda restricted plasma cells PET CT scan with no hypermetabolic bone lesions Cytogenetics normal female karyotype Molecular cytogenetics atypical t (11;14) Component     Latest Ref Rng & Units 01/30/2021  IgG (Immunoglobin G), Serum     586 - 1,602 mg/dL 161  IgA     64 - 096 mg/dL 91  IgM (Immunoglobulin M), Srm     26 - 217 mg/dL 79  Total Protein ELP     6.0 - 8.5 g/dL 6.8  Albumin SerPl Elph-Mcnc     2.9 - 4.4 g/dL 4.3  Alpha 1     0.0 - 0.4 g/dL 0.2  Alpha2 Glob SerPl Elph-Mcnc     0.4 - 1.0 g/dL 0.7  B-Globulin SerPl Elph-Mcnc     0.7 - 1.3 g/dL 1.0  Gamma Glob SerPl Elph-Mcnc     0.4 - 1.8 g/dL 0.6  M Protein SerPl Elph-Mcnc     Not Observed g/dL Not Observed  Globulin, Total     2.2 - 3.9 g/dL 2.5  Albumin/Glob SerPl     0.7 - 1.7 1.8 (H)  IFE 1      Comment  Please Note  (HCV):      Comment  Kappa free light chain     3.3 - 19.4 mg/L 11.8  Lambda free light chains     5.7 - 26.3 mg/L 480.8 (H)  Kappa, lambda light chain ratio     0.26 - 1.65 0.02 (L)   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  History of hypertension, dyslipidemia #5  Normocytic anemia with some worsening with hemoglobin down to 8.9 likely from Cytoxan.  Previous mild anemia likely related to chronic kidney disease and plasma cell dyscrasia.  Plan  -Discussed labs from 10/11/2022, in detail with patient. CBC is stable -Mild anemia at 11.8 -CMP stable, creatinine unchanged, BUN slightly elevated at  -Myeloma labs showed no M protein and Igg levels of 325.  --Kappa/lambda labs WNL -Normal k/l light chain ratio -Discussed infection precautions -No indication of Ivig at this time at the absence of infections  -No indications of additional treatment of plasma cell dyscrasia at this time -No prohibitive toxicities from Daratumumab at this time. -Patient recommended to drink at least 64 oz of water daily -Recommended to continue Vitamin D supplements -Recommenced to take multivitamin RB complex  -Follow-up with labs in 6 months -Patient will follow-up with Dr. Leeanne Deed in 3 months with labs  Follow-up Labs in 22 weeks RTC with Dr Candise Che in 24 weeks  The total time spent in the appointment was 20 minutes* .  All of the patient's questions were answered with apparent satisfaction. The patient knows to call the clinic with any problems, questions or concerns.   Wyvonnia Lora MD MS AAHIVMS [facility] Hematology/Oncology Physician [facility]  .*Total Encounter Time as defined by the Centers for Medicare and Medicaid Services includes, in  addition to the face-to-face time of a patient visit (documented in the note above) non-face-to-face time: obtaining and reviewing outside history, ordering and reviewing medications, tests or procedures, care coordination (communications with other health care professionals or caregivers) and documentation in the medical record.   Alben Deeds Teague,acting as a Neurosurgeon for Wyvonnia Lora, MD.,have documented all relevant documentation on the behalf of Wyvonnia Lora, MD,as directed by  Wyvonnia Lora, MD while in the presence of Wyvonnia Lora, MD.  .I have reviewed the above documentation for accuracy and completeness, and I agree with the above. Rance Muir  Kermit Balo MD

## 2022-10-22 ENCOUNTER — Telehealth: Payer: Self-pay | Admitting: Hematology

## 2022-10-25 ENCOUNTER — Telehealth: Payer: Medicare Other | Admitting: Hematology

## 2022-10-27 ENCOUNTER — Encounter: Payer: Self-pay | Admitting: Hematology

## 2023-01-07 ENCOUNTER — Other Ambulatory Visit: Payer: Self-pay

## 2023-01-07 DIAGNOSIS — C9000 Multiple myeloma not having achieved remission: Secondary | ICD-10-CM

## 2023-01-08 ENCOUNTER — Other Ambulatory Visit: Payer: Self-pay

## 2023-01-08 ENCOUNTER — Inpatient Hospital Stay: Payer: Medicare Other | Attending: Hematology

## 2023-01-08 DIAGNOSIS — D649 Anemia, unspecified: Secondary | ICD-10-CM | POA: Diagnosis not present

## 2023-01-08 DIAGNOSIS — Z801 Family history of malignant neoplasm of trachea, bronchus and lung: Secondary | ICD-10-CM | POA: Insufficient documentation

## 2023-01-08 DIAGNOSIS — I10 Essential (primary) hypertension: Secondary | ICD-10-CM | POA: Insufficient documentation

## 2023-01-08 DIAGNOSIS — Z8249 Family history of ischemic heart disease and other diseases of the circulatory system: Secondary | ICD-10-CM | POA: Insufficient documentation

## 2023-01-08 DIAGNOSIS — Z87891 Personal history of nicotine dependence: Secondary | ICD-10-CM | POA: Diagnosis not present

## 2023-01-08 DIAGNOSIS — Z8042 Family history of malignant neoplasm of prostate: Secondary | ICD-10-CM | POA: Insufficient documentation

## 2023-01-08 DIAGNOSIS — Z833 Family history of diabetes mellitus: Secondary | ICD-10-CM | POA: Insufficient documentation

## 2023-01-08 DIAGNOSIS — Z800 Family history of malignant neoplasm of digestive organs: Secondary | ICD-10-CM | POA: Insufficient documentation

## 2023-01-08 DIAGNOSIS — Z79899 Other long term (current) drug therapy: Secondary | ICD-10-CM | POA: Insufficient documentation

## 2023-01-08 DIAGNOSIS — C9000 Multiple myeloma not having achieved remission: Secondary | ICD-10-CM | POA: Diagnosis not present

## 2023-01-08 LAB — CMP (CANCER CENTER ONLY)
ALT: 21 U/L (ref 0–44)
AST: 27 U/L (ref 15–41)
Albumin: 4.4 g/dL (ref 3.5–5.0)
Alkaline Phosphatase: 35 U/L — ABNORMAL LOW (ref 38–126)
Anion gap: 7 (ref 5–15)
BUN: 24 mg/dL — ABNORMAL HIGH (ref 8–23)
CO2: 29 mmol/L (ref 22–32)
Calcium: 9.5 mg/dL (ref 8.9–10.3)
Chloride: 104 mmol/L (ref 98–111)
Creatinine: 1.13 mg/dL — ABNORMAL HIGH (ref 0.44–1.00)
GFR, Estimated: 51 mL/min — ABNORMAL LOW (ref >60)
Glucose, Bld: 95 mg/dL (ref 70–99)
Potassium: 3.7 mmol/L (ref 3.5–5.1)
Sodium: 140 mmol/L (ref 135–145)
Total Bilirubin: 0.5 mg/dL (ref 0.3–1.2)
Total Protein: 6.5 g/dL (ref 6.5–8.1)

## 2023-01-08 LAB — CBC WITH DIFFERENTIAL (CANCER CENTER ONLY)
Abs Immature Granulocytes: 0.02 10*3/uL (ref 0.00–0.07)
Basophils Absolute: 0 10*3/uL (ref 0.0–0.1)
Basophils Relative: 1 %
Eosinophils Absolute: 0.1 10*3/uL (ref 0.0–0.5)
Eosinophils Relative: 2 %
HCT: 36.3 % (ref 36.0–46.0)
Hemoglobin: 12.2 g/dL (ref 12.0–15.0)
Immature Granulocytes: 0 %
Lymphocytes Relative: 24 %
Lymphs Abs: 1.8 10*3/uL (ref 0.7–4.0)
MCH: 31.9 pg (ref 26.0–34.0)
MCHC: 33.6 g/dL (ref 30.0–36.0)
MCV: 94.8 fL (ref 80.0–100.0)
Monocytes Absolute: 0.7 10*3/uL (ref 0.1–1.0)
Monocytes Relative: 10 %
Neutro Abs: 4.9 10*3/uL (ref 1.7–7.7)
Neutrophils Relative %: 63 %
Platelet Count: 231 10*3/uL (ref 150–400)
RBC: 3.83 MIL/uL — ABNORMAL LOW (ref 3.87–5.11)
RDW: 13.3 % (ref 11.5–15.5)
WBC Count: 7.6 10*3/uL (ref 4.0–10.5)
nRBC: 0 % (ref 0.0–0.2)

## 2023-01-09 LAB — KAPPA/LAMBDA LIGHT CHAINS
Kappa free light chain: 6.6 mg/L (ref 3.3–19.4)
Kappa, lambda light chain ratio: 1.08 (ref 0.26–1.65)
Lambda free light chains: 6.1 mg/L (ref 5.7–26.3)

## 2023-01-12 LAB — MULTIPLE MYELOMA PANEL, SERUM
Albumin SerPl Elph-Mcnc: 3.8 g/dL (ref 2.9–4.4)
Albumin/Glob SerPl: 1.8 — ABNORMAL HIGH (ref 0.7–1.7)
Alpha 1: 0.3 g/dL (ref 0.0–0.4)
Alpha2 Glob SerPl Elph-Mcnc: 0.8 g/dL (ref 0.4–1.0)
B-Globulin SerPl Elph-Mcnc: 0.8 g/dL (ref 0.7–1.3)
Gamma Glob SerPl Elph-Mcnc: 0.4 g/dL (ref 0.4–1.8)
Globulin, Total: 2.2 g/dL (ref 2.2–3.9)
IgA: 25 mg/dL — ABNORMAL LOW (ref 64–422)
IgG (Immunoglobin G), Serum: 351 mg/dL — ABNORMAL LOW (ref 586–1602)
IgM (Immunoglobulin M), Srm: 48 mg/dL (ref 26–217)
Total Protein ELP: 6 g/dL (ref 6.0–8.5)

## 2023-01-15 DIAGNOSIS — N182 Chronic kidney disease, stage 2 (mild): Secondary | ICD-10-CM | POA: Diagnosis not present

## 2023-01-15 DIAGNOSIS — Z79899 Other long term (current) drug therapy: Secondary | ICD-10-CM | POA: Diagnosis not present

## 2023-01-15 DIAGNOSIS — Z885 Allergy status to narcotic agent status: Secondary | ICD-10-CM | POA: Diagnosis not present

## 2023-01-15 DIAGNOSIS — D8989 Other specified disorders involving the immune mechanism, not elsewhere classified: Secondary | ICD-10-CM | POA: Diagnosis not present

## 2023-02-11 DIAGNOSIS — Z85828 Personal history of other malignant neoplasm of skin: Secondary | ICD-10-CM | POA: Diagnosis not present

## 2023-02-11 DIAGNOSIS — C4441 Basal cell carcinoma of skin of scalp and neck: Secondary | ICD-10-CM | POA: Diagnosis not present

## 2023-02-11 DIAGNOSIS — L905 Scar conditions and fibrosis of skin: Secondary | ICD-10-CM | POA: Diagnosis not present

## 2023-02-11 DIAGNOSIS — D485 Neoplasm of uncertain behavior of skin: Secondary | ICD-10-CM | POA: Diagnosis not present

## 2023-02-11 DIAGNOSIS — L821 Other seborrheic keratosis: Secondary | ICD-10-CM | POA: Diagnosis not present

## 2023-02-11 DIAGNOSIS — L570 Actinic keratosis: Secondary | ICD-10-CM | POA: Diagnosis not present

## 2023-03-04 DIAGNOSIS — N183 Chronic kidney disease, stage 3 unspecified: Secondary | ICD-10-CM | POA: Diagnosis not present

## 2023-03-04 DIAGNOSIS — Z1231 Encounter for screening mammogram for malignant neoplasm of breast: Secondary | ICD-10-CM | POA: Diagnosis not present

## 2023-03-19 ENCOUNTER — Telehealth: Payer: Self-pay | Admitting: Hematology

## 2023-03-20 ENCOUNTER — Telehealth: Payer: Self-pay | Admitting: Hematology

## 2023-03-26 ENCOUNTER — Encounter: Payer: Self-pay | Admitting: Hematology

## 2023-04-11 ENCOUNTER — Other Ambulatory Visit: Payer: Self-pay

## 2023-04-11 ENCOUNTER — Other Ambulatory Visit: Payer: Medicare Other

## 2023-04-11 DIAGNOSIS — C9000 Multiple myeloma not having achieved remission: Secondary | ICD-10-CM

## 2023-04-14 ENCOUNTER — Inpatient Hospital Stay: Payer: Medicare Other | Attending: Hematology

## 2023-04-14 DIAGNOSIS — Z79899 Other long term (current) drug therapy: Secondary | ICD-10-CM | POA: Diagnosis not present

## 2023-04-14 DIAGNOSIS — N189 Chronic kidney disease, unspecified: Secondary | ICD-10-CM | POA: Diagnosis not present

## 2023-04-14 DIAGNOSIS — M8580 Other specified disorders of bone density and structure, unspecified site: Secondary | ICD-10-CM | POA: Insufficient documentation

## 2023-04-14 DIAGNOSIS — Z87891 Personal history of nicotine dependence: Secondary | ICD-10-CM | POA: Diagnosis not present

## 2023-04-14 DIAGNOSIS — Z8249 Family history of ischemic heart disease and other diseases of the circulatory system: Secondary | ICD-10-CM | POA: Insufficient documentation

## 2023-04-14 DIAGNOSIS — Z85828 Personal history of other malignant neoplasm of skin: Secondary | ICD-10-CM | POA: Diagnosis not present

## 2023-04-14 DIAGNOSIS — Z8616 Personal history of COVID-19: Secondary | ICD-10-CM | POA: Insufficient documentation

## 2023-04-14 DIAGNOSIS — Z800 Family history of malignant neoplasm of digestive organs: Secondary | ICD-10-CM | POA: Diagnosis not present

## 2023-04-14 DIAGNOSIS — Z833 Family history of diabetes mellitus: Secondary | ICD-10-CM | POA: Diagnosis not present

## 2023-04-14 DIAGNOSIS — N1831 Chronic kidney disease, stage 3a: Secondary | ICD-10-CM | POA: Diagnosis not present

## 2023-04-14 DIAGNOSIS — C9000 Multiple myeloma not having achieved remission: Secondary | ICD-10-CM | POA: Diagnosis not present

## 2023-04-14 DIAGNOSIS — E785 Hyperlipidemia, unspecified: Secondary | ICD-10-CM | POA: Insufficient documentation

## 2023-04-14 DIAGNOSIS — D649 Anemia, unspecified: Secondary | ICD-10-CM | POA: Diagnosis not present

## 2023-04-14 DIAGNOSIS — Z9012 Acquired absence of left breast and nipple: Secondary | ICD-10-CM | POA: Diagnosis not present

## 2023-04-14 DIAGNOSIS — R7301 Impaired fasting glucose: Secondary | ICD-10-CM | POA: Diagnosis not present

## 2023-04-14 DIAGNOSIS — I129 Hypertensive chronic kidney disease with stage 1 through stage 4 chronic kidney disease, or unspecified chronic kidney disease: Secondary | ICD-10-CM | POA: Insufficient documentation

## 2023-04-14 DIAGNOSIS — M25559 Pain in unspecified hip: Secondary | ICD-10-CM | POA: Insufficient documentation

## 2023-04-14 DIAGNOSIS — Z885 Allergy status to narcotic agent status: Secondary | ICD-10-CM | POA: Diagnosis not present

## 2023-04-14 DIAGNOSIS — I422 Other hypertrophic cardiomyopathy: Secondary | ICD-10-CM | POA: Diagnosis not present

## 2023-04-14 DIAGNOSIS — Z9049 Acquired absence of other specified parts of digestive tract: Secondary | ICD-10-CM | POA: Insufficient documentation

## 2023-04-14 DIAGNOSIS — G8929 Other chronic pain: Secondary | ICD-10-CM | POA: Diagnosis not present

## 2023-04-14 DIAGNOSIS — Z801 Family history of malignant neoplasm of trachea, bronchus and lung: Secondary | ICD-10-CM | POA: Diagnosis not present

## 2023-04-14 DIAGNOSIS — M810 Age-related osteoporosis without current pathological fracture: Secondary | ICD-10-CM | POA: Diagnosis not present

## 2023-04-14 DIAGNOSIS — Z8042 Family history of malignant neoplasm of prostate: Secondary | ICD-10-CM | POA: Insufficient documentation

## 2023-04-14 LAB — CMP (CANCER CENTER ONLY)
ALT: 15 U/L (ref 0–44)
AST: 25 U/L (ref 15–41)
Albumin: 4.5 g/dL (ref 3.5–5.0)
Alkaline Phosphatase: 36 U/L — ABNORMAL LOW (ref 38–126)
Anion gap: 8 (ref 5–15)
BUN: 19 mg/dL (ref 8–23)
CO2: 26 mmol/L (ref 22–32)
Calcium: 9.3 mg/dL (ref 8.9–10.3)
Chloride: 104 mmol/L (ref 98–111)
Creatinine: 1.08 mg/dL — ABNORMAL HIGH (ref 0.44–1.00)
GFR, Estimated: 54 mL/min — ABNORMAL LOW (ref >60)
Glucose, Bld: 99 mg/dL (ref 70–99)
Potassium: 3.7 mmol/L (ref 3.5–5.1)
Sodium: 138 mmol/L (ref 135–145)
Total Bilirubin: 0.6 mg/dL (ref <1.2)
Total Protein: 6.9 g/dL (ref 6.5–8.1)

## 2023-04-14 LAB — CBC WITH DIFFERENTIAL (CANCER CENTER ONLY)
Abs Immature Granulocytes: 0.01 10*3/uL (ref 0.00–0.07)
Basophils Absolute: 0.1 10*3/uL (ref 0.0–0.1)
Basophils Relative: 1 %
Eosinophils Absolute: 0.2 10*3/uL (ref 0.0–0.5)
Eosinophils Relative: 3 %
HCT: 37.2 % (ref 36.0–46.0)
Hemoglobin: 12.5 g/dL (ref 12.0–15.0)
Immature Granulocytes: 0 %
Lymphocytes Relative: 28 %
Lymphs Abs: 1.5 10*3/uL (ref 0.7–4.0)
MCH: 31.2 pg (ref 26.0–34.0)
MCHC: 33.6 g/dL (ref 30.0–36.0)
MCV: 92.8 fL (ref 80.0–100.0)
Monocytes Absolute: 0.5 10*3/uL (ref 0.1–1.0)
Monocytes Relative: 9 %
Neutro Abs: 3.2 10*3/uL (ref 1.7–7.7)
Neutrophils Relative %: 59 %
Platelet Count: 224 10*3/uL (ref 150–400)
RBC: 4.01 MIL/uL (ref 3.87–5.11)
RDW: 13.2 % (ref 11.5–15.5)
WBC Count: 5.4 10*3/uL (ref 4.0–10.5)
nRBC: 0 % (ref 0.0–0.2)

## 2023-04-15 DIAGNOSIS — R82998 Other abnormal findings in urine: Secondary | ICD-10-CM | POA: Diagnosis not present

## 2023-04-15 DIAGNOSIS — Z1331 Encounter for screening for depression: Secondary | ICD-10-CM | POA: Diagnosis not present

## 2023-04-15 DIAGNOSIS — M810 Age-related osteoporosis without current pathological fracture: Secondary | ICD-10-CM | POA: Diagnosis not present

## 2023-04-15 DIAGNOSIS — Z0000 Encounter for general adult medical examination without abnormal findings: Secondary | ICD-10-CM | POA: Diagnosis not present

## 2023-04-15 DIAGNOSIS — D8989 Other specified disorders involving the immune mechanism, not elsewhere classified: Secondary | ICD-10-CM | POA: Diagnosis not present

## 2023-04-15 DIAGNOSIS — Z1339 Encounter for screening examination for other mental health and behavioral disorders: Secondary | ICD-10-CM | POA: Diagnosis not present

## 2023-04-15 DIAGNOSIS — I2510 Atherosclerotic heart disease of native coronary artery without angina pectoris: Secondary | ICD-10-CM | POA: Diagnosis not present

## 2023-04-15 DIAGNOSIS — I10 Essential (primary) hypertension: Secondary | ICD-10-CM | POA: Diagnosis not present

## 2023-04-15 DIAGNOSIS — I129 Hypertensive chronic kidney disease with stage 1 through stage 4 chronic kidney disease, or unspecified chronic kidney disease: Secondary | ICD-10-CM | POA: Diagnosis not present

## 2023-04-15 DIAGNOSIS — R7301 Impaired fasting glucose: Secondary | ICD-10-CM | POA: Diagnosis not present

## 2023-04-15 DIAGNOSIS — E785 Hyperlipidemia, unspecified: Secondary | ICD-10-CM | POA: Diagnosis not present

## 2023-04-15 DIAGNOSIS — C9000 Multiple myeloma not having achieved remission: Secondary | ICD-10-CM | POA: Diagnosis not present

## 2023-04-15 DIAGNOSIS — I422 Other hypertrophic cardiomyopathy: Secondary | ICD-10-CM | POA: Diagnosis not present

## 2023-04-15 DIAGNOSIS — N1831 Chronic kidney disease, stage 3a: Secondary | ICD-10-CM | POA: Diagnosis not present

## 2023-04-15 DIAGNOSIS — I700 Atherosclerosis of aorta: Secondary | ICD-10-CM | POA: Diagnosis not present

## 2023-04-15 DIAGNOSIS — M7918 Myalgia, other site: Secondary | ICD-10-CM | POA: Diagnosis not present

## 2023-04-15 DIAGNOSIS — Z23 Encounter for immunization: Secondary | ICD-10-CM | POA: Diagnosis not present

## 2023-04-15 LAB — KAPPA/LAMBDA LIGHT CHAINS
Kappa free light chain: 7.9 mg/L (ref 3.3–19.4)
Kappa, lambda light chain ratio: 0.89 (ref 0.26–1.65)
Lambda free light chains: 8.9 mg/L (ref 5.7–26.3)

## 2023-04-22 LAB — MULTIPLE MYELOMA PANEL, SERUM
Albumin SerPl Elph-Mcnc: 4.2 g/dL (ref 2.9–4.4)
Albumin/Glob SerPl: 2.1 — ABNORMAL HIGH (ref 0.7–1.7)
Alpha 1: 0.3 g/dL (ref 0.0–0.4)
Alpha2 Glob SerPl Elph-Mcnc: 0.7 g/dL (ref 0.4–1.0)
B-Globulin SerPl Elph-Mcnc: 0.8 g/dL (ref 0.7–1.3)
Gamma Glob SerPl Elph-Mcnc: 0.3 g/dL — ABNORMAL LOW (ref 0.4–1.8)
Globulin, Total: 2.1 g/dL — ABNORMAL LOW (ref 2.2–3.9)
IgA: 37 mg/dL — ABNORMAL LOW (ref 64–422)
IgG (Immunoglobin G), Serum: 411 mg/dL — ABNORMAL LOW (ref 586–1602)
IgM (Immunoglobulin M), Srm: 62 mg/dL (ref 26–217)
Total Protein ELP: 6.3 g/dL (ref 6.0–8.5)

## 2023-04-25 ENCOUNTER — Inpatient Hospital Stay (HOSPITAL_BASED_OUTPATIENT_CLINIC_OR_DEPARTMENT_OTHER): Payer: Medicare Other | Admitting: Hematology

## 2023-04-25 ENCOUNTER — Ambulatory Visit: Payer: Medicare Other | Admitting: Hematology

## 2023-04-25 DIAGNOSIS — H04123 Dry eye syndrome of bilateral lacrimal glands: Secondary | ICD-10-CM | POA: Diagnosis not present

## 2023-04-25 DIAGNOSIS — M8580 Other specified disorders of bone density and structure, unspecified site: Secondary | ICD-10-CM | POA: Diagnosis not present

## 2023-04-25 DIAGNOSIS — I422 Other hypertrophic cardiomyopathy: Secondary | ICD-10-CM | POA: Diagnosis not present

## 2023-04-25 DIAGNOSIS — M25559 Pain in unspecified hip: Secondary | ICD-10-CM | POA: Diagnosis not present

## 2023-04-25 DIAGNOSIS — E785 Hyperlipidemia, unspecified: Secondary | ICD-10-CM | POA: Diagnosis not present

## 2023-04-25 DIAGNOSIS — H524 Presbyopia: Secondary | ICD-10-CM | POA: Diagnosis not present

## 2023-04-25 DIAGNOSIS — D8989 Other specified disorders involving the immune mechanism, not elsewhere classified: Secondary | ICD-10-CM | POA: Diagnosis not present

## 2023-04-25 DIAGNOSIS — H2513 Age-related nuclear cataract, bilateral: Secondary | ICD-10-CM | POA: Diagnosis not present

## 2023-04-25 DIAGNOSIS — C9000 Multiple myeloma not having achieved remission: Secondary | ICD-10-CM | POA: Diagnosis not present

## 2023-04-25 DIAGNOSIS — G8929 Other chronic pain: Secondary | ICD-10-CM | POA: Diagnosis not present

## 2023-04-25 NOTE — Progress Notes (Signed)
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  HISTORY OF PRESENTING ILLNESS:  Please see previous note for details on initial presentation  INTERVAL HISTORY  Traci [name] is a 72 y[place]. 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   I connected with Traci [name] on 04/25/23 at  8:40 AM EST by telephone visit and verified that I am speaking with the correct person using two identifiers.   I last connected with the patient on 10/21/2022 and she was doing well overall.   Patient notes she has been doing well overall since our last visit. She complains of chronic joint pain throughout her body. However, she notes that her hip pain has worsen since our last visit. She has been to a rheumatologist, who has diagnosed her with arthritis.   She denies any new infection issues, fever, chills, night sweats, unexpected weight loss, back pain, abdominal pain, or leg swelling.   She has discontinued Gabapentin and Cymbalta.   Patient regularly takes her vitamin-D and B-Complex supplements. She regularly receives her Prolia injection with her Rheumatologist.    Patient is UTD with influenza vaccine.  I discussed the limitations, risks, security and privacy concerns of performing an evaluation and management service by telemedicine and the availability of in-person appointments. I also discussed with the patient that there may be a patient responsible charge related to this service. The patient expressed understanding and agreed to proceed.   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   MEDICAL HISTORY:  Past Medical History:  Diagnosis Date   ASHD (arteriosclerotic heart disease)    Mild septal hypertrophy, 2D ECHO 11/2006, Dr. Antoine Poche   Breast cancer [facility])    PMH of, Dr. Francina Ames, Dr. Donnie Coffin  multilpe sites overlaping, ER +   Facet syndrome, lumbar    Dr. Charlett Blake   History of COVID-19 05/07/2019   Hyperlipidemia    Hypertension    Osteopenia    Skin cancer, basal cell    Dr. Amy Swaziland     SURGICAL HISTORY: Past Surgical History:  Procedure Laterality Date   CATARACT EXTRACTION Right 06/2020   CHOLECYSTECTOMY  about 1993   COLONOSCOPY      X 3 negative (last 2008, due 2015), Dr. Juanda Chance   MASTECTOMY Left 11/2004   Dr. Francina Ames, also reduction of right breast and reconstruction on the left.   ROTATOR CUFF REPAIR Left    TONSILLECTOMY     TRANSESOPHAGEAL ECHOCARDIOGRAM  2012   Overall left ventricular systolic function was normal. Left ventricular ejection fraction was estimated, range being 55% to 60%. There were no left ventricular regional wall motional abnormalities. Left ventricular wall thickness was mildly increased. There was mild focal basal septal hypertrophy.   WISDOM TOOTH EXTRACTION      SOCIAL HISTORY: Social History   Socioeconomic History   Marital status: Married    Spouse name: Not on file   Number of children: Not on file  Years of education: Not on file   Highest education level: Not on file  Occupational History   Occupation: Retired    Associate Professor: UNEMPLOYED  Tobacco Use   Smoking status: Former    Current packs/day: 0[name]00    Average packs/day: 1 pack/day for 10[name]0 years (10[name]0 ttl pk-yrs)    Types: Cigarettes    Start date: 05/06/1968    Quit date: 05/06/1978    Years since quitting: 45[name]0   Smokeless tobacco: Never    Tobacco comments:    smoked 1970- 1980, up to 1 ppd  Vaping Use   Vaping status: Never Used  Substance and Sexual Activity   Alcohol use: Yes    Alcohol/week: 4[name]0 standard drinks of alcohol    Types: 4 Glasses of wine per week   Drug use: No   Sexual activity: Yes    Partners: Male    Birth control/protection: Post-menopausal  Other Topics Concern   Not on file  Social History Narrative   Married   Regular exercise: Yes, high level CVE, walks 2 mpd 5x/ week & gym   No specific diet   Social Drivers of Health   Financial Resource Strain: Low Risk  (04/25/2021)   Received from [facility], [facility]   Overall Financial Resource Strain (CARDIA)    Difficulty of Paying Living Expenses: Not hard at all  Food Insecurity: No Food Insecurity (04/25/2021)   Received from [facility], [facility]   Hunger Vital Sign    Worried About Running Out of Food in the Last Year: Never true    Ran Out of Food in the Last Year: Never true  Transportation Needs: No Transportation Needs (04/25/2021)   Received from [facility], [facility]   [facility] - Transportation    Lack of Transportation (Medical): No    Lack of Transportation (Non-Medical): No  Physical Activity: Not on file  Stress: No Stress Concern Present (04/25/2021)   Received from [facility], [facility] - Occupational Stress Questionnaire    Feeling of Stress : Not at all  Social Connections: Not on file  Intimate Partner Violence: Not on file    FAMILY HISTORY: Family History  Problem Relation Age of Onset   Hypertension Mother    Coronary artery disease Mother        no MI   Lung cancer Mother        smoker   Colon cancer Father 2   Coronary artery disease Father        no MI   Prostate cancer Father    Diabetes Father    Hypertension Sister    Cancer Maternal Grandfather        GI   Diabetes Paternal Grandfather    Stroke Neg Hx     Esophageal cancer Neg Hx    Stomach cancer Neg Hx    Rectal cancer Neg Hx     ALLERGIES:  is allergic to shellfish allergy, codeine, hydrocodone, and vicodin [hydrocodone-acetaminophen].  MEDICATIONS:  Current Outpatient Medications  Medication Sig Dispense Refill   acyclovir (ZOVIRAX) 400 MG tablet TAKE 1 TABLET BY MOUTH TWICE A DAY 180 tablet 1   amLODipine (NORVASC) 10 MG tablet Take 1 tablet (10 mg total) by mouth daily. 30 tablet 0   B Complex Vitamins (VITAMIN B COMPLEX) CAPS TAKE 1 CAPSULE BY MOUTH EVERY DAY 100 capsule 1   benazepril (LOTENSIN) 40 MG tablet  Take 1 tablet (40 mg total) by mouth daily. 30 tablet 0   cholecalciferol (VITAMIN D3) 25 MCG (1000 UNIT) tablet Take 2 tablets (2,000 Units total) by mouth daily. 60 tablet 11   denosumab (PROLIA) 60 MG/ML SOLN injection Inject 60 mg into the skin every 6 (six) months. Administer in upper arm, thigh, or abdomen     DULoxetine (CYMBALTA) 30 MG capsule TAKE 1 CAPSULE BY MOUTH EVERY DAY (Patient not taking: Reported on 05/15/2022) 90 capsule 2   gabapentin (NEURONTIN) 100 MG capsule Take 1 capsule (100 mg total) by mouth 2 (two) times daily. 60 capsule 0   iron polysaccharides (NIFEREX) 150 MG capsule Take 1 capsule (150 mg total) by mouth daily. 30 capsule 2   magnesium oxide (MAG-OX) 400 MG tablet Take by mouth daily.     rosuvastatin (CRESTOR) 10 MG tablet Take 10 mg by mouth daily.     No current facility-administered medications for this visit.    REVIEW OF SYSTEMS:   10 Point review of Systems was done is negative except as noted above.  PHYSICAL EXAMINATION :  Telemedicine visit  LABORATORY DATA:  I have reviewed the data as listed  .    Latest Ref Rng & Units 04/14/2023    9:08 AM 01/08/2023    3:58 PM 10/11/2022   10:29 AM  CBC  WBC 4[name]0 - 10[name]5 K/uL 5[name]4  7[name]6  6[name]8   Hemoglobin 12[name]0 - 15[name]0 g/dL 16[name]1  09[name]6  04[name]5   Hematocrit 36[name]0 - 46[name]0 % 37[name]2  36[name]3  35[name]3   Platelets 150 - 400 K/uL 224  231  251   .CBC     Component Value Date/Time   WBC 5[name]4 04/14/2023 0908   WBC 5[name]2 02/25/2022 1240   RBC 4[name]01 04/14/2023 0908   HGB 12[name]5 04/14/2023 0908   HCT 37[name]2 04/14/2023 0908   PLT 224 04/14/2023 0908   MCV 92[name]8 04/14/2023 0908   MCH 31[name]2 04/14/2023 0908   MCHC 33[name]6 04/14/2023 0908   RDW 13[name]2 04/14/2023 0908   LYMPHSABS 1[name]5 04/14/2023 0908   MONOABS 0[name]5 04/14/2023 0908   EOSABS 0[name]2 04/14/2023 0908   BASOSABS 0[name]1 04/14/2023 0908       Latest Ref Rng & Units 04/14/2023    9:08 AM 01/08/2023    3:58 PM 10/11/2022   10:29 AM  CMP  Glucose 70 - 99 mg/dL 99  95  409   BUN 8 - 23 mg/dL 19  24  26    Creatinine 0[name]44 - 1[name]00 mg/dL 8[name]11  9[name]14  7[name]82   Sodium 135 - 145 mmol/L 138  140  139   Potassium 3[name]5 - 5[name]1 mmol/L 3[name]7  3[name]7  3[name]8   Chloride 98 - 111 mmol/L 104  104  104   CO2 22 - 32 mmol/L 26  29  26    Calcium 8[name]9 - 10[name]3 mg/dL 9[name]3  9[name]5  9[name]1   Total Protein 6[name]5 - 8[name]1 g/dL 6[name]9  6[name]5  6[name]7   Total Bilirubin <1[name]2 mg/dL 0[name]6  0[name]5  0[name]6   Alkaline Phos 38 - 126 U/L 36  35  36   AST 15 - 41 U/L 25  27  24    ALT 0 - 44 U/L 15  21  16           Surgical Pathology  CASE: [id number]  PATIENT: Traci [name]  Bone Marrow Report    Clinical History: Multiple myeloma , Bence-jones proteinuria  (BH)   DIAGNOSIS:   BONE MARROW,  ASPIRATE, CLOT, CORE:  -Normocellular bone marrow with plasma cell neoplasm  -See comment   PERIPHERAL BLOOD:  -Normocytic-normochromic anemia   COMMENT:   The bone marrow is generally normocellular for age with trilineage  hematopoiesis.  In this background, the plasma cells are increased in  number representing 12% of cells associated with small clusters in the  clot and biopsy sections.  The plasma cells display lambda light chain  restriction consistent with plasma cell neoplasm.  Correlation with  cytogenetic and FISH studies is recommended.     RADIOGRAPHIC STUDIES: I have personally reviewed the radiological images as listed and agreed with the findings in the  report. No results found.  ASSESSMENT & PLAN:   72 year old female with history of hypertrophic cardiomyopathy, hypertension, dyslipidemia with  #1  Smoldering myeloma with renal LCDD Bone marrow biopsy with 12% lambda restricted plasma cells PET CT scan with no hypermetabolic bone lesions Cytogenetics normal female karyotype Molecular cytogenetics atypical t (11;14) Component     Latest Ref Rng & Units 01/30/2021  IgG (Immunoglobin G), Serum     586 - 1,602 mg/dL 213  IgA     64 - 086 mg/dL 91  IgM (Immunoglobulin M), Srm     26 - 217 mg/dL 79  Total Protein ELP     6[name]0 - 8[name]5 g/dL 6[name]8  Albumin SerPl Elph-Mcnc     2[name]9 - 4[name]4 g/dL 4[name]3  Alpha 1     0[name]0 - 0[name]4 g/dL 0[name]2  Alpha2 Glob SerPl Elph-Mcnc     0[name]4 - 1[name]0 g/dL 0[name]7  B-Globulin SerPl Elph-Mcnc     0[name]7 - 1[name]3 g/dL 1[name]0  Gamma Glob SerPl Elph-Mcnc     0[name]4 - 1[name]8 g/dL 0[name]6  M Protein SerPl Elph-Mcnc     Not Observed g/dL Not Observed  Globulin, Total     2[name]2 - 3[name]9 g/dL 2[name]5  Albumin/Glob SerPl     0[name]7 - 1[name]7 1[name]8 (H)  IFE 1      Comment  Please Note (HCV):      Comment  Kappa free light chain     3[name]3 - 19[name]4 mg/L 11[name]8  Lambda free light chains     5[name]7 - 26[name]3 mg/L 480[name]8 (H)  Kappa, lambda light chain ratio     0[name]26 - 1[name]65 0[name]02 (L)   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  History of hypertension, dyslipidemia #5  Normocytic anemia with some worsening with hemoglobin down to 8[name]9 likely from Cytoxan.  Previous mild anemia likely related to chronic kidney disease and plasma cell dyscrasia.  Plan  -Discussed lab results from 04/14/2023 in detail with the patient. CBC is stable. CMP shows slightly elevated creatinine of 1[name]08.  -Discussed Kappa/Lambda light chain results from 04/14/2023, which  did not show any abnormalities.  -Discussed Multiple myeloma panel result from 04/14/2023. Did not show M-Protein.  -No indications of additional treatment of plasma cell dyscrasia at this time -No prohibitive toxicities from Daratumumab at this time. -Patient recommended to drink at least 64 oz of water daily -Follow-up with labs in 6 months  Follow-up Labs in 22 weeks RTC with Dr Candise Che in 24 weeks  The total time spent in the appointment was 20 minutes* .  All of the patient's questions were answered with apparent satisfaction. The patient  knows to call the clinic with any problems, questions or concerns.   Wyvonnia Lora MD MS AAHIVMS [facility] Hematology/Oncology Physician [facility]  .*Total Encounter Time as defined by the Centers for Medicare and Medicaid Services includes, in addition to the face-to-face time of a patient visit (documented in the note above) non-face-to-face time: obtaining and reviewing outside history, ordering and reviewing medications, tests or procedures, care coordination (communications with other health care professionals or caregivers) and documentation in the medical record.   I,Param Shah,acting as a Neurosurgeon for Wyvonnia Lora, MD.,have documented all relevant documentation on the behalf of Wyvonnia Lora, MD,as directed by  Wyvonnia Lora, MD while in the presence of Wyvonnia Lora, MD.  .I have reviewed the above documentation for accuracy and completeness, and I agree with the above. Johney Maine MD

## 2023-04-28 ENCOUNTER — Telehealth: Payer: Self-pay | Admitting: Hematology

## 2023-04-28 NOTE — Telephone Encounter (Signed)
 Left patient a vm regarding upcoming appointment

## 2023-04-30 ENCOUNTER — Encounter: Payer: Self-pay | Admitting: Hematology

## 2023-04-30 ENCOUNTER — Other Ambulatory Visit: Payer: Self-pay

## 2023-05-01 DIAGNOSIS — L905 Scar conditions and fibrosis of skin: Secondary | ICD-10-CM | POA: Diagnosis not present

## 2023-05-01 DIAGNOSIS — D485 Neoplasm of uncertain behavior of skin: Secondary | ICD-10-CM | POA: Diagnosis not present

## 2023-05-01 DIAGNOSIS — C44612 Basal cell carcinoma of skin of right upper limb, including shoulder: Secondary | ICD-10-CM | POA: Diagnosis not present

## 2023-05-01 DIAGNOSIS — C44519 Basal cell carcinoma of skin of other part of trunk: Secondary | ICD-10-CM | POA: Diagnosis not present

## 2023-05-01 DIAGNOSIS — Z85828 Personal history of other malignant neoplasm of skin: Secondary | ICD-10-CM | POA: Diagnosis not present

## 2023-05-20 DIAGNOSIS — M810 Age-related osteoporosis without current pathological fracture: Secondary | ICD-10-CM | POA: Diagnosis not present

## 2023-05-21 DIAGNOSIS — M25551 Pain in right hip: Secondary | ICD-10-CM | POA: Diagnosis not present

## 2023-05-21 DIAGNOSIS — M7918 Myalgia, other site: Secondary | ICD-10-CM | POA: Diagnosis not present

## 2023-05-27 ENCOUNTER — Other Ambulatory Visit: Payer: Self-pay

## 2023-05-27 DIAGNOSIS — C9000 Multiple myeloma not having achieved remission: Secondary | ICD-10-CM

## 2023-07-09 ENCOUNTER — Inpatient Hospital Stay: Payer: Medicare Other | Attending: Hematology

## 2023-07-09 DIAGNOSIS — N1831 Chronic kidney disease, stage 3a: Secondary | ICD-10-CM | POA: Diagnosis not present

## 2023-07-09 DIAGNOSIS — C9000 Multiple myeloma not having achieved remission: Secondary | ICD-10-CM | POA: Insufficient documentation

## 2023-07-09 DIAGNOSIS — I129 Hypertensive chronic kidney disease with stage 1 through stage 4 chronic kidney disease, or unspecified chronic kidney disease: Secondary | ICD-10-CM | POA: Diagnosis not present

## 2023-07-09 DIAGNOSIS — Z79899 Other long term (current) drug therapy: Secondary | ICD-10-CM | POA: Diagnosis not present

## 2023-07-09 LAB — CMP (CANCER CENTER ONLY)
ALT: 21 U/L (ref 0–44)
AST: 32 U/L (ref 15–41)
Albumin: 4.8 g/dL (ref 3.5–5.0)
Alkaline Phosphatase: 42 U/L (ref 38–126)
Anion gap: 9 (ref 5–15)
BUN: 23 mg/dL (ref 8–23)
CO2: 28 mmol/L (ref 22–32)
Calcium: 9.9 mg/dL (ref 8.9–10.3)
Chloride: 103 mmol/L (ref 98–111)
Creatinine: 0.99 mg/dL (ref 0.44–1.00)
GFR, Estimated: 60 mL/min — ABNORMAL LOW (ref >60)
Glucose, Bld: 99 mg/dL (ref 70–99)
Potassium: 3.8 mmol/L (ref 3.5–5.1)
Sodium: 140 mmol/L (ref 135–145)
Total Bilirubin: 0.6 mg/dL (ref 0.0–1.2)
Total Protein: 7.3 g/dL (ref 6.5–8.1)

## 2023-07-09 LAB — CBC WITH DIFFERENTIAL (CANCER CENTER ONLY)
Abs Immature Granulocytes: 0.01 10*3/uL (ref 0.00–0.07)
Basophils Absolute: 0.1 10*3/uL (ref 0.0–0.1)
Basophils Relative: 1 %
Eosinophils Absolute: 0.3 10*3/uL (ref 0.0–0.5)
Eosinophils Relative: 4 %
HCT: 38.4 % (ref 36.0–46.0)
Hemoglobin: 12.8 g/dL (ref 12.0–15.0)
Immature Granulocytes: 0 %
Lymphocytes Relative: 22 %
Lymphs Abs: 1.3 10*3/uL (ref 0.7–4.0)
MCH: 30.7 pg (ref 26.0–34.0)
MCHC: 33.3 g/dL (ref 30.0–36.0)
MCV: 92.1 fL (ref 80.0–100.0)
Monocytes Absolute: 0.5 10*3/uL (ref 0.1–1.0)
Monocytes Relative: 9 %
Neutro Abs: 4 10*3/uL (ref 1.7–7.7)
Neutrophils Relative %: 64 %
Platelet Count: 206 10*3/uL (ref 150–400)
RBC: 4.17 MIL/uL (ref 3.87–5.11)
RDW: 13.7 % (ref 11.5–15.5)
WBC Count: 6.1 10*3/uL (ref 4.0–10.5)
nRBC: 0 % (ref 0.0–0.2)

## 2023-07-10 DIAGNOSIS — C44519 Basal cell carcinoma of skin of other part of trunk: Secondary | ICD-10-CM | POA: Diagnosis not present

## 2023-07-10 DIAGNOSIS — C44612 Basal cell carcinoma of skin of right upper limb, including shoulder: Secondary | ICD-10-CM | POA: Diagnosis not present

## 2023-07-10 DIAGNOSIS — Z85828 Personal history of other malignant neoplasm of skin: Secondary | ICD-10-CM | POA: Diagnosis not present

## 2023-07-10 LAB — KAPPA/LAMBDA LIGHT CHAINS
Kappa free light chain: 9 mg/L (ref 3.3–19.4)
Kappa, lambda light chain ratio: 1.03 (ref 0.26–1.65)
Lambda free light chains: 8.7 mg/L (ref 5.7–26.3)

## 2023-07-11 LAB — MULTIPLE MYELOMA PANEL, SERUM
Albumin SerPl Elph-Mcnc: 4.3 g/dL (ref 2.9–4.4)
Albumin/Glob SerPl: 1.8 — ABNORMAL HIGH (ref 0.7–1.7)
Alpha 1: 0.3 g/dL (ref 0.0–0.4)
Alpha2 Glob SerPl Elph-Mcnc: 0.8 g/dL (ref 0.4–1.0)
B-Globulin SerPl Elph-Mcnc: 1 g/dL (ref 0.7–1.3)
Gamma Glob SerPl Elph-Mcnc: 0.5 g/dL (ref 0.4–1.8)
Globulin, Total: 2.5 g/dL (ref 2.2–3.9)
IgA: 56 mg/dL — ABNORMAL LOW (ref 64–422)
IgG (Immunoglobin G), Serum: 592 mg/dL (ref 586–1602)
IgM (Immunoglobulin M), Srm: 98 mg/dL (ref 26–217)
Total Protein ELP: 6.8 g/dL (ref 6.0–8.5)

## 2023-07-16 ENCOUNTER — Ambulatory Visit (HOSPITAL_COMMUNITY): Payer: Medicare Other | Attending: Cardiology

## 2023-07-16 DIAGNOSIS — I10 Essential (primary) hypertension: Secondary | ICD-10-CM | POA: Insufficient documentation

## 2023-07-16 DIAGNOSIS — I517 Cardiomegaly: Secondary | ICD-10-CM | POA: Insufficient documentation

## 2023-07-16 LAB — ECHOCARDIOGRAM COMPLETE
AR max vel: 2.37 cm2
AV Area VTI: 2.32 cm2
AV Area mean vel: 2.34 cm2
AV Mean grad: 5.5 mmHg
AV Peak grad: 10.1 mmHg
Ao pk vel: 1.59 m/s
Area-P 1/2: 2.99 cm2
MV M vel: 4.5 m/s
MV Peak grad: 81 mmHg
S' Lateral: 2.3 cm

## 2023-07-24 DIAGNOSIS — H2512 Age-related nuclear cataract, left eye: Secondary | ICD-10-CM | POA: Diagnosis not present

## 2023-07-28 DIAGNOSIS — M542 Cervicalgia: Secondary | ICD-10-CM | POA: Diagnosis not present

## 2023-07-28 DIAGNOSIS — M25512 Pain in left shoulder: Secondary | ICD-10-CM | POA: Diagnosis not present

## 2023-07-28 DIAGNOSIS — M19011 Primary osteoarthritis, right shoulder: Secondary | ICD-10-CM | POA: Diagnosis not present

## 2023-08-09 DIAGNOSIS — M542 Cervicalgia: Secondary | ICD-10-CM | POA: Diagnosis not present

## 2023-08-25 DIAGNOSIS — M542 Cervicalgia: Secondary | ICD-10-CM | POA: Diagnosis not present

## 2023-08-26 DIAGNOSIS — Z85828 Personal history of other malignant neoplasm of skin: Secondary | ICD-10-CM | POA: Diagnosis not present

## 2023-08-26 DIAGNOSIS — L814 Other melanin hyperpigmentation: Secondary | ICD-10-CM | POA: Diagnosis not present

## 2023-09-03 DIAGNOSIS — M542 Cervicalgia: Secondary | ICD-10-CM | POA: Diagnosis not present

## 2023-09-07 ENCOUNTER — Other Ambulatory Visit: Payer: Self-pay

## 2023-09-07 ENCOUNTER — Ambulatory Visit
Admission: RE | Admit: 2023-09-07 | Discharge: 2023-09-07 | Disposition: A | Discharge status: Home / Self Care | Payer: Self-pay | Source: Ambulatory Visit | Attending: Family Medicine | Admitting: Family Medicine

## 2023-09-07 ENCOUNTER — Ambulatory Visit (INDEPENDENT_AMBULATORY_CARE_PROVIDER_SITE_OTHER)

## 2023-09-07 VITALS — BP 118/78 | HR 83 | Temp 98.5°F | Resp 16

## 2023-09-07 DIAGNOSIS — R509 Fever, unspecified: Secondary | ICD-10-CM | POA: Diagnosis not present

## 2023-09-07 DIAGNOSIS — R051 Acute cough: Secondary | ICD-10-CM

## 2023-09-07 DIAGNOSIS — R059 Cough, unspecified: Secondary | ICD-10-CM | POA: Diagnosis not present

## 2023-09-07 DIAGNOSIS — J984 Other disorders of lung: Secondary | ICD-10-CM | POA: Diagnosis not present

## 2023-09-07 DIAGNOSIS — R918 Other nonspecific abnormal finding of lung field: Secondary | ICD-10-CM | POA: Diagnosis not present

## 2023-09-07 DIAGNOSIS — J189 Pneumonia, unspecified organism: Secondary | ICD-10-CM | POA: Diagnosis not present

## 2023-09-07 LAB — POC RSV: RSV Antigen, POC: NEGATIVE

## 2023-09-07 LAB — POC COVID19/FLU A&B COMBO
Covid Antigen, POC: NEGATIVE
Influenza A Antigen, POC: NEGATIVE
Influenza B Antigen, POC: NEGATIVE

## 2023-09-07 MED ORDER — AZITHROMYCIN 250 MG PO TABS: 250.0000 mg | ORAL_TABLET | Freq: Every day | ORAL | 0 refills | Status: AC

## 2023-09-07 MED ORDER — AMOXICILLIN-POT CLAVULANATE 875-125 MG PO TABS: 1.0000 | ORAL_TABLET | Freq: Two times a day (BID) | ORAL | 0 refills | Status: AC

## 2023-09-07 MED ORDER — BENZONATATE 100 MG PO CAPS
100.0000 mg | ORAL_CAPSULE | Freq: Three times a day (TID) | ORAL | 0 refills | Status: AC
Start: 2023-09-07 — End: ?

## 2023-09-07 MED ORDER — ALBUTEROL SULFATE HFA 108 (90 BASE) MCG/ACT IN AERS
1.0000 | INHALATION_SPRAY | Freq: Four times a day (QID) | RESPIRATORY_TRACT | 0 refills | Status: AC | PRN
Start: 2023-09-07 — End: ?

## 2023-09-07 NOTE — Discharge Instructions (Addendum)
 Your chest x-ray shows pneumonia.  Please start Augmentin twice daily for 7 days and Zithromycin daily for the next 5 days.  Albuterol  inhaler as needed for any wheezing or shortness of breath.  You may take Tessalon 3 times a day as needed for your cough.  Lots of rest and fluids.  Please follow-up with your PCP in 2 to 3 days for recheck.  Please go to the ER ASAP if you develop any worsening symptoms.  Also follow-up with your PCP in 4 weeks for repeat chest x-ray to confirm resolution of your pneumonia.  I hope you feel better soon!

## 2023-09-07 NOTE — ED Provider Notes (Addendum)
 [facility]    CSN: [id number] Arrival date & time: 09/07/23  0900      History   Chief Complaint Chief Complaint  Patient presents with   Cough    Respiratory ailment - Entered by patient    HPI Traci [name] is a 73 y.o. female  presents for evaluation of URI symptoms for 4 days. Patient reports associated symptoms of productive cough, fatigue, shortness of breath, fever that developed last night of just under 102 degrees. Denies N/V/D, sore throat, ear pain, body aches. Patient does not have a hx of asthma. Patient is not an active smoker.   Reports has been has similar symptoms but not as bad.  Pt has taken 1 dose of cough medicine OTC for symptoms. Pt has no other concerns at this time.    Cough Associated symptoms: fever and shortness of breath     Past Medical History:  Diagnosis Date   ASHD (arteriosclerotic heart disease)    Mild septal hypertrophy, 2D ECHO 11/2006, Dr. Lavonne Prairie   Breast cancer [facility])    PMH of, Dr. Aldo Amble, Dr. Kendra Pavy  multilpe sites overlaping, ER +   Facet syndrome, lumbar    Dr. [place]   History of COVID-19 05/07/2019   Hyperlipidemia    Hypertension    Osteopenia    Skin cancer, basal cell    Dr. 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   Multiple myeloma not having achieved remission (HCC) 04/20/2021   Counseling regarding advance care planning and goals of care 04/20/2021   Other spondylosis with radiculopathy, cervical region 07/14/2018   Right hip pain 08/19/2016   Sciatica of left side 08/19/2016   Basal cell carcinoma of skin 12/16/2012   Family history of colon cancer 12/16/2012   ANEMIA, MILD 04/16/2010   HYPERTROPHIC CARDIOMYOPATHY 01/22/2010   Palpitations 01/22/2010   MURMUR 01/10/2009   SPONDYLOSIS, LUMBAR 01/04/2009   OSTEOPENIA 01/04/2009    FASTING HYPERGLYCEMIA 01/04/2009   HYPERLIPIDEMIA 02/04/2008   BREAST CANCER, HX OF 02/04/2008   Essential hypertension 09/18/2006    Past Surgical History:  Procedure Laterality Date   CATARACT EXTRACTION Right 06/2020   CHOLECYSTECTOMY  about 1993   COLONOSCOPY      X 3 negative (last 2008, due 2015), Dr. Grandville Lax   MASTECTOMY Left 11/2004   Dr. Aldo Amble, also reduction of right breast and reconstruction on the left.   ROTATOR CUFF REPAIR Left    TONSILLECTOMY     TRANSESOPHAGEAL ECHOCARDIOGRAM  2012   Overall left ventricular systolic function was normal. Left ventricular ejection fraction was estimated, range being 55% to 60%. There were no left ventricular regional wall motional abnormalities. Left ventricular wall thickness was mildly increased. There was mild focal basal septal hypertrophy.   WISDOM TOOTH EXTRACTION      OB History     Gravida  2   Para  2   Term  2   Preterm  0   AB  0   Living  2      SAB  0   IAB  0   Ectopic  0   Multiple  0   Live Births  2            Home Medications    Prior to Admission medications   Medication Sig Start  Date End Date Taking? Authorizing Provider  albuterol  (VENTOLIN  HFA) 108 (90 Base) MCG/ACT inhaler Inhale 1-2 puffs into the lungs every 6 (six) hours as needed for wheezing or shortness of breath. 09/07/23  Yes Henson Fraticelli, Jodi R, NP  amoxicillin-clavulanate (AUGMENTIN) 875-125 MG tablet Take 1 tablet by mouth every 12 (twelve) hours. 09/07/23  Yes Carolyn Maniscalco, Jodi R, NP  azithromycin (ZITHROMAX) 250 MG tablet Take 1 tablet (250 mg total) by mouth daily. Take first 2 tablets together, then 1 every day until finished. 09/07/23  Yes Javin Nong, Jodi R, NP  benzonatate (TESSALON) 100 MG capsule Take 1 capsule (100 mg total) by mouth every 8 (eight) hours. 09/07/23  Yes Aryelle Figg, Jodi R, NP  amLODipine  (NORVASC ) 10 MG tablet Take 1 tablet (10 mg total) by mouth daily. 10/31/14   Alinda Apley, MD  B Complex Vitamins (VITAMIN B  COMPLEX) CAPS TAKE 1 CAPSULE BY MOUTH EVERY DAY 05/20/22   Frankie Israel, MD  benazepril  (LOTENSIN ) 40 MG tablet Take 1 tablet (40 mg total) by mouth daily. 10/31/14   Alinda Apley, MD  cholecalciferol (VITAMIN D3) 25 MCG (1000 UNIT) tablet Take 2 tablets (2,000 Units total) by mouth daily. 02/11/22   Kale, Gautam Kishore, MD  denosumab  (PROLIA ) 60 MG/ML SOLN injection Inject 60 mg into the skin every 6 (six) months. Administer in upper arm, thigh, or abdomen    [provider]  iron  polysaccharides (NIFEREX) 150 MG capsule Take 1 capsule (150 mg total) by mouth daily. 02/11/22   Kale, Gautam Kishore, MD  magnesium oxide (MAG-OX) 400 MG tablet Take by mouth daily.    [provider]  rosuvastatin (CRESTOR) 10 MG tablet Take 10 mg by mouth daily. 04/12/22   [provider]    Family History Family History  Problem Relation Age of Onset   Hypertension Mother    Coronary artery disease Mother        no MI   Lung cancer Mother        smoker   Colon cancer Father 28   Coronary artery disease Father        no MI   Prostate cancer Father    Diabetes Father    Hypertension Sister    Cancer Maternal Grandfather        GI   Diabetes Paternal Grandfather    Stroke Neg Hx    Esophageal cancer Neg Hx    Stomach cancer Neg Hx    Rectal cancer Neg Hx     Social History Social History   Tobacco Use   Smoking status: Former    Current packs/day: 0.00    Average packs/day: 1 pack/day for 10.0 years (10.0 ttl pk-yrs)    Types: Cigarettes    Start date: 05/06/1968    Quit date: 05/06/1978    Years since quitting: 45.3   Smokeless tobacco: Never   Tobacco comments:    smoked 1970- 1980, up to 1 ppd  Vaping Use   Vaping status: Never Used  Substance Use Topics   Alcohol  use: Yes    Alcohol /week: 4.0 standard drinks of alcohol     Types: 4 Glasses of wine per week   Drug use: No     Allergies   Shellfish allergy, Codeine, Hydrocodone, and Vicodin  [hydrocodone-acetaminophen ]   Review of Systems Review of Systems  Constitutional:  Positive for fatigue and fever.  HENT:  Positive for congestion.   Respiratory:  Positive for cough and shortness of breath.  Physical Exam Triage Vital Signs ED Triage Vitals  Encounter Vitals Group     BP 09/07/23 0908 118/78     Systolic BP Percentile --      Diastolic BP Percentile --      Pulse Rate 09/07/23 0908 83     Resp 09/07/23 0908 16     Temp 09/07/23 0908 98.5 F (36.9 C)     Temp Source 09/07/23 0908 Oral     SpO2 09/07/23 0908 91 %     Weight --      Height --      Head Circumference --      Peak Flow --      Pain Score 09/07/23 0905 0     Pain Loc --      Pain Education --      Exclude from Growth Chart --    No data found.  Updated Vital Signs BP 118/78   Pulse 83   Temp 98.5 F (36.9 C) (Oral)   Resp 16   LMP 05/07/1999 (LMP Unknown)   SpO2 91%   Visual Acuity Right Eye Distance:   Left Eye Distance:   Bilateral Distance:    Right Eye Near:   Left Eye Near:    Bilateral Near:     Physical Exam Vitals and nursing note reviewed.  Constitutional:      General: She is not in acute distress.    Appearance: She is well-developed. She is not ill-appearing.  HENT:     Head: Normocephalic and atraumatic.     Right Ear: Tympanic membrane and ear canal normal.     Left Ear: Tympanic membrane and ear canal normal.     Nose: Congestion present.     Mouth/Throat:     Mouth: Mucous membranes are moist.     Pharynx: Oropharynx is clear. Uvula midline. No oropharyngeal exudate or posterior oropharyngeal erythema.     Tonsils: No tonsillar exudate or tonsillar abscesses.  Eyes:     Conjunctiva/sclera: Conjunctivae normal.     Pupils: Pupils are equal, round, and reactive to light.  Cardiovascular:     Rate and Rhythm: Normal rate and regular rhythm.     Heart sounds: Normal heart sounds.  Pulmonary:     Effort: Pulmonary effort is normal.     Breath  sounds: Normal breath sounds. No wheezing, rhonchi or rales.  Musculoskeletal:     Cervical back: Normal range of motion and neck supple.  Lymphadenopathy:     Cervical: No cervical adenopathy.  Skin:    General: Skin is warm and dry.  Neurological:     General: No focal deficit present.     Mental Status: She is alert and oriented to person, place, and time.  Psychiatric:        Mood and Affect: Mood normal.        Behavior: Behavior normal.      UC Treatments / Results  Labs (all labs ordered are listed, but only abnormal results are displayed) Labs Reviewed  POC COVID19/FLU A&B COMBO  POC RSV   CMP (Cancer Center only) Order: [id number]  Status: Final result     Next appt: 09/30/2023 at 09:15 AM in Oncology ([facility])     Dx: Multiple myeloma not having achieved ...   Test Result Released: Yes (seen)   0 Result Notes          Component Ref Range & Units (hover) 2 mo ago (07/09/23) 4 mo ago (04/14/23) 8  mo ago (01/08/23) 11 mo ago (10/11/22) 1 yr ago (04/09/22) 1 yr ago (02/25/22) 1 yr ago (02/11/22)  Sodium 140 138 140 139 138 139 139  Potassium 3.8 3.7 3.7 3.8 3.6 3.1 Low  3.1 Low   Chloride 103 104 104 104 103 102 104  CO2 28 26 29 26 25 29 29   Glucose, Bld 99 99 CM 95 CM 102 High  CM 95 CM 117 High  CM 105 High  CM  Comment: Glucose reference range applies only to samples taken after fasting for at least 8 hours.  BUN 23 19 24  High  26 High  29 High  29 High  25 High   Creatinine 0.99 1.08 High  1.13 High  1.15 High  1.18 High  1.19 High  1.28 High   Calcium 9.9 9.3 9.5 9.1 10.3 9.6 9.5  Total Protein 7.3 6.9 6.5 6.7 6.9 6.3 Low  6.8  Albumin 4.8 4.5 4.4 4.4 4.7 4.1 4.4  AST 32 25 27 24 27 22 28   ALT 21 15 21 16 20 15 22   Alkaline Phosphatase 42 36 Low  35 Low  36 Low  39 36 Low  38  Total Bilirubin 0.6 0.6 R 0.5 R 0.6 R 0.5 R 0.4 R 0.4 R  GFR, Estimated 60 Low  54 Low  CM 51 Low  CM 50 Low  CM 49 Low  CM 48 Low  CM 44 Low  CM  Comment: (NOTE) Calculated  using the CKD-EPI Creatinine Equation (2021)  Anion gap 9 8 CM 7 CM 9 CM 10 CM 8 CM 6 CM  Comment: Performed at [facility], [address], [place], [place] [address]  Resulting Agency [facility] CL[facility] LAB [facility] CL[facility] LAB [facility] CL[facility] LAB        Specimen Collected: 07/09/23 09:29 Last Resulted: 07/09/23 10:03      EKG   Radiology DG Chest 2 View Result Date: 09/07/2023 CLINICAL DATA:  Cough and fever. EXAM: CHEST - 2 VIEW COMPARISON:  11/18/2010 FINDINGS: The heart size and mediastinal contours are within normal limits. New right middle lobe volume loss and consolidation is best appreciated on the lateral view. Left lung is clear. No pleural effusion. IMPRESSION: New right middle lobe volume loss and consolidation, suspicious for pneumonia. Recommend continued imaging follow-up with chest radiographs or CT. Electronically Signed   By: Marlyce Sine M.D.   On: 09/07/2023 10:20    Procedures Procedures (including critical care time)  Medications Ordered in UC Medications - No data to display  Initial Impression / Assessment and Plan / UC Course  I have reviewed the triage vital signs and the nursing notes.  Pertinent labs & imaging results that were available during my care of the patient were reviewed by me and considered in my medical decision making (see chart for details).  Clinical Course as of 09/07/23 1036  Sun Sep 07, 2023  1035 O2 93% RA on recheck [JM]    Clinical Course User Index [JM] Alleen Arbour, NP    Reviewed exam and symptoms with patient.  No red flags.  She is well-appearing and in no acute distress.  Negative rapid flu COVID and RSV.  Chest x-ray shows right middle lobe pneumonia.  Start azithromycin Augmentin, albuterol  inhaler, and Tessalon.  Discussed rest fluids and PCP follow-up 2 to 3 days for recheck.  Also instructed to have repeat chest x-ray  with her PCP in 4 weeks to confirm resolution of the  infection.  Strict ER precautions reviewed and patient verbalized understanding Final Clinical Impressions(s) / UC Diagnoses   Final diagnoses:  Acute cough  Pneumonia of right middle lobe due to infectious organism     Discharge Instructions      Your chest x-ray shows pneumonia.  Please start Augmentin twice daily for 7 days and Zithromycin daily for the next 5 days.  Albuterol  inhaler as needed for any wheezing or shortness of breath.  You may take Tessalon 3 times a day as needed for your cough.  Lots of rest and fluids.  Please follow-up with your PCP in 2 to 3 days for recheck.  Please go to the ER ASAP if you develop any worsening symptoms.  Also follow-up with your PCP in 4 weeks for repeat chest x-ray to confirm resolution of your pneumonia.  I hope you feel better soon!     ED Prescriptions     Medication Sig Dispense Auth. Provider   azithromycin (ZITHROMAX) 250 MG tablet Take 1 tablet (250 mg total) by mouth daily. Take first 2 tablets together, then 1 every day until finished. 6 tablet Rubi Tooley, Jodi R, NP   amoxicillin-clavulanate (AUGMENTIN) 875-125 MG tablet Take 1 tablet by mouth every 12 (twelve) hours. 14 tablet Shem Plemmons, Jodi R, NP   albuterol  (VENTOLIN  HFA) 108 (90 Base) MCG/ACT inhaler Inhale 1-2 puffs into the lungs every 6 (six) hours as needed for wheezing or shortness of breath. 1 each Loyd Salvador, Jodi R, NP   benzonatate (TESSALON) 100 MG capsule Take 1 capsule (100 mg total) by mouth every 8 (eight) hours. 21 capsule Analaura Messler, Jodi R, NP      PDMP not reviewed this encounter.   Alleen Arbour, NP 09/07/23 1037    Alleen Arbour, NP 09/07/23 1038

## 2023-09-07 NOTE — ED Triage Notes (Signed)
 Pt states had 102 fever last night. Pt c/o weakness, lethargic, productive cough w/green mucousxx4d.

## 2023-09-08 DIAGNOSIS — M1991 Primary osteoarthritis, unspecified site: Secondary | ICD-10-CM | POA: Diagnosis not present

## 2023-09-08 DIAGNOSIS — M25512 Pain in left shoulder: Secondary | ICD-10-CM | POA: Diagnosis not present

## 2023-09-08 DIAGNOSIS — M25511 Pain in right shoulder: Secondary | ICD-10-CM | POA: Diagnosis not present

## 2023-09-08 DIAGNOSIS — M810 Age-related osteoporosis without current pathological fracture: Secondary | ICD-10-CM | POA: Diagnosis not present

## 2023-09-08 DIAGNOSIS — M5459 Other low back pain: Secondary | ICD-10-CM | POA: Diagnosis not present

## 2023-09-08 DIAGNOSIS — Z6822 Body mass index (BMI) 22.0-22.9, adult: Secondary | ICD-10-CM | POA: Diagnosis not present

## 2023-09-12 DIAGNOSIS — J189 Pneumonia, unspecified organism: Secondary | ICD-10-CM | POA: Diagnosis not present

## 2023-09-12 DIAGNOSIS — R0602 Shortness of breath: Secondary | ICD-10-CM | POA: Diagnosis not present

## 2023-09-12 DIAGNOSIS — I10 Essential (primary) hypertension: Secondary | ICD-10-CM | POA: Diagnosis not present

## 2023-09-16 DIAGNOSIS — N182 Chronic kidney disease, stage 2 (mild): Secondary | ICD-10-CM | POA: Diagnosis not present

## 2023-09-16 DIAGNOSIS — N1831 Chronic kidney disease, stage 3a: Secondary | ICD-10-CM | POA: Diagnosis not present

## 2023-09-16 DIAGNOSIS — D8989 Other specified disorders involving the immune mechanism, not elsewhere classified: Secondary | ICD-10-CM | POA: Diagnosis not present

## 2023-09-26 ENCOUNTER — Other Ambulatory Visit: Payer: Self-pay

## 2023-09-26 DIAGNOSIS — C9000 Multiple myeloma not having achieved remission: Secondary | ICD-10-CM

## 2023-09-30 ENCOUNTER — Other Ambulatory Visit: Payer: Medicare Other

## 2023-10-01 DIAGNOSIS — D8989 Other specified disorders involving the immune mechanism, not elsewhere classified: Secondary | ICD-10-CM | POA: Diagnosis not present

## 2023-10-01 DIAGNOSIS — N182 Chronic kidney disease, stage 2 (mild): Secondary | ICD-10-CM | POA: Diagnosis not present

## 2023-10-01 DIAGNOSIS — R944 Abnormal results of kidney function studies: Secondary | ICD-10-CM | POA: Diagnosis not present

## 2023-10-03 ENCOUNTER — Telehealth: Payer: Self-pay

## 2023-10-03 NOTE — Telephone Encounter (Signed)
 Left message for pt to call our office and schedule IN OFFICE Preop appt.

## 2023-10-03 NOTE — Telephone Encounter (Signed)
   Pre-operative Risk Assessment    Patient Name: Traci [name]  DOB: April 10, 1951 MRN: [id number]   Date of last office visit: 08/26/22 Eilleen Grates, MD Date of next office visit: NONE   Request for Surgical Clearance    Procedure:  RIGHT REVERSE TOTAL SHOULDER ARTHROPLASTY  Date of Surgery:  Clearance TBD                                Surgeon:  DR Ernestina Headland SUPPLE Surgeon's Group or Practice Name:  [facility] Phone number:  [phone] Fax number:  [phone]  ATTN: Avanell Bob STONE   Type of Clearance Requested:   - Medical    Type of Anesthesia:  General    Additional requests/questions:    SignedCollin Deal   10/03/2023, 1:35 PM

## 2023-10-03 NOTE — Telephone Encounter (Signed)
   Name: Traci [name]  DOB: [birth year]  MRN: [id number]  Primary Cardiologist: Eilleen Grates, MD  Chart reviewed as part of pre-operative protocol coverage. Because of [name]'s past medical history and time since last visit, she will require a follow-up in-office visit in order to better assess preoperative cardiovascular risk. No follow up appt scheduled.  Pre-op covering staff: -  - Please contact requesting surgeon's office via preferred method (i.e, phone, fax) to inform them of need for appointment prior to surgery.     Friddie Jetty, NP  10/03/2023, 2:16 PM

## 2023-10-07 NOTE — Telephone Encounter (Signed)
 LVM asking pt to call our office to schedule office visit for preop clearance. Please reach out to preop team if pt returns call. Thank you

## 2023-10-07 NOTE — Telephone Encounter (Signed)
 PRE OP CELARANCE ; PT ASKED TO SEE DR. HOCHREIN, SAID SURGERY NOT UNTIL SEPT.   Pt tells me that her surgery is not being planned until sometime in Sept 2025. Pt asked to see Dr. Lavonne Prairie for the preop clearance.   I will update all parties involved.

## 2023-10-07 NOTE — Telephone Encounter (Signed)
 Patient is returning call.

## 2023-10-08 ENCOUNTER — Other Ambulatory Visit: Payer: Self-pay

## 2023-10-08 NOTE — Telephone Encounter (Signed)
 Me: Traci [name], I saw your notes sent to scheduling for appt sooner to Sept for preop clearance. I scheduled the pt already for the end of August 8/26.  Katy: thank you so much!!!  Me: I did that the other day, just wanted to let you know scheduling does not need to call the pt.  Katy: thanks

## 2023-10-10 ENCOUNTER — Other Ambulatory Visit: Payer: Self-pay

## 2023-10-13 ENCOUNTER — Ambulatory Visit: Payer: Medicare Other | Admitting: Hematology

## 2023-11-03 DIAGNOSIS — M25551 Pain in right hip: Secondary | ICD-10-CM | POA: Diagnosis not present

## 2023-11-03 DIAGNOSIS — M542 Cervicalgia: Secondary | ICD-10-CM | POA: Diagnosis not present

## 2023-11-04 DIAGNOSIS — M542 Cervicalgia: Secondary | ICD-10-CM | POA: Diagnosis not present

## 2023-11-04 DIAGNOSIS — M25551 Pain in right hip: Secondary | ICD-10-CM | POA: Diagnosis not present

## 2023-12-08 DIAGNOSIS — M810 Age-related osteoporosis without current pathological fracture: Secondary | ICD-10-CM | POA: Diagnosis not present

## 2023-12-19 ENCOUNTER — Inpatient Hospital Stay: Attending: Hematology

## 2023-12-19 DIAGNOSIS — D631 Anemia in chronic kidney disease: Secondary | ICD-10-CM | POA: Diagnosis not present

## 2023-12-19 DIAGNOSIS — Z801 Family history of malignant neoplasm of trachea, bronchus and lung: Secondary | ICD-10-CM | POA: Insufficient documentation

## 2023-12-19 DIAGNOSIS — I2510 Atherosclerotic heart disease of native coronary artery without angina pectoris: Secondary | ICD-10-CM | POA: Diagnosis not present

## 2023-12-19 DIAGNOSIS — Z800 Family history of malignant neoplasm of digestive organs: Secondary | ICD-10-CM | POA: Diagnosis not present

## 2023-12-19 DIAGNOSIS — Z885 Allergy status to narcotic agent status: Secondary | ICD-10-CM | POA: Insufficient documentation

## 2023-12-19 DIAGNOSIS — Z79899 Other long term (current) drug therapy: Secondary | ICD-10-CM | POA: Diagnosis not present

## 2023-12-19 DIAGNOSIS — M8580 Other specified disorders of bone density and structure, unspecified site: Secondary | ICD-10-CM | POA: Diagnosis not present

## 2023-12-19 DIAGNOSIS — Z8042 Family history of malignant neoplasm of prostate: Secondary | ICD-10-CM | POA: Insufficient documentation

## 2023-12-19 DIAGNOSIS — N189 Chronic kidney disease, unspecified: Secondary | ICD-10-CM | POA: Insufficient documentation

## 2023-12-19 DIAGNOSIS — Z9049 Acquired absence of other specified parts of digestive tract: Secondary | ICD-10-CM | POA: Diagnosis not present

## 2023-12-19 DIAGNOSIS — Z8249 Family history of ischemic heart disease and other diseases of the circulatory system: Secondary | ICD-10-CM | POA: Insufficient documentation

## 2023-12-19 DIAGNOSIS — C9000 Multiple myeloma not having achieved remission: Secondary | ICD-10-CM | POA: Diagnosis not present

## 2023-12-19 DIAGNOSIS — Z8616 Personal history of COVID-19: Secondary | ICD-10-CM | POA: Diagnosis not present

## 2023-12-19 DIAGNOSIS — I129 Hypertensive chronic kidney disease with stage 1 through stage 4 chronic kidney disease, or unspecified chronic kidney disease: Secondary | ICD-10-CM | POA: Insufficient documentation

## 2023-12-19 DIAGNOSIS — Z87891 Personal history of nicotine dependence: Secondary | ICD-10-CM | POA: Insufficient documentation

## 2023-12-19 DIAGNOSIS — Z853 Personal history of malignant neoplasm of breast: Secondary | ICD-10-CM | POA: Insufficient documentation

## 2023-12-19 DIAGNOSIS — Z833 Family history of diabetes mellitus: Secondary | ICD-10-CM | POA: Insufficient documentation

## 2023-12-19 DIAGNOSIS — Z9012 Acquired absence of left breast and nipple: Secondary | ICD-10-CM | POA: Insufficient documentation

## 2023-12-19 DIAGNOSIS — Z85828 Personal history of other malignant neoplasm of skin: Secondary | ICD-10-CM | POA: Diagnosis not present

## 2023-12-19 LAB — CMP (CANCER CENTER ONLY)
ALT: 16 U/L (ref 0–44)
AST: 27 U/L (ref 15–41)
Albumin: 4.6 g/dL (ref 3.5–5.0)
Alkaline Phosphatase: 41 U/L (ref 38–126)
Anion gap: 8 (ref 5–15)
BUN: 29 mg/dL — ABNORMAL HIGH (ref 8–23)
CO2: 25 mmol/L (ref 22–32)
Calcium: 9.4 mg/dL (ref 8.9–10.3)
Chloride: 107 mmol/L (ref 98–111)
Creatinine: 0.95 mg/dL (ref 0.44–1.00)
GFR, Estimated: >60 mL/min (ref >60)
Glucose, Bld: 94 mg/dL (ref 70–99)
Potassium: 4.3 mmol/L (ref 3.5–5.1)
Sodium: 140 mmol/L (ref 135–145)
Total Bilirubin: 0.5 mg/dL (ref 0.0–1.2)
Total Protein: 6.8 g/dL (ref 6.5–8.1)

## 2023-12-19 LAB — CBC WITH DIFFERENTIAL (CANCER CENTER ONLY)
Abs Immature Granulocytes: 0.01 K/uL (ref 0.00–0.07)
Basophils Absolute: 0.1 K/uL (ref 0.0–0.1)
Basophils Relative: 1 %
Eosinophils Absolute: 0.3 K/uL (ref 0.0–0.5)
Eosinophils Relative: 5 %
HCT: 36.5 % (ref 36.0–46.0)
Hemoglobin: 12.1 g/dL (ref 12.0–15.0)
Immature Granulocytes: 0 %
Lymphocytes Relative: 28 %
Lymphs Abs: 1.6 K/uL (ref 0.7–4.0)
MCH: 30.5 pg (ref 26.0–34.0)
MCHC: 33.2 g/dL (ref 30.0–36.0)
MCV: 91.9 fL (ref 80.0–100.0)
Monocytes Absolute: 0.5 K/uL (ref 0.1–1.0)
Monocytes Relative: 8 %
Neutro Abs: 3.2 K/uL (ref 1.7–7.7)
Neutrophils Relative %: 58 %
Platelet Count: 213 K/uL (ref 150–400)
RBC: 3.97 MIL/uL (ref 3.87–5.11)
RDW: 14.5 % (ref 11.5–15.5)
WBC Count: 5.6 K/uL (ref 4.0–10.5)
nRBC: 0 % (ref 0.0–0.2)

## 2023-12-22 LAB — KAPPA/LAMBDA LIGHT CHAINS
Kappa free light chain: 9.1 mg/L (ref 3.3–19.4)
Kappa, lambda light chain ratio: 0.8 (ref 0.26–1.65)
Lambda free light chains: 11.4 mg/L (ref 5.7–26.3)

## 2023-12-22 LAB — MULTIPLE MYELOMA PANEL, SERUM
Albumin SerPl Elph-Mcnc: 4.1 g/dL (ref 2.9–4.4)
Albumin/Glob SerPl: 1.6 (ref 0.7–1.7)
Alpha 1: 0.3 g/dL (ref 0.0–0.4)
Alpha2 Glob SerPl Elph-Mcnc: 0.8 g/dL (ref 0.4–1.0)
B-Globulin SerPl Elph-Mcnc: 1 g/dL (ref 0.7–1.3)
Gamma Glob SerPl Elph-Mcnc: 0.7 g/dL (ref 0.4–1.8)
Globulin, Total: 2.7 g/dL (ref 2.2–3.9)
IgA: 70 mg/dL (ref 64–422)
IgG (Immunoglobin G), Serum: 627 mg/dL (ref 586–1602)
IgM (Immunoglobulin M), Srm: 117 mg/dL (ref 26–217)
Total Protein ELP: 6.8 g/dL (ref 6.0–8.5)

## 2023-12-26 ENCOUNTER — Other Ambulatory Visit: Payer: Self-pay

## 2023-12-28 NOTE — Progress Notes (Unsigned)
  Cardiology Office Note:   Date:  12/30/2023  ID:  RANDALL RAMPERSAD, DOB [birth year], MRN [id number] PCP: Loreli Elsie JONETTA Mickey., MD  [facility] Providers Cardiologist:  Lynwood Schilling, MD {  History of Present Illness:   Traci [name] is a 73 y.o. female who presents for evaluation of hypertrophic cardiomyopathy. The MRI is consistent with moderate basal septal hypertrophy.  On echo in March 2025 the EF was still well-preserved.  There was moderate septal hypertrophy.  There was only grade 1 diastolic dysfunction.  There was no evidence of mitral valve regurgitation no mention of SAM.  She presents for follow-up.   She is going to have shoulder surgery.  The patient denies any new symptoms such as chest discomfort, neck or arm discomfort. There has been no new shortness of breath, PND or orthopnea. There have been no reported palpitations, presyncope or syncope.  She pedals a stationary bicycle and has no problems with this other than diffuse muscle aches which have not been clearly defined.  We have tried her off of statins before to see if this made a difference and it did not.  She has had some renal insufficiency followed by nephrology.  She has had a Light chain deposit disease.  ROS: As stated in the HPI and negative for all other systems.  Studies Reviewed:    EKG:   EKG Interpretation Date/Time:  Tuesday December 30 2023 13:30:03 EDT Ventricular Rate:  58 PR Interval:  174 QRS Duration:  86 QT Interval:  430 QTC Calculation: 422 R Axis:   79  Text Interpretation: Sinus bradycardia with Premature atrial complexes When compared with ECG of 18-Nov-2010 10:59, Premature ventricular complexes are no longer present Confirmed by Schilling Lynwood ([id number]) on 12/30/2023 1:38:42 PM    Risk Assessment/Calculations:              Physical Exam:   VS:  BP 118/76   Pulse (!) 58   Ht 5' 3 (1.6 m)   Wt 129 lb 3.2 oz (58.6 kg)   LMP 05/07/1999 (LMP Unknown)   SpO2 97%   BMI 22.89 kg/m     Wt Readings from Last 3 Encounters:  12/30/23 129 lb 3.2 oz (58.6 kg)  08/26/22 128 lb 3.2 oz (58.2 kg)  07/03/22 124 lb (56.2 kg)     GEN: Well nourished, well developed in no acute distress NECK: No JVD; No carotid bruits CARDIAC: RRR, no murmurs, rubs, gallops RESPIRATORY:  Clear to auscultation without rales, wheezing or rhonchi  ABDOMEN: Soft, non-tender, non-distended EXTREMITIES:  No edema; No deformity   ASSESSMENT AND PLAN:   HYPERTROPHIC CARDIOMYOPATHY -    This was stable on echo in March.  I will see her back in 1 year.  No change in therapy.  HYPERTENSION - The blood pressure is at target.  She will continue the meds as listed.   PREOP: The patient has no high risk symptoms or signs.  She is not going for high risk surgical procedure.  She has a very high functional level.  Therefore, based on ACC/AHA guidelines she is at acceptable risk for the planned surgery.  No further testing is needed.   Follow up with me in 1 year  Signed, Lynwood Schilling, MD

## 2023-12-30 ENCOUNTER — Encounter: Payer: Self-pay | Admitting: Cardiology

## 2023-12-30 ENCOUNTER — Ambulatory Visit: Attending: Cardiology | Admitting: Cardiology

## 2023-12-30 VITALS — BP 118/76 | HR 58 | Ht 63.0 in | Wt 129.2 lb

## 2023-12-30 DIAGNOSIS — Z01818 Encounter for other preprocedural examination: Secondary | ICD-10-CM | POA: Insufficient documentation

## 2023-12-30 DIAGNOSIS — I10 Essential (primary) hypertension: Secondary | ICD-10-CM | POA: Diagnosis not present

## 2023-12-30 DIAGNOSIS — I422 Other hypertrophic cardiomyopathy: Secondary | ICD-10-CM | POA: Diagnosis not present

## 2023-12-30 NOTE — Patient Instructions (Signed)
 Medication Instructions:  Your physician recommends that you continue on your current medications as directed. Please refer to the Current Medication list given to you today.  *If you need a refill on your cardiac medications before your next appointment, please call your pharmacy*  Lab Work: None ordered   Testing/Procedures: None ordered  Follow-Up: At [facility], you and your health needs are our priority.  As part of our continuing mission to provide you with exceptional heart care, our providers are all part of one team.  This team includes your primary Cardiologist (physician) and Advanced Practice Providers or APPs (Physician Assistants and Nurse Practitioners) who all work together to provide you with the care you need, when you need it.  Your next appointment:   1 year(s)  Provider:   Lynwood Schilling, MD    Thank you for choosing [facility]!!   [phone]

## 2023-12-31 ENCOUNTER — Inpatient Hospital Stay (HOSPITAL_BASED_OUTPATIENT_CLINIC_OR_DEPARTMENT_OTHER): Admitting: Hematology

## 2023-12-31 ENCOUNTER — Inpatient Hospital Stay: Admitting: Hematology

## 2023-12-31 DIAGNOSIS — R803 Bence Jones proteinuria: Secondary | ICD-10-CM | POA: Diagnosis not present

## 2023-12-31 DIAGNOSIS — D472 Monoclonal gammopathy: Secondary | ICD-10-CM | POA: Diagnosis not present

## 2023-12-31 DIAGNOSIS — C9000 Multiple myeloma not having achieved remission: Secondary | ICD-10-CM | POA: Diagnosis not present

## 2023-12-31 DIAGNOSIS — D649 Anemia, unspecified: Secondary | ICD-10-CM | POA: Diagnosis not present

## 2023-12-31 DIAGNOSIS — M8580 Other specified disorders of bone density and structure, unspecified site: Secondary | ICD-10-CM | POA: Diagnosis not present

## 2023-12-31 DIAGNOSIS — I2510 Atherosclerotic heart disease of native coronary artery without angina pectoris: Secondary | ICD-10-CM | POA: Diagnosis not present

## 2023-12-31 DIAGNOSIS — D8989 Other specified disorders involving the immune mechanism, not elsewhere classified: Secondary | ICD-10-CM

## 2023-12-31 DIAGNOSIS — D631 Anemia in chronic kidney disease: Secondary | ICD-10-CM | POA: Diagnosis not present

## 2023-12-31 DIAGNOSIS — N189 Chronic kidney disease, unspecified: Secondary | ICD-10-CM | POA: Diagnosis not present

## 2023-12-31 DIAGNOSIS — I129 Hypertensive chronic kidney disease with stage 1 through stage 4 chronic kidney disease, or unspecified chronic kidney disease: Secondary | ICD-10-CM | POA: Diagnosis not present

## 2024-01-02 ENCOUNTER — Other Ambulatory Visit: Payer: Self-pay

## 2024-01-06 ENCOUNTER — Encounter: Payer: Self-pay | Admitting: Hematology

## 2024-01-06 NOTE — Progress Notes (Signed)
 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 is a 73 y.o. 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   I connected with Traci [name] on .12/31/2023  at  8:40 AM EDT by telephone visit and verified that I am speaking with the correct person using two identifiers.  I discussed the limitations, risks, security and privacy concerns of performing an evaluation and management service by telemedicine and the availability of in-person appointments. I also discussed with the patient that there may be a patient responsible charge related to this service. The patient expressed understanding and agreed to proceed.   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  Patient notes no acute new symptoms since her last phone visit.  No fevers no chills no night sweats.  No new focal bone pains.  No new fatigue, bleeding issues or infections. Labs done on 12/19/2023 were discussed with her in details.  MEDICAL HISTORY:  Past Medical History:  Diagnosis Date   ASHD (arteriosclerotic heart disease)    Mild septal hypertrophy, 2D ECHO 11/2006, Dr. Lavona   Breast cancer [facility])    PMH of, Dr. Maude Salt, Dr. Melodye  multilpe sites overlaping, ER +   Facet syndrome, lumbar     Dr. Gaspar   History of COVID-19 05/07/2019   Hyperlipidemia    Hypertension    Osteopenia    Skin cancer, basal cell    Dr. Amy [place]     SURGICAL HISTORY: Past Surgical History:  Procedure Laterality Date   CATARACT EXTRACTION Right 06/2020   CHOLECYSTECTOMY  about 1993   COLONOSCOPY      X 3 negative (last 2008, due 2015), Dr. Obie   MASTECTOMY Left 11/2004   Dr. Maude Salt, also reduction of right breast and reconstruction on the left.   ROTATOR CUFF REPAIR Left    TONSILLECTOMY     TRANSESOPHAGEAL ECHOCARDIOGRAM  2012   Overall left ventricular systolic function was normal. Left ventricular ejection fraction was estimated, range being 55% to 60%. There were no left ventricular regional wall motional abnormalities. Left ventricular wall thickness was mildly increased. There was mild focal basal septal hypertrophy.   WISDOM TOOTH EXTRACTION      SOCIAL HISTORY: Social History   Socioeconomic History   Marital status: Married    Spouse name: Not on file   Number of children: Not on file   Years of education: Not on file   Highest education level: Not on file  Occupational History   Occupation: Retired    Associate Professor: UNEMPLOYED  Tobacco Use   Smoking status: Former    Current packs/day: 0.00    Average packs/day: 1 pack/day for 10.0 years (10.0 ttl pk-yrs)    Types: Cigarettes    Start date: 05/06/1968    Quit date: 05/06/1978    Years since quitting: 45.7  Smokeless tobacco: Never   Tobacco comments:    smoked 1970- 1980, up to 1 ppd  Vaping Use   Vaping status: Never Used  Substance and Sexual Activity   Alcohol  use: Yes    Alcohol /week: 4.0 standard drinks of alcohol     Types: 4 Glasses of wine per week   Drug use: No   Sexual activity: Yes    Partners: Male    Birth control/protection: Post-menopausal  Other Topics Concern   Not on file  Social History Narrative   Married   Regular exercise: Yes, high level CVE, walks 2 mpd 5x/ week & gym   No  specific diet   Social Drivers of Health   Financial Resource Strain: Low Risk  (04/25/2021)   Received from [facility]   Overall Financial Resource Strain (CARDIA)    Difficulty of Paying Living Expenses: Not hard at all  Food Insecurity: No Food Insecurity (04/25/2021)   Received from [facility]   Hunger Vital Sign    Within the past 12 months, you worried that your food would run out before you got the money to buy more.: Never true    Within the past 12 months, the food you bought just didn't last and you didn't have money to get more.: Never true  Transportation Needs: No Transportation Needs (04/25/2021)   Received from [facility]   PRAPARE - Transportation    Lack of Transportation (Medical): No    Lack of Transportation (Non-Medical): No  Physical Activity: Not on file  Stress: No Stress Concern Present (04/25/2021)   Received from [facility] - Occupational Stress Questionnaire    Feeling of Stress : Not at all  Social Connections: Not on file  Intimate Partner Violence: Not on file    FAMILY HISTORY: Family History  Problem Relation Age of Onset   Hypertension Mother    Coronary artery disease Mother        no MI   Lung cancer Mother        smoker   Colon cancer Father 56   Coronary artery disease Father        no MI   Prostate cancer Father    Diabetes Father    Hypertension Sister    Cancer Maternal Grandfather        GI   Diabetes Paternal Grandfather    Stroke Neg Hx    Esophageal cancer Neg Hx    Stomach cancer Neg Hx    Rectal cancer Neg Hx     ALLERGIES:  is allergic to shellfish allergy, codeine, hydrocodone, and vicodin [hydrocodone-acetaminophen ].  MEDICATIONS:  Current Outpatient Medications  Medication Sig Dispense Refill   albuterol  (VENTOLIN  HFA) 108 (90 Base) MCG/ACT inhaler Inhale 1-2 puffs into the lungs every 6 (six) hours as needed for wheezing or shortness of breath. 1  each 0   amLODipine  (NORVASC ) 10 MG tablet Take 1 tablet (10 mg total) by mouth daily. 30 tablet 0   amoxicillin -clavulanate (AUGMENTIN ) 875-125 MG tablet Take 1 tablet by mouth every 12 (twelve) hours. 14 tablet 0   azithromycin  (ZITHROMAX ) 250 MG tablet Take 1 tablet (250 mg total) by mouth daily. Take first 2 tablets together, then 1 every day until finished. 6 tablet 0   B Complex Vitamins (VITAMIN B COMPLEX) CAPS TAKE 1 CAPSULE BY MOUTH EVERY DAY 100 capsule 1   benazepril  (LOTENSIN ) 40 MG tablet Take 1 tablet (40 mg total)  by mouth daily. 30 tablet 0   benzonatate  (TESSALON ) 100 MG capsule Take 1 capsule (100 mg total) by mouth every 8 (eight) hours. 21 capsule 0   cholecalciferol (VITAMIN D3) 25 MCG (1000 UNIT) tablet Take 2 tablets (2,000 Units total) by mouth daily. 60 tablet 11   denosumab  (PROLIA ) 60 MG/ML SOLN injection Inject 60 mg into the skin every 6 (six) months. Administer in upper arm, thigh, or abdomen     iron  polysaccharides (NIFEREX) 150 MG capsule Take 1 capsule (150 mg total) by mouth daily. 30 capsule 2   magnesium oxide (MAG-OX) 400 MG tablet Take by mouth daily.     rosuvastatin (CRESTOR) 10 MG tablet Take 10 mg by mouth daily. (Patient taking differently: Take 20 mg by mouth daily.)     No current facility-administered medications for this visit.    REVIEW OF SYSTEMS:   .10 Point review of Systems was done is negative except as noted above.  PHYSICAL EXAMINATION :  Telemedicine visit  LABORATORY DATA:  I have reviewed the data as listed  .    Latest Ref Rng & Units 12/19/2023    9:11 AM 07/09/2023    9:29 AM 04/14/2023    9:08 AM  CBC  WBC 4.0 - 10.5 K/uL 5.6  6.1  5.4   Hemoglobin 12.0 - 15.0 g/dL 87.8  87.1  87.4   Hematocrit 36.0 - 46.0 % 36.5  38.4  37.2   Platelets 150 - 400 K/uL 213  206  224   .CBC    Component Value Date/Time   WBC 5.6 12/19/2023 0911   WBC 5.2 02/25/2022 1240   RBC 3.97 12/19/2023 0911   HGB 12.1 12/19/2023 0911   HCT 36.5  12/19/2023 0911   PLT 213 12/19/2023 0911   MCV 91.9 12/19/2023 0911   MCH 30.5 12/19/2023 0911   MCHC 33.2 12/19/2023 0911   RDW 14.5 12/19/2023 0911   LYMPHSABS 1.6 12/19/2023 0911   MONOABS 0.5 12/19/2023 0911   EOSABS 0.3 12/19/2023 0911   BASOSABS 0.1 12/19/2023 0911       Latest Ref Rng & Units 12/19/2023    9:11 AM 07/09/2023    9:29 AM 04/14/2023    9:08 AM  CMP  Glucose 70 - 99 mg/dL 94  99  99   BUN 8 - 23 mg/dL 29  23  19    Creatinine 0.44 - 1.00 mg/dL 9.04  9.00  8.91   Sodium 135 - 145 mmol/L 140  140  138   Potassium 3.5 - 5.1 mmol/L 4.3  3.8  3.7   Chloride 98 - 111 mmol/L 107  103  104   CO2 22 - 32 mmol/L 25  28  26    Calcium 8.9 - 10.3 mg/dL 9.4  9.9  9.3   Total Protein 6.5 - 8.1 g/dL 6.8  7.3  6.9   Total Bilirubin 0.0 - 1.2 mg/dL 0.5  0.6  0.6   Alkaline Phos 38 - 126 U/L 41  42  36   AST 15 - 41 U/L 27  32  25   ALT 0 - 44 U/L 16  21  15           Surgical Pathology  CASE: [id number]  PATIENT: Nakaila Riker  Bone Marrow Report    Clinical History: Multiple myeloma , Bence-jones proteinuria  (BH)   DIAGNOSIS:   BONE MARROW, ASPIRATE, CLOT, CORE:  -Normocellular bone marrow with plasma cell neoplasm  -See comment  PERIPHERAL BLOOD:  -Normocytic-normochromic anemia   COMMENT:   The bone marrow is generally normocellular for age with trilineage  hematopoiesis.  In this background, the plasma cells are increased in  number representing 12% of cells associated with small clusters in the  clot and biopsy sections.  The plasma cells display lambda light chain  restriction consistent with plasma cell neoplasm.  Correlation with  cytogenetic and FISH studies is recommended.     RADIOGRAPHIC STUDIES: I have personally reviewed the radiological images as listed and agreed with the findings in the report. No results found.  ASSESSMENT & PLAN:   73 year old female with history of hypertrophic cardiomyopathy, hypertension, dyslipidemia  with  #1  Smoldering myeloma with renal LCDD Bone marrow biopsy with 12% lambda restricted plasma cells PET CT scan with no hypermetabolic bone lesions Cytogenetics normal female karyotype Molecular cytogenetics atypical t (11;14) Component     Latest Ref Rng & Units 01/30/2021  IgG (Immunoglobin G), Serum     586 - 1,602 mg/dL 389  IgA     64 - 577 mg/dL 91  IgM (Immunoglobulin M), Srm     26 - 217 mg/dL 79  Total Protein ELP     6.0 - 8.5 g/dL 6.8  Albumin SerPl Elph-Mcnc     2.9 - 4.4 g/dL 4.3  Alpha 1     0.0 - 0.4 g/dL 0.2  Alpha2 Glob SerPl Elph-Mcnc     0.4 - 1.0 g/dL 0.7  B-Globulin SerPl Elph-Mcnc     0.7 - 1.3 g/dL 1.0  Gamma Glob SerPl Elph-Mcnc     0.4 - 1.8 g/dL 0.6  M Protein SerPl Elph-Mcnc     Not Observed g/dL Not Observed  Globulin, Total     2.2 - 3.9 g/dL 2.5  Albumin/Glob SerPl     0.7 - 1.7 1.8 (H)  IFE 1      Comment  Please Note (HCV):      Comment  Kappa free light chain     3.3 - 19.4 mg/L 11.8  Lambda free light chains     5.7 - 26.3 mg/L 480.8 (H)  Kappa, lambda light chain ratio     0.26 - 1.65 0.02 (L)   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  History of hypertension, dyslipidemia #5  Normocytic anemia with some worsening with hemoglobin down to 8.9 likely from Cytoxan .  Previous mild anemia likely related to chronic kidney disease and plasma cell dyscrasia.  Plan Discussed lab results from 12/19/2023 in detail with the patient CBC shows normal hemoglobin of 12.1 with normal WBC count of 5.6k and normal platelets of 213k CMP is stable with normal creatinine of 0.95 normal calcium level of 9.4. 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 No acute new symptoms to suggest progression of her plasma cell dyscrasia. Will continue to monitor labs every 4 to 6 months. No indication for reinitiating treatment for her plasma cell dyscrasia at this time. She was again recommended to drink at least 64 ounces of water daily. She was recommended  to follow-up with PCP for age-appropriate cancer screening and vaccinations. -Continue vitamin D   Follow-up  Labs in 26 weeks RTC with Dr Onesimo in 28 weeks  .The total time spent in the appointment was 20 minutes* .  All of the patient's questions were answered with apparent satisfaction. The patient knows to call the clinic with any problems, questions or concerns.   Emaline Onesimo MD MS AAHIVMS [facility] Hematology/Oncology Physician [facility]  .*Total Encounter Time as defined by the Centers for Medicare and Medicaid Services includes, in addition to the face-to-face time of a patient visit (documented in the note above) non-face-to-face time: obtaining and reviewing outside history, ordering and reviewing medications, tests or procedures, care coordination (communications with other health care professionals or caregivers) and documentation in the medical record.

## 2024-01-13 ENCOUNTER — Encounter: Payer: Self-pay | Admitting: Cardiology

## 2024-01-13 ENCOUNTER — Encounter: Payer: Self-pay | Admitting: Hematology

## 2024-01-13 DIAGNOSIS — M19012 Primary osteoarthritis, left shoulder: Secondary | ICD-10-CM | POA: Diagnosis not present

## 2024-01-13 DIAGNOSIS — M19011 Primary osteoarthritis, right shoulder: Secondary | ICD-10-CM | POA: Diagnosis not present

## 2024-01-13 NOTE — Telephone Encounter (Signed)
 fyi

## 2024-01-14 ENCOUNTER — Other Ambulatory Visit: Payer: Self-pay | Admitting: Orthopaedic Surgery

## 2024-01-14 ENCOUNTER — Telehealth: Payer: Self-pay

## 2024-01-14 DIAGNOSIS — G8929 Other chronic pain: Secondary | ICD-10-CM

## 2024-01-14 NOTE — Telephone Encounter (Signed)
   Pre-operative Risk Assessment    Patient Name: Traci [name]  DOB: [birth year] MRN: [id number]   Date of last office visit: 12/30/23 LYNWOOD SCHILLING, MD Date of next office visit: NONE   Request for Surgical Clearance    Procedure:  RIGHT REVERSE TOTAL SHOULDER ARTHROPLASTY  Date of Surgery:  Clearance TBD                                Surgeon:  BONNER HAIR, MD Surgeon's Group or Practice Name:  [facility] Phone number:  [phone]   EXT [id number] Fax number:  [phone]   ATTN: MAEOLA DIVERS   Type of Clearance Requested:   - Medical    Type of Anesthesia:  General WITH INTERSCALENE   Additional requests/questions:    SignedLucie DELENA Ku   01/14/2024, 9:49 AM

## 2024-01-14 NOTE — Telephone Encounter (Signed)
   Patient Name: Traci [name]  DOB: March 08, 1951 MRN: [id number]  Primary Cardiologist: Lynwood Schilling, MD  Chart reviewed as part of pre-operative protocol coverage. Given past medical history and time since last visit, based on ACC/AHA guidelines, SHARNE LINDERS is at acceptable risk for the planned procedure without further cardiovascular testing.  Per Dr. Schilling 12/30/2023 PREOP: The patient has no high risk symptoms or signs.  She is not going for high risk surgical procedure.  She has a very high functional level.  Therefore, based on ACC/AHA guidelines she is at acceptable risk for the planned surgery.  No further testing is needed.    The patient was advised that if she develops new symptoms prior to surgery to contact our office to arrange for a follow-up visit, and she verbalized understanding.  I will route this recommendation to the requesting party via Epic fax function and remove from pre-op pool.  Please call with questions.  Lamarr Satterfield, NP 01/14/2024, 10:07 AM

## 2024-01-21 ENCOUNTER — Ambulatory Visit
Admission: RE | Admit: 2024-01-21 | Discharge: 2024-01-21 | Disposition: A | Discharge status: Home / Self Care | Source: Ambulatory Visit | Attending: Orthopaedic Surgery | Admitting: Orthopaedic Surgery

## 2024-01-21 DIAGNOSIS — M19011 Primary osteoarthritis, right shoulder: Secondary | ICD-10-CM | POA: Diagnosis not present

## 2024-01-21 DIAGNOSIS — G8929 Other chronic pain: Secondary | ICD-10-CM

## 2024-01-22 ENCOUNTER — Other Ambulatory Visit

## 2024-01-22 DIAGNOSIS — M4316 Spondylolisthesis, lumbar region: Secondary | ICD-10-CM | POA: Diagnosis not present

## 2024-01-23 ENCOUNTER — Other Ambulatory Visit: Payer: Self-pay | Admitting: Neurosurgery

## 2024-01-23 ENCOUNTER — Encounter: Payer: Self-pay | Admitting: Neurosurgery

## 2024-01-23 DIAGNOSIS — M4316 Spondylolisthesis, lumbar region: Secondary | ICD-10-CM

## 2024-01-26 ENCOUNTER — Encounter: Payer: Self-pay | Admitting: Hematology

## 2024-02-03 DIAGNOSIS — H2512 Age-related nuclear cataract, left eye: Secondary | ICD-10-CM | POA: Diagnosis not present

## 2024-02-04 DIAGNOSIS — M65911 Unspecified synovitis and tenosynovitis, right shoulder: Secondary | ICD-10-CM | POA: Diagnosis not present

## 2024-02-04 DIAGNOSIS — M25711 Osteophyte, right shoulder: Secondary | ICD-10-CM | POA: Diagnosis not present

## 2024-02-04 DIAGNOSIS — M19011 Primary osteoarthritis, right shoulder: Secondary | ICD-10-CM | POA: Diagnosis not present

## 2024-02-04 DIAGNOSIS — G8918 Other acute postprocedural pain: Secondary | ICD-10-CM | POA: Diagnosis not present

## 2024-02-09 DIAGNOSIS — M19011 Primary osteoarthritis, right shoulder: Secondary | ICD-10-CM | POA: Diagnosis not present

## 2024-02-10 DIAGNOSIS — Z96611 Presence of right artificial shoulder joint: Secondary | ICD-10-CM | POA: Diagnosis not present

## 2024-02-16 ENCOUNTER — Other Ambulatory Visit (HOSPITAL_COMMUNITY)

## 2024-02-16 DIAGNOSIS — M19011 Primary osteoarthritis, right shoulder: Secondary | ICD-10-CM | POA: Diagnosis not present

## 2024-02-23 DIAGNOSIS — M19011 Primary osteoarthritis, right shoulder: Secondary | ICD-10-CM | POA: Diagnosis not present

## 2024-02-26 ENCOUNTER — Ambulatory Visit (HOSPITAL_COMMUNITY): Admit: 2024-02-26 | Admitting: Orthopedic Surgery

## 2024-02-26 SURGERY — ARTHROPLASTY, SHOULDER, TOTAL, REVERSE
Anesthesia: General | Site: Shoulder | Laterality: Right

## 2024-03-02 ENCOUNTER — Other Ambulatory Visit: Payer: Self-pay

## 2024-03-05 DIAGNOSIS — M19011 Primary osteoarthritis, right shoulder: Secondary | ICD-10-CM | POA: Diagnosis not present

## 2024-03-05 DIAGNOSIS — Z96611 Presence of right artificial shoulder joint: Secondary | ICD-10-CM | POA: Diagnosis not present

## 2024-03-05 DIAGNOSIS — Z471 Aftercare following joint replacement surgery: Secondary | ICD-10-CM | POA: Diagnosis not present

## 2024-03-08 ENCOUNTER — Ambulatory Visit
Admission: RE | Admit: 2024-03-08 | Discharge: 2024-03-08 | Disposition: A | Discharge status: Home / Self Care | Source: Ambulatory Visit | Attending: Neurosurgery | Admitting: Neurosurgery

## 2024-03-08 DIAGNOSIS — M4316 Spondylolisthesis, lumbar region: Secondary | ICD-10-CM

## 2024-03-08 DIAGNOSIS — M47816 Spondylosis without myelopathy or radiculopathy, lumbar region: Secondary | ICD-10-CM | POA: Diagnosis not present

## 2024-03-08 DIAGNOSIS — M48061 Spinal stenosis, lumbar region without neurogenic claudication: Secondary | ICD-10-CM | POA: Diagnosis not present

## 2024-03-09 DIAGNOSIS — Z23 Encounter for immunization: Secondary | ICD-10-CM | POA: Diagnosis not present

## 2024-03-09 DIAGNOSIS — Z1231 Encounter for screening mammogram for malignant neoplasm of breast: Secondary | ICD-10-CM | POA: Diagnosis not present

## 2024-03-09 DIAGNOSIS — M19011 Primary osteoarthritis, right shoulder: Secondary | ICD-10-CM | POA: Diagnosis not present

## 2024-03-22 ENCOUNTER — Other Ambulatory Visit: Payer: Self-pay

## 2024-03-22 DIAGNOSIS — D472 Monoclonal gammopathy: Secondary | ICD-10-CM

## 2024-03-22 DIAGNOSIS — M19011 Primary osteoarthritis, right shoulder: Secondary | ICD-10-CM | POA: Diagnosis not present

## 2024-03-23 ENCOUNTER — Inpatient Hospital Stay: Attending: Hematology

## 2024-03-23 DIAGNOSIS — C9000 Multiple myeloma not having achieved remission: Secondary | ICD-10-CM | POA: Diagnosis not present

## 2024-03-23 DIAGNOSIS — D472 Monoclonal gammopathy: Secondary | ICD-10-CM

## 2024-03-23 DIAGNOSIS — Z79899 Other long term (current) drug therapy: Secondary | ICD-10-CM | POA: Insufficient documentation

## 2024-03-23 LAB — CMP (CANCER CENTER ONLY)
ALT: 17 U/L (ref 0–44)
AST: 29 U/L (ref 15–41)
Albumin: 4.5 g/dL (ref 3.5–5.0)
Alkaline Phosphatase: 43 U/L (ref 38–126)
Anion gap: 8 (ref 5–15)
BUN: 21 mg/dL (ref 8–23)
CO2: 26 mmol/L (ref 22–32)
Calcium: 9.6 mg/dL (ref 8.9–10.3)
Chloride: 103 mmol/L (ref 98–111)
Creatinine: 1.02 mg/dL — ABNORMAL HIGH (ref 0.44–1.00)
GFR, Estimated: 57 mL/min — ABNORMAL LOW (ref >60)
Glucose, Bld: 101 mg/dL — ABNORMAL HIGH (ref 70–99)
Potassium: 4.1 mmol/L (ref 3.5–5.1)
Sodium: 137 mmol/L (ref 135–145)
Total Bilirubin: 0.5 mg/dL (ref 0.0–1.2)
Total Protein: 7 g/dL (ref 6.5–8.1)

## 2024-03-23 LAB — CBC WITH DIFFERENTIAL (CANCER CENTER ONLY)
Abs Immature Granulocytes: 0.02 K/uL (ref 0.00–0.07)
Basophils Absolute: 0.1 K/uL (ref 0.0–0.1)
Basophils Relative: 1 %
Eosinophils Absolute: 0.2 K/uL (ref 0.0–0.5)
Eosinophils Relative: 4 %
HCT: 35.9 % — ABNORMAL LOW (ref 36.0–46.0)
Hemoglobin: 11.9 g/dL — ABNORMAL LOW (ref 12.0–15.0)
Immature Granulocytes: 0 %
Lymphocytes Relative: 35 %
Lymphs Abs: 2.1 K/uL (ref 0.7–4.0)
MCH: 30.2 pg (ref 26.0–34.0)
MCHC: 33.1 g/dL (ref 30.0–36.0)
MCV: 91.1 fL (ref 80.0–100.0)
Monocytes Absolute: 0.6 K/uL (ref 0.1–1.0)
Monocytes Relative: 9 %
Neutro Abs: 3.1 K/uL (ref 1.7–7.7)
Neutrophils Relative %: 51 %
Platelet Count: 248 K/uL (ref 150–400)
RBC: 3.94 MIL/uL (ref 3.87–5.11)
RDW: 14.8 % (ref 11.5–15.5)
WBC Count: 6.1 K/uL (ref 4.0–10.5)
nRBC: 0 % (ref 0.0–0.2)

## 2024-03-24 LAB — KAPPA/LAMBDA LIGHT CHAINS
Kappa free light chain: 12.4 mg/L (ref 3.3–19.4)
Kappa, lambda light chain ratio: 1.02 (ref 0.26–1.65)
Lambda free light chains: 12.2 mg/L (ref 5.7–26.3)

## 2024-03-26 LAB — MULTIPLE MYELOMA PANEL, SERUM
Albumin SerPl Elph-Mcnc: 4 g/dL (ref 2.9–4.4)
Albumin/Glob SerPl: 1.4 (ref 0.7–1.7)
Alpha 1: 0.2 g/dL (ref 0.0–0.4)
Alpha2 Glob SerPl Elph-Mcnc: 0.8 g/dL (ref 0.4–1.0)
B-Globulin SerPl Elph-Mcnc: 1.1 g/dL (ref 0.7–1.3)
Gamma Glob SerPl Elph-Mcnc: 0.8 g/dL (ref 0.4–1.8)
Globulin, Total: 2.9 g/dL (ref 2.2–3.9)
IgA: 106 mg/dL (ref 64–422)
IgG (Immunoglobin G), Serum: 789 mg/dL (ref 586–1602)
IgM (Immunoglobulin M), Srm: 138 mg/dL (ref 26–217)
Total Protein ELP: 6.9 g/dL (ref 6.0–8.5)

## 2024-03-31 DIAGNOSIS — M19011 Primary osteoarthritis, right shoulder: Secondary | ICD-10-CM | POA: Diagnosis not present

## 2024-04-05 DIAGNOSIS — M19011 Primary osteoarthritis, right shoulder: Secondary | ICD-10-CM | POA: Diagnosis not present

## 2024-04-06 DIAGNOSIS — N182 Chronic kidney disease, stage 2 (mild): Secondary | ICD-10-CM | POA: Diagnosis not present

## 2024-04-06 DIAGNOSIS — D8989 Other specified disorders involving the immune mechanism, not elsewhere classified: Secondary | ICD-10-CM | POA: Diagnosis not present

## 2024-04-13 DIAGNOSIS — M19011 Primary osteoarthritis, right shoulder: Secondary | ICD-10-CM | POA: Diagnosis not present

## 2024-04-15 DIAGNOSIS — M4316 Spondylolisthesis, lumbar region: Secondary | ICD-10-CM | POA: Diagnosis not present

## 2024-04-16 DIAGNOSIS — M7122 Synovial cyst of popliteal space [Baker], left knee: Secondary | ICD-10-CM | POA: Diagnosis not present

## 2024-04-16 DIAGNOSIS — M170 Bilateral primary osteoarthritis of knee: Secondary | ICD-10-CM | POA: Diagnosis not present

## 2024-04-20 DIAGNOSIS — M19011 Primary osteoarthritis, right shoulder: Secondary | ICD-10-CM | POA: Diagnosis not present

## 2024-07-02 ENCOUNTER — Other Ambulatory Visit

## 2024-07-05 ENCOUNTER — Inpatient Hospital Stay: Attending: Hematology

## 2024-07-16 ENCOUNTER — Ambulatory Visit: Admitting: Hematology

## 2024-07-19 ENCOUNTER — Inpatient Hospital Stay: Admitting: Hematology
